# Patient Record
Sex: Female | Born: 1955
Health system: Southern US, Community
[De-identification: ages and names within clinical notes are randomized; demographics above are authoritative.]

## PROBLEM LIST (undated history)

## (undated) DIAGNOSIS — F419 Anxiety disorder, unspecified: Secondary | ICD-10-CM

## (undated) DIAGNOSIS — I5181 Takotsubo syndrome: Secondary | ICD-10-CM

## (undated) DIAGNOSIS — N200 Calculus of kidney: Secondary | ICD-10-CM

## (undated) DIAGNOSIS — N390 Urinary tract infection, site not specified: Secondary | ICD-10-CM

## (undated) DIAGNOSIS — I219 Acute myocardial infarction, unspecified: Secondary | ICD-10-CM

## (undated) DIAGNOSIS — C439 Malignant melanoma of skin, unspecified: Secondary | ICD-10-CM

## (undated) DIAGNOSIS — N393 Stress incontinence (female) (male): Secondary | ICD-10-CM

## (undated) DIAGNOSIS — J189 Pneumonia, unspecified organism: Secondary | ICD-10-CM

## (undated) DIAGNOSIS — M1990 Unspecified osteoarthritis, unspecified site: Secondary | ICD-10-CM

## (undated) DIAGNOSIS — Z720 Tobacco use: Secondary | ICD-10-CM

## (undated) DIAGNOSIS — M272 Inflammatory conditions of jaws: Secondary | ICD-10-CM

## (undated) HISTORY — PX: WRIST SURGERY: SHX841

## (undated) HISTORY — PX: ABDOMINAL HYSTERECTOMY: SHX81

## (undated) HISTORY — PX: CERVICAL DISC SURGERY: SHX588

## (undated) HISTORY — DX: Urinary tract infection, site not specified: N39.0

## (undated) HISTORY — PX: UMBILICAL HERNIA REPAIR: SHX196

## (undated) HISTORY — PX: BACK SURGERY: SHX140

## (undated) HISTORY — PX: NECK SURGERY: SHX720

## (undated) HISTORY — PX: SKIN CANCER EXCISION: SHX779

## (undated) HISTORY — PX: INGUINAL HERNIA REPAIR: SUR1180

## (undated) HISTORY — PX: LUMBAR FUSION: SHX111

## (undated) HISTORY — PX: OTHER SURGICAL HISTORY: SHX169

## (undated) HISTORY — PX: CARPAL TUNNEL RELEASE: SHX101

## (undated) HISTORY — DX: Calculus of kidney: N20.0

## (undated) HISTORY — PX: MELANOMA EXCISION: SHX5266

## (undated) HISTORY — PX: MANDIBLE SURGERY: SHX707

## (undated) HISTORY — DX: Acute myocardial infarction, unspecified: I21.9

## (undated) HISTORY — DX: Malignant melanoma of skin, unspecified: C43.9

## (undated) HISTORY — DX: Inflammatory conditions of jaws: M27.2

## (undated) HISTORY — DX: Stress incontinence (female) (male): N39.3

## (undated) HISTORY — DX: Unspecified osteoarthritis, unspecified site: M19.90

## (undated) HISTORY — DX: Takotsubo syndrome: I51.81

## (undated) HISTORY — PX: TRIGGER FINGER RELEASE: SHX641

## (undated) HISTORY — DX: Pneumonia, unspecified organism: J18.9

---

## 2006-03-15 ENCOUNTER — Encounter: Admission: RE | Admit: 2006-03-15 | Discharge: 2006-03-15 | Payer: Self-pay | Admitting: Orthopaedic Surgery

## 2008-02-01 ENCOUNTER — Encounter: Admission: RE | Admit: 2008-02-01 | Discharge: 2008-02-01 | Payer: Self-pay | Admitting: Orthopaedic Surgery

## 2008-04-01 ENCOUNTER — Encounter: Admission: RE | Admit: 2008-04-01 | Discharge: 2008-04-01 | Payer: Self-pay | Admitting: Orthopaedic Surgery

## 2009-06-12 ENCOUNTER — Encounter: Admission: RE | Admit: 2009-06-12 | Discharge: 2009-06-12 | Payer: Self-pay | Admitting: Anesthesiology

## 2010-03-14 HISTORY — PX: NOSE SURGERY: SHX723

## 2010-04-14 ENCOUNTER — Other Ambulatory Visit: Payer: Self-pay | Admitting: Orthopedic Surgery

## 2010-04-14 DIAGNOSIS — M542 Cervicalgia: Secondary | ICD-10-CM

## 2010-04-16 ENCOUNTER — Ambulatory Visit
Admission: RE | Admit: 2010-04-16 | Discharge: 2010-04-16 | Disposition: A | Discharge status: Home / Self Care | Payer: 59 | Source: Ambulatory Visit | Attending: Orthopedic Surgery | Admitting: Orthopedic Surgery

## 2010-04-16 DIAGNOSIS — M542 Cervicalgia: Secondary | ICD-10-CM

## 2010-04-16 MED ORDER — GADOBENATE DIMEGLUMINE 529 MG/ML IV SOLN
14.0000 mL | Freq: Once | INTRAVENOUS | Status: AC | PRN
  Administered 2010-04-16: 14 mL via INTRAVENOUS

## 2011-10-11 DIAGNOSIS — M542 Cervicalgia: Secondary | ICD-10-CM

## 2011-10-11 HISTORY — DX: Cervicalgia: M54.2

## 2011-11-10 DIAGNOSIS — Z8522 Personal history of malignant neoplasm of nasal cavities, middle ear, and accessory sinuses: Secondary | ICD-10-CM | POA: Insufficient documentation

## 2011-11-10 HISTORY — DX: Personal history of malignant neoplasm of nasal cavities, middle ear, and accessory sinuses: Z85.22

## 2013-05-21 ENCOUNTER — Encounter: Payer: Self-pay | Admitting: Neurology

## 2013-06-03 ENCOUNTER — Ambulatory Visit (INDEPENDENT_AMBULATORY_CARE_PROVIDER_SITE_OTHER): Payer: BC Managed Care – PPO | Admitting: Neurology

## 2013-06-03 ENCOUNTER — Encounter: Payer: Self-pay | Admitting: Neurology

## 2013-06-03 VITALS — BP 140/88 | HR 76 | Resp 14 | Ht 64.5 in | Wt 150.0 lb

## 2013-06-03 DIAGNOSIS — R251 Tremor, unspecified: Secondary | ICD-10-CM

## 2013-06-03 DIAGNOSIS — M6281 Muscle weakness (generalized): Secondary | ICD-10-CM

## 2013-06-03 DIAGNOSIS — R531 Weakness: Secondary | ICD-10-CM

## 2013-06-03 DIAGNOSIS — R0600 Dyspnea, unspecified: Secondary | ICD-10-CM

## 2013-06-03 DIAGNOSIS — R0609 Other forms of dyspnea: Secondary | ICD-10-CM

## 2013-06-03 DIAGNOSIS — H02409 Unspecified ptosis of unspecified eyelid: Secondary | ICD-10-CM

## 2013-06-03 DIAGNOSIS — R259 Unspecified abnormal involuntary movements: Secondary | ICD-10-CM

## 2013-06-03 DIAGNOSIS — R0989 Other specified symptoms and signs involving the circulatory and respiratory systems: Secondary | ICD-10-CM

## 2013-06-03 DIAGNOSIS — M542 Cervicalgia: Secondary | ICD-10-CM

## 2013-06-03 NOTE — Progress Notes (Signed)
Subjective:    Kathryn [name] was seen in consultation in the movement disorder clinic at the request of Dr. Hardin Negus, whom is the pts pain management physician.  Her PCP is PROCHNAU,CAROLINE, MD.  The evaluation is for tremor.  The patient is a 58 y.o. right handed female with c/o L side feels very weak.  She is noting that the L pointer finger and thumb seem to cramp.  It has been going on for 6 months. It happens more in the evening and nighttime.  If she carries something, she feels that her leg could give out.  She has had loss of balance and is noting that she doesn't feel comfortable climbing on a stool.  She feels very uncomfortable going down stairs but is able to go up them.  She notices that if she awakens in the middle of the night, she cannot get the R eye open for about 15 seconds.  No diplopia but has excessive blurry vision and is awaiting an eye appointment. She has some SOB but relates that to being out of shape.   She does have neck pain, for which she is seeing pain management.  The records that were made available to me were reviewed.  She reports some tremor as well.  She notices it with activation and with holding things.   There is no family hx of tremor.    Affected by caffeine:  no Affected by alcohol:  No drink alcohol x 10 years Affected by stress:  no (under significant amount of stress raising grandchild) Affected by fatigue:  no Spills soup if on spoon:  no Spills glass of liquid if full:  no Affects ADL's (tying shoes, brushing teeth, etc):  no  MRI Cervical spine from 04/17/10 reviewed:  MRI CERVICAL SPINE WITHOUT AND WITH CONTRAST  Technique: Multiplanar and multiecho pulse sequences of the  cervical spine, to include the craniocervical junction and  cervicothoracic junction, were obtained according to standard  protocol without and with intravenous contrast.  Contrast: 14 ml Multihance  Comparison: 06/12/2009. CT same day.  Findings: The foramen magnum is  widely patent. No abnormality is  seen at the C1-2 or C2-3 levels. C3-4 shows minimal bulging of the  disc but no narrowing of the canal or foramina. No facet  arthropathy.  There has been previous anterior cervical discectomy and fusion  from C4 through C7. The spinal canal appears sufficiently patent  through that region. No foraminal encroachment is identified.  At C7-T1, there is degeneration of the disc with mild bulging.  There is mild foraminal encroachment bilaterally because of  osteophytes. There is mild facet degeneration.  T1-2 shows minimal bulging of the disc and mild facet degeneration.  No stenosis. T2-3 shows a shallow disc protrusion that indents the  ventral subarachnoid space but does not effect the cord or show  foraminal extension.  IMPRESSION:  No abnormalities seen above the fusion segment.  Satisfactory appearance of the fusion region from C4-C7. Wide  patency of the canal and foramina. No evidence of complicating  feature.  Mild degenerative changes C7-T1 without evidence of neural  compression or abnormal edema / enhancement.  Shallow disc protrusion at T2-3 without apparent neural  compression.    Outside reports reviewed: historical medical records and referral letter/letters.  Allergies  Allergen Reactions  . Cephalosporins Rash    Current Outpatient Prescriptions on File Prior to Visit  Medication Sig Dispense Refill  . ALPRAZolam (XANAX) 0.5 MG tablet Take 0.5 mg by  mouth every 8 (eight) hours.      . Diclofenac Sodium (PENNSAID) 1.5 % SOLN Place 20-30 drops onto the skin 4 (four) times daily as needed.      Marland Kitchen estrogens-methylTEST 1.25-2.5 MG TABS per tablet Take 1 tablet by mouth daily.      . fentaNYL (DURAGESIC - DOSED MCG/HR) 50 MCG/HR Place 50 mcg onto the skin every other day.      . oxybutynin (DITROPAN) 5 MG tablet Take 5 mg by mouth 2 (two) times daily.      Marland Kitchen oxyCODONE (ROXICODONE) 15 MG immediate release tablet Take 15 mg by mouth  every 4 (four) hours as needed for pain.      . polyethylene glycol (MIRALAX / GLYCOLAX) packet Take 17 g by mouth 2 (two) times daily.      . Risedronate Sodium (ATELVIA) 35 MG TBEC Take 35 mg by mouth once a week.       No current facility-administered medications on file prior to visit.    Past Medical History  Diagnosis Date  . Stress incontinence     Past Surgical History  Procedure Laterality Date  . Abdominal hysterectomy      cervical cancer  . Mandible surgery    . Wrist surgery      History   Social History  . Marital Status: Married    Spouse Name: N/A    Number of Children: N/A  . Years of Education: N/A   Occupational History  . Not on file.   Social History Main Topics  . Smoking status: Never Smoker   . Smokeless tobacco: Not on file  . Alcohol Use: No  . Drug Use: No  . Sexual Activity: Not on file   Other Topics Concern  . Not on file   Social History Narrative  . No narrative on file    No family status information on file.    Review of Systems A complete 10 system ROS was obtained and was negative apart from what is mentioned.   Objective:   VITALS:   Filed Vitals:   06/03/13 1352  BP: 140/88  Pulse: 76  Resp: 14  Height: 5' 4.5" (1.638 m)  Weight: 150 lb (68.04 kg)   Gen:  Appears stated age and in NAD. HEENT:  Normocephalic, atraumatic. The mucous membranes are dry.  Well healed surgical scar/skin flap over the nose. The superficial temporal arteries are without ropiness or tenderness. Cardiovascular: Regular rate and rhythm. Lungs: Clear to auscultation bilaterally. Neck: There are no carotid bruits noted bilaterally.  NEUROLOGICAL:  Orientation:  The patient is alert and oriented x 3.  Recent and remote memory are intact.  Attention span and concentration are normal.  Able to name objects and repeat without trouble.  Fund of knowledge is appropriate Cranial nerves: There is good facial symmetry. The pupils are equal round  and reactive to light bilaterally. Fundoscopic exam reveals clear disc margins bilaterally. Extraocular muscles are intact and visual fields are full to confrontational testing.  Able to sustain prolonged upgaze without a problem.   Speech is fluent and clear. Able to sing ABC's all the way to letter "Q" before stopping for a breath.  Soft palate rises symmetrically and there is no tongue deviation. Hearing is intact to conversational tone. Tone: Tone is good throughout. Sensation: Sensation is intact to light touch and pinprick throughout (facial, trunk, extremities).  Slight decrease pinprick over the L L4-5 distribution but pt states that is chronic and stable.  Vibration is intact at the bilateral big toe. There is no extinction with double simultaneous stimulation. There is no sensory dermatomal level identified. Coordination:  The patient has no dysdiadichokinesia or dysmetria. Motor: Strength is 5/5 in the bilateral upper and lower extremities.  Shoulder shrug is equal bilaterally.  There is no pronator drift.  There are no fasciculations noted. DTR's: Deep tendon reflexes are 2/4 at the bilateral biceps, triceps, brachioradialis, patella and achilles.  Plantar responses are downgoing bilaterally. Gait and Station: The patient is able to ambulate without difficulty. The patient is able to heel toe walk without any difficulty. The patient is able to ambulate in a tandem fashion. The patient is able to stand in the Romberg position.  She has some minor trouble doing a deep squat and getting back up.  MOVEMENT EXAM: Tremor:  There is very minimal tremor in the UE, noted most significantly with action.   There is no tremor at rest.       Assessment/Plan:   1.  Perceived L sided weakness  -we will do an MRI brain and c-spine 2.  Ptosis by history (not seen)  -MG panel will be ordered  -If above negative, will proceed with EMG. 3.  Tremor.  -overall quite minor and non-debilitating.  Don't  think that this alone requires medication.  More concerned about the constellation of sx's that she describes, especially in light of fairly recent melanoma's.  That being said, neuro exam today was non-focal and non lateralizing.   4.  F/U after testing completed.

## 2013-06-03 NOTE — Patient Instructions (Addendum)
1. Your provider has requested that you have labwork completed today. Please go to [facility] on the first floor of this building before leaving the office today. 2. We have scheduled you at [facility] for your MRI on 06/21/13 at 1:00 pm. Please arrive 15 minutes prior and go to 1st floor radiology. If you need to reschedule for any reason please call [phone]. 3. We will call you with results of MR and base your follow up on these results. You may need to have an EMG.

## 2013-06-12 LAB — MYASTHENIA GRAVIS PANEL 2
ACETYLCHOLINE REC MOD AB: 8 %
Aceytlcholine Rec Bloc Ab: <15 % of inhibition (ref <15)

## 2013-06-18 ENCOUNTER — Telehealth: Payer: Self-pay | Admitting: Neurology

## 2013-06-18 NOTE — Telephone Encounter (Signed)
Please schedule for 60-min EMG of the left side. Thanks.

## 2013-06-18 NOTE — Telephone Encounter (Signed)
Patient made aware labs results normal, will proceed with EMG. Dr Posey Pronto- please review records and let us know how to schedule.

## 2013-06-18 NOTE — Telephone Encounter (Signed)
See previous documentation / Sherri  °

## 2013-06-21 ENCOUNTER — Ambulatory Visit (HOSPITAL_COMMUNITY)
Admission: RE | Admit: 2013-06-21 | Discharge: 2013-06-21 | Disposition: A | Discharge status: Home / Self Care | Payer: BC Managed Care – PPO | Source: Ambulatory Visit | Attending: Neurology | Admitting: Neurology

## 2013-06-21 ENCOUNTER — Ambulatory Visit (HOSPITAL_COMMUNITY): Admission: RE | Admit: 2013-06-21 | Payer: BC Managed Care – PPO | Source: Ambulatory Visit

## 2013-06-21 DIAGNOSIS — M542 Cervicalgia: Secondary | ICD-10-CM

## 2013-06-21 DIAGNOSIS — R259 Unspecified abnormal involuntary movements: Secondary | ICD-10-CM | POA: Insufficient documentation

## 2013-06-21 DIAGNOSIS — R531 Weakness: Secondary | ICD-10-CM

## 2013-06-21 DIAGNOSIS — R29898 Other symptoms and signs involving the musculoskeletal system: Secondary | ICD-10-CM | POA: Insufficient documentation

## 2013-06-21 DIAGNOSIS — R251 Tremor, unspecified: Secondary | ICD-10-CM

## 2013-06-21 MED ORDER — GADOBENATE DIMEGLUMINE 529 MG/ML IV SOLN
15.0000 mL | Freq: Once | INTRAVENOUS | Status: AC | PRN
  Administered 2013-06-21: 14 mL via INTRAVENOUS

## 2013-06-24 ENCOUNTER — Telehealth: Payer: Self-pay | Admitting: Neurology

## 2013-06-24 NOTE — Telephone Encounter (Signed)
Message copied by Annamaria Helling on Mon Jun 24, 2013 11:05 AM ------      Message from: TAT, [place]: Mon Jun 24, 2013  9:57 AM       Please let pt know that MRI brain looked normal and nothing on MRI c-spine to explain sx's.  Ask if she wants [place] to schedule EMG as previously discussed. ------

## 2013-06-24 NOTE — Telephone Encounter (Signed)
Pt returning your call

## 2013-06-24 NOTE — Telephone Encounter (Signed)
Left message on machine for patient to call back.

## 2013-06-24 NOTE — Telephone Encounter (Signed)
Patient made aware of MR results. She already has EMG scheduled for 08/08/2013.

## 2013-07-31 ENCOUNTER — Other Ambulatory Visit: Payer: Self-pay | Admitting: Neurology

## 2013-07-31 DIAGNOSIS — R251 Tremor, unspecified: Secondary | ICD-10-CM

## 2013-07-31 DIAGNOSIS — R531 Weakness: Secondary | ICD-10-CM

## 2013-07-31 DIAGNOSIS — H02409 Unspecified ptosis of unspecified eyelid: Secondary | ICD-10-CM

## 2013-08-08 ENCOUNTER — Ambulatory Visit (INDEPENDENT_AMBULATORY_CARE_PROVIDER_SITE_OTHER): Payer: BC Managed Care – PPO | Admitting: Neurology

## 2013-08-08 ENCOUNTER — Encounter: Payer: Self-pay | Admitting: Neurology

## 2013-08-08 DIAGNOSIS — G5620 Lesion of ulnar nerve, unspecified upper limb: Secondary | ICD-10-CM

## 2013-08-08 DIAGNOSIS — G5622 Lesion of ulnar nerve, left upper limb: Secondary | ICD-10-CM

## 2013-08-08 DIAGNOSIS — G5600 Carpal tunnel syndrome, unspecified upper limb: Secondary | ICD-10-CM

## 2013-08-08 DIAGNOSIS — IMO0002 Reserved for concepts with insufficient information to code with codable children: Secondary | ICD-10-CM

## 2013-08-08 DIAGNOSIS — M5417 Radiculopathy, lumbosacral region: Secondary | ICD-10-CM

## 2013-08-08 NOTE — Procedures (Signed)
[facility]  [address] [place], [address]  [place], [place] [address] Tel: [phone] Fax:  [phone] Test Date:  08/08/2013  Patient: Kathryn [name] DOB: May 17, 1955 Physician: Narda Amber, DO  Sex: Female Height: 5\' 5"  Ref Phys: Narda Amber  ID#: [id number] Temp:  34.0C Technician: Laureen Ochs R. NCS T.   Patient Complaints: This is a 58 year-old female presenting for evaluation of left sided weakness and ptosis.  NCV & EMG Findings: Extensive electrodiagnostic testing of the left upper extremity and lower extremity with additional studies of the right side reveals:  1. The left median sensory nerve showed prolonged distal peak latency (3.7 ms).  The ulnar, sural and superficial peroneal sensory responses are normal. 2. The left ulnar motor nerve showed decreased conduction velocity (A Elbow-B Elbow, 44 m/s).  Median, peroneal, and tibial motor responses are normal. 3. In the upper extremity, chronic motor axon loss changes are seen affecting the first dorsal interossues, abductor digiti minimi, abductor pollicis brevis, and flexor digitorum profundus 4&5 muscles affecting the left side.  Similar findings are not seen on the right. 4. In the lower extremities, chronic motor axon loss changes are seen affecting bilateral L5-myotomes.  There is no evidence of active denervation.  Impression: 1. Chronic left ulnar neuropathy across the elbow, demyelinating and axon loss in type, moderate in degree electrically. 2. Left median neuropathy at the wrist, consistent with the clinical diagnosis of carpal tunnel syndrome, mild in degree electrically. 3. Bilateral old L5 radiculopathy, mild in degree electrically. 4. There is no evidence of a cervical radiculopathy affecting the upper extremities.   ___________________________ Narda Amber, DO    Nerve Conduction Studies Anti Sensory Summary Table   Site NR Peak (ms) Norm Peak (ms) P-T Amp (V) Norm P-T Amp  Left Median Anti  Sensory (2nd Digit)  Wrist    3.7 <3.6 15.9 >15  Left Sup Peroneal Anti Sensory (Ant Lat Mall)  12 cm    2.9 <4.6 6.7 >4  Left Sural Anti Sensory (Lat Mall)  Calf    3.6 <4.6 8.9 >4  Left Ulnar Anti Sensory (5th Digit)  34C  Wrist    2.6 <3.1 26.4 >10   Motor Summary Table   Site NR Onset (ms) Norm Onset (ms) O-P Amp (mV) Norm O-P Amp Site1 Site2 Delta-0 (ms) Dist (cm) Vel (m/s) Norm Vel (m/s)  Left Median Motor (Abd Poll Brev)  34C  Wrist    3.5 <4.0 10.1 >6 Elbow Wrist 4.2 22.0 52 >50  Elbow    7.7  9.5         Left Peroneal Motor (Ext Dig Brev)  Ankle    4.0 <6.0 3.0 >2.5 B Fib Ankle 6.8 30.0 44 >40  B Fib    10.8  2.4  Poplt B Fib 1.9 11.0 58 >40  Poplt    12.7  2.9         Left Tibial Motor (Abd Hall Brev)  Ankle    3.1 <6.0 11.3 >4 Knee Ankle 8.9 36.0 40 >40  Knee    12.0  8.1         Left Ulnar Motor (Abd Dig Minimi)  34C  Wrist    2.5 <3.1 10.7 >7 B Elbow Wrist 3.4 22.0 65 >50  B Elbow    5.9  10.1  A Elbow B Elbow 1.6 7.0 44 >50  A Elbow    7.5  10.1          EMG  Side Muscle Ins Act Fibs Psw Fasc Number Recrt Dur Dur. Amp Amp. Poly Poly. Comment  Right Flex Dig Long Nml Nml Nml Nml 1- Mod-R Few 1+ Nml Nml Nml Nml N/A  Right Abd Poll Brev Nml Nml Nml Nml Nml Nml Nml Nml Nml Nml Nml Nml N/A  Right ABD Dig Min Nml Nml Nml Nml Nml Nml Nml Nml Nml Nml Nml Nml N/A  Right 1stDorInt Nml Nml Nml Nml Nml Nml Nml Nml Nml Nml Nml Nml N/A  Right Ext Indicis Nml Nml Nml Nml Nml Nml Nml Nml Nml Nml Nml Nml N/A  Right AntTibialis Nml Nml Nml Nml 1- Mod-R Few 1+ Few 1+ Few 1+ N/A  Right Gastroc Nml Nml Nml Nml Nml Nml Nml Nml Nml Nml Nml Nml N/A  Right FlexPolLong Nml Nml Nml Nml Nml Nml Nml Nml Nml Nml Nml Nml N/A  Left 1stDorInt Nml Nml Nml Nml 2- Mod-R Some 1+ Some 1+ Nml Nml N/A  Left RectFemoris Nml Nml Nml Nml Nml Nml Nml Nml Nml Nml Nml Nml N/A  Left BicepsFemS Nml Nml Nml Nml Nml Nml Nml Nml Nml Nml Nml Nml N/A  Left Gastroc Nml Nml Nml Nml Nml Nml Nml Nml Nml Nml Nml  Nml N/A  Left Flex Dig Long Nml Nml Nml Nml 1- Mod-R Few 1+ Few 1+ Nml Nml N/A  Left Abd Poll Brev Nml Nml Nml Nml 1- Mod Few 1+ Nml Nml Nml Nml N/A  Left FlexPolLong Nml Nml Nml Nml Nml Nml Nml Nml Nml Nml Nml Nml N/A  Left ABD Dig Min Nml Nml Nml Nml 1- Mod-R Few 1+ Nml Nml Few 1+ N/A  Left Biceps Nml Nml Nml Nml Nml Nml Nml Nml Nml Nml Nml Nml N/A  Left Triceps Nml Nml Nml Nml Nml Nml Nml Nml Nml Nml Nml Nml N/A  Left Deltoid Nml Nml Nml Nml Nml Nml Nml Nml Nml Nml Nml Nml N/A  Left GluteusMed Nml Nml Nml Nml Nml Nml Nml Nml Nml Nml Nml Nml N/A  Left PronatorTeres Nml Nml Nml Nml Nml Nml Nml Nml Nml Nml Nml Nml N/A  Left Ext Indicis Nml Nml Nml Nml Nml Nml Nml Nml Nml Nml Nml Nml N/A  Left AntTibialis Nml Nml Nml Nml 1- Mod-R Few 1+ Nml Nml Nml Nml N/A  Left FlexDigProf 4,5 Nml Nml Nml Nml 1- Mod-R Few 1+ Nml Nml Nml Nml N/A      Waveforms:

## 2013-08-08 NOTE — Progress Notes (Signed)
See procedure note for EMG results.  Taija Mathias K. Jenniferlynn Saad, DO  

## 2013-08-13 ENCOUNTER — Telehealth: Payer: Self-pay | Admitting: Neurology

## 2013-08-13 NOTE — Telephone Encounter (Signed)
I think all of patients tests are back if she would like to make quick f/u to review results.

## 2013-08-13 NOTE — Telephone Encounter (Signed)
Appt made for Friday to discuss results.

## 2013-08-16 ENCOUNTER — Encounter: Payer: Self-pay | Admitting: Neurology

## 2013-08-16 ENCOUNTER — Ambulatory Visit (INDEPENDENT_AMBULATORY_CARE_PROVIDER_SITE_OTHER): Payer: BC Managed Care – PPO | Admitting: Neurology

## 2013-08-16 VITALS — BP 136/80 | HR 60 | Resp 18 | Ht 64.5 in | Wt 148.0 lb

## 2013-08-16 DIAGNOSIS — G5620 Lesion of ulnar nerve, unspecified upper limb: Secondary | ICD-10-CM

## 2013-08-16 DIAGNOSIS — G5600 Carpal tunnel syndrome, unspecified upper limb: Secondary | ICD-10-CM

## 2013-08-16 MED ORDER — WRIST SPLINT/COCK-UP/LEFT M MISC
1.0000 | Freq: Every day | Status: DC
Start: 2013-08-16 — End: 2017-11-27

## 2013-08-16 NOTE — Progress Notes (Signed)
Subjective:    Kathryn [name] was seen in consultation in the movement disorder clinic at the request of Dr. Hardin Negus, whom is the pts pain management physician.  Her PCP is PROCHNAU,CAROLINE, MD.  The evaluation is for tremor.  The patient is a 58 y.o. right handed female with c/o L side feels very weak.  She is noting that the L pointer finger and thumb seem to cramp.  It has been going on for 6 months. It happens more in the evening and nighttime.  If she carries something, she feels that her leg could give out.  She has had loss of balance and is noting that she doesn't feel comfortable climbing on a stool.  She feels very uncomfortable going down stairs but is able to go up them.  She notices that if she awakens in the middle of the night, she cannot get the R eye open for about 15 seconds.  No diplopia but has excessive blurry vision and is awaiting an eye appointment. She has some SOB but relates that to being out of shape.   She does have neck pain, for which she is seeing pain management.  The records that were made available to me were reviewed.  She reports some tremor as well.  She notices it with activation and with holding things.   There is no family hx of tremor.    Affected by caffeine:  no Affected by alcohol:  No drink alcohol x 10 years Affected by stress:  no (under significant amount of stress raising grandchild) Affected by fatigue:  no Spills soup if on spoon:  no Spills glass of liquid if full:  no Affects ADL's (tying shoes, brushing teeth, etc):  no  08/16/13 update:  The patient returns today for testing results.  She had an MRI of the brain since last visit that was normal.  And the MRI of the cervical spine is below, but revealed stable fused segments, stable neural foraminal stenosis at C7-T1, progressive left facet hypertrophy and mild neural foraminal stenosis at C2-C3.  Acetylcholine receptor antibodies were negative.  EMG demonstrated a left ulnar neuropathy at the  elbow, overall moderate, left median neuropathy at the wrist, overall mild, and bilateral, old L5 radiculopathies, overall mild.  There was no evidence of cervical radiculopathy.  MRI Cervical spine from 06/21/13:  MRI CERVICAL SPINE WITHOUT AND WITH CONTRAST  TECHNIQUE:  Multiplanar, multiecho pulse sequences of the brain and surrounding  structures, and cervical spine, to include the craniocervical  junction and cervicothoracic junction, were obtained without and  with intravenous contrast.  CONTRAST: 57mL MULTIHANCE GADOBENATE DIMEGLUMINE 529 MG/ML IV SOLN  COMPARISON: CT of the cervical spine 04/16/2010. MRI of the  cervical spine 04/16/2010.  FINDINGS:  MRI HEAD FINDINGS  No acute infarct, hemorrhage, or mass lesion is present. No  significant white matter disease is present. The ventricles are of  normal size. No significant extraaxial fluid collection is present.  Flow is present in the major intracranial arteries. The globes and  orbits are intact. Mild mucosal thickening is present along the  floor of the left maxillary sinus.  The midbrain is unremarkable.  The postcontrast images demonstrate no pathologic enhancement.  MRI CERVICAL SPINE FINDINGS  Cervical spine is fused anteriorly at C4-5, C5-6, and C6-7. Normal  signal is present in the cervical and upper thoracic spinal cord to  the lowest imaged level, T3-4.  C2-3: Progressive left-sided facet hypertrophy results and mild left  foraminal narrowing. The central  canal is patent.  C3-4: Progressive left-sided facet hypertrophy and bilateral  uncovertebral spurring is present. Mild foraminal narrowing is  present bilaterally.  C4-5: The patient is fused. Mild right foraminal narrowing is  stable.  C5-6: The patient is fused. Osteophytic ridging and uncovertebral  spurring results and mild central and foraminal narrowing without  change.  C6-7: Patient is fused anteriorly. There is no residual stenosis.  C7-T1: Mild  left foraminal narrowing is due to uncovertebral  spurring without significant change.  IMPRESSION:  1. Normal MRI of the brain.  2. Stable fusion at C4-5, C5-6, and C6-7.  3. Progressive left-sided facet hypertrophy and mild left foraminal  narrowing at C2-3.  4. Progressive left-sided facet hypertrophy and bilateral  uncovertebral spurring with mild foraminal narrowing bilaterally at  C3-4.  5. Stable appearance of fused segments.  6. Mild left foraminal narrowing at C7-T1 is stable, secondary to  uncovertebral spurring.      Outside reports reviewed: historical medical records and referral letter/letters.  Allergies  Allergen Reactions  . Cephalosporins Rash    Current Outpatient Prescriptions on File Prior to Visit  Medication Sig Dispense Refill  . ALPRAZolam (XANAX) 0.5 MG tablet Take 0.5 mg by mouth at bedtime as needed.       Marland Kitchen estrogens-methylTEST 1.25-2.5 MG TABS per tablet Take 1 tablet by mouth daily.      . fentaNYL (DURAGESIC - DOSED MCG/HR) 50 MCG/HR Place 50 mcg onto the skin every other day.      . oxybutynin (DITROPAN) 5 MG tablet Take 5 mg by mouth 2 (two) times daily.      Marland Kitchen oxyCODONE (ROXICODONE) 15 MG immediate release tablet Take 15 mg by mouth every 4 (four) hours as needed for pain.      . polyethylene glycol (MIRALAX / GLYCOLAX) packet Take 17 g by mouth 2 (two) times daily.      . Risedronate Sodium (ATELVIA) 35 MG TBEC Take 35 mg by mouth once a week.      . TRAZODONE HCL PO Take by mouth at bedtime as needed.       No current facility-administered medications on file prior to visit.    Past Medical History  Diagnosis Date  . Stress incontinence     Past Surgical History  Procedure Laterality Date  . Abdominal hysterectomy      cervical cancer  . Mandible surgery      lower jaw  . Wrist surgery    . Back surgery      lumbar and cervical  . Melanoma excision      L arm and L leg, 2014  . Skin cancer excision      squamous cell cancer ,  2012  . Back skin flap      History   Social History  . Marital Status: Married    Spouse Name: N/A    Number of Children: N/A  . Years of Education: N/A   Occupational History  . Not on file.   Social History Main Topics  . Smoking status: Current Every Day Smoker -- 0.25 packs/day for 40 years    Types: Cigarettes  . Smokeless tobacco: Not on file  . Alcohol Use: No  . Drug Use: No  . Sexual Activity: Not on file   Other Topics Concern  . Not on file   Social History Narrative  . No narrative on file    Family Status  Relation Status Death Age  . Mother Alive  breast cancer, melanoma  . Father Alive     heart disease, diabetes  . Sister Alive     healthy  . Sister Alive     MS  . Son Alive     healthy  . Daughter Alive     healthy    Review of Systems A complete 10 system ROS was obtained and was negative apart from what is mentioned.   Objective:   VITALS:   Filed Vitals:   08/16/13 1014  BP: 136/80  Pulse: 60  Resp: 18  Height: 5' 4.5" (1.638 m)  Weight: 148 lb (67.132 kg)   This was 100% a counseling/testing results visit.  Gen:  Appears stated age and in NAD.   NEUROLOGICAL:  Orientation:  The patient is alert and oriented x 3.  Recent and remote memory are intact.  Attention span and concentration are normal.  Able to name objects and repeat without trouble.  Fund of knowledge is appropriate      Assessment/Plan:   1.  left ulnar neuropathy at the elbow, left median neuropathy at the wrist  -She wants no surgical intervention.  We talked about not leaning on the elbow.  She was given a cockup wrist splint prescription to at least wear a nightly basis for the median neuropathy. 2.  Degenerative changes in the cervical spine, without EMG evidence of cervical radiculopathy.  -Continue with pain management.  Physical therapy may be of value.  -No evidence of myasthenia gravis on lab work.  No evidence of neurodegen d/o like PD (pt  worried about this) 3.  followup as needed.

## 2014-05-07 ENCOUNTER — Telehealth: Payer: Self-pay | Admitting: Neurology

## 2014-05-07 NOTE — Telephone Encounter (Signed)
Received medical records request from [facility] and forwarded to medical records.

## 2015-08-04 DIAGNOSIS — R2231 Localized swelling, mass and lump, right upper limb: Secondary | ICD-10-CM

## 2015-08-04 HISTORY — DX: Localized swelling, mass and lump, right upper limb: R22.31

## 2016-09-30 DIAGNOSIS — R079 Chest pain, unspecified: Secondary | ICD-10-CM

## 2017-10-12 DIAGNOSIS — I219 Acute myocardial infarction, unspecified: Secondary | ICD-10-CM | POA: Insufficient documentation

## 2017-10-12 HISTORY — DX: Acute myocardial infarction, unspecified: I21.9

## 2017-10-29 ENCOUNTER — Inpatient Hospital Stay (HOSPITAL_COMMUNITY)
Admission: AD | Admit: 2017-10-29 | Discharge: 2017-11-02 | DRG: 282 | Disposition: A | Discharge status: Home / Self Care | Payer: 59 | Source: Other Acute Inpatient Hospital | Attending: Cardiology | Admitting: Cardiology

## 2017-10-29 DIAGNOSIS — I21A1 Myocardial infarction type 2: Secondary | ICD-10-CM | POA: Diagnosis not present

## 2017-10-29 DIAGNOSIS — Z881 Allergy status to other antibiotic agents status: Secondary | ICD-10-CM

## 2017-10-29 DIAGNOSIS — Z85828 Personal history of other malignant neoplasm of skin: Secondary | ICD-10-CM

## 2017-10-29 DIAGNOSIS — I5181 Takotsubo syndrome: Secondary | ICD-10-CM | POA: Diagnosis not present

## 2017-10-29 DIAGNOSIS — G893 Neoplasm related pain (acute) (chronic): Secondary | ICD-10-CM | POA: Diagnosis present

## 2017-10-29 DIAGNOSIS — I214 Non-ST elevation (NSTEMI) myocardial infarction: Secondary | ICD-10-CM

## 2017-10-29 DIAGNOSIS — Z79899 Other long term (current) drug therapy: Secondary | ICD-10-CM

## 2017-10-29 DIAGNOSIS — F17210 Nicotine dependence, cigarettes, uncomplicated: Secondary | ICD-10-CM | POA: Diagnosis not present

## 2017-10-29 DIAGNOSIS — I200 Unstable angina: Secondary | ICD-10-CM | POA: Diagnosis present

## 2017-10-29 DIAGNOSIS — G8929 Other chronic pain: Secondary | ICD-10-CM

## 2017-10-29 DIAGNOSIS — I4581 Long QT syndrome: Secondary | ICD-10-CM | POA: Diagnosis not present

## 2017-10-29 DIAGNOSIS — R0600 Dyspnea, unspecified: Secondary | ICD-10-CM

## 2017-10-29 HISTORY — DX: Tobacco use: Z72.0

## 2017-10-29 HISTORY — DX: Anxiety disorder, unspecified: F41.9

## 2017-10-29 NOTE — H&P (Signed)
CARDIOLOGY H&P  HPI: Kathryn [name] is a 62 y.o. female w/ history of SCC of the nose, anxiety, smoking, and chronic pain who presents with chest pain.   Patient describes central, non-radiating chest pain that has been waxing and waning for the past several days. She typically takes Tums for her pain, which give her some relief. This time however Tums did not help. Her pain worsened today thus she came to an OSH ED for evaluation. In the ED her vital signs were within normal limits. Her troponin was elevated initially and rising on subsequent draw. She was started on a heparin drip with concern for ACS and transferred to [facility] for further evaluation.   Review of Systems:     Cardiac Review of Systems: {Y] = yes [ ]  = no  Chest Pain [  Y  ]  Resting SOB [   ] Exertional SOB  [  ]  Orthopnea [  ]   Pedal Edema [   ]    Palpitations [  ] Syncope  [  ]   Presyncope [   ]  General Review of Systems: [Y] = yes [  ]=no Constitional: recent weight change [  ]; anorexia [  ]; fatigue [  ]; nausea [  ]; night sweats [  ]; fever [  ]; or chills [  ];                                                                     Dental: poor dentition[  ];   Eye : blurred vision [  ]; diplopia [   ]; vision changes [  ];  Amaurosis fugax[  ]; Resp: cough [  ];  wheezing[  ];  hemoptysis[  ]; shortness of breath[  ]; paroxysmal nocturnal dyspnea[  ]; dyspnea on exertion[  ]; or orthopnea[  ];  GI:  gallstones[  ], vomiting[  ];  dysphagia[  ]; melena[  ];  hematochezia [  ]; heartburn[  ];   GU: kidney stones [  ]; hematuria[  ];   dysuria [  ];  nocturia[  ];               Skin: rash [  ], swelling[  ];, hair loss[  ];  peripheral edema[  ];  or itching[  ]; Musculosketetal: myalgias[  ];  joint swelling[  ];  joint erythema[  ];  joint pain[  ];  back pain[  ];  Heme/Lymph: bruising[  ];  bleeding[  ];  anemia[  ];  Neuro: TIA[  ];  headaches[  ];  stroke[  ];  vertigo[  ];  seizures[  ];   paresthesias[  ];   difficulty walking[  ];  Psych:depression[  ]; anxiety[  ];  Endocrine: diabetes[  ];  thyroid dysfunction[  ];  Other:  Past Medical History:  Diagnosis Date  . Stress incontinence     Prior to Admission medications   Medication Sig Start Date End Date Taking? Authorizing Provider  ALPRAZolam Duanne Moron) 0.5 MG tablet Take 0.5 mg by mouth at bedtime as needed.     [provider]  Elastic Bandages & Supports (WRIST SPLINT/COCK-UP/LEFT M) MISC 1 Device by Does not  apply route daily. 08/16/13   Tat, Eustace Quail, DO  estrogens-methylTEST 1.25-2.5 MG TABS per tablet Take 1 tablet by mouth daily.    [provider]  fentaNYL (DURAGESIC - DOSED MCG/HR) 50 MCG/HR Place 50 mcg onto the skin every other day.    [provider]  oxybutynin (DITROPAN) 5 MG tablet Take 5 mg by mouth 2 (two) times daily.    [provider]  oxyCODONE (ROXICODONE) 15 MG immediate release tablet Take 15 mg by mouth every 4 (four) hours as needed for pain.    [provider]  polyethylene glycol (MIRALAX / GLYCOLAX) packet Take 17 g by mouth 2 (two) times daily.    [provider]  RESTASIS 0.05 % ophthalmic emulsion Place 1 drop into both eyes 2 (two) times daily.  08/04/13   [provider]  Risedronate Sodium (ATELVIA) 35 MG TBEC Take 35 mg by mouth once a week.    [provider]  TRAZODONE HCL PO Take by mouth at bedtime as needed.    [provider]    Allergies  Allergen Reactions  . Cephalosporins Rash    Social History   Socioeconomic History  . Marital status: Married    Spouse name: Not on file  . Number of children: Not on file  . Years of education: Not on file  . Highest education level: Not on file  Occupational History  . Not on file  Social Needs  . Financial resource strain: Not on file  . Food insecurity:    Worry: Not on file    Inability: Not on file  . Transportation needs:    Medical: Not on file     Non-medical: Not on file  Tobacco Use  . Smoking status: Current Every Day Smoker    Packs/day: 0.25    Years: 40.00    Pack years: 10.00    Types: Cigarettes  Substance and Sexual Activity  . Alcohol use: No  . Drug use: No  . Sexual activity: Not on file  Lifestyle  . Physical activity:    Days per week: Not on file    Minutes per session: Not on file  . Stress: Not on file  Relationships  . Social connections:    Talks on phone: Not on file    Gets together: Not on file    Attends religious service: Not on file    Active member of club or organization: Not on file    Attends meetings of clubs or organizations: Not on file    Relationship status: Not on file  . Intimate partner violence:    Fear of current or ex partner: Not on file    Emotionally abused: Not on file    Physically abused: Not on file    Forced sexual activity: Not on file  Other Topics Concern  . Not on file  Social History Narrative  . Not on file    No family history on file.  PHYSICAL EXAM: Vitals:   10/29/17 2309  BP: (!) 137/101  Pulse: 86  Resp: 18  Temp: 98.2 F (36.8 C)   General:  NAD, very anxious, thin, frail HEENT: normal Neck: supple. no JVD. Carotids 2+ bilat; no bruits. No lymphadenopathy or thryomegaly appreciated. Cor: PMI nondisplaced. Regular rate & rhythm. No rubs, gallops or murmurs. Lungs: clear Abdomen: soft, nontender, nondistended. No hepatosplenomegaly. No bruits or masses. Good bowel sounds. Extremities: no cyanosis, clubbing, rash, edema Neuro: alert & oriented x 3,  cranial nerves grossly intact. moves all 4 extremities w/o difficulty. Affect pleasant.  ECG: admission ECG pending  No results found for this or any previous visit (from the past 24 hour(s)). No results found.   ASSESSMENT: Kathryn [name] is a 62 y.o. female w/ history of SCC of the nose, anxiety, smoking, and chronic pain who presents with chest pain.   PLAN/DISCUSSION: #) NSTEMI - repeat  troponin q6h x 2 - NPO for cath in AM - check lipids, A1c - ASA 324mg  then 81mg  daily - heparin drip for ACS per pharmacy protocol - atorvastatin 80mg  QHS - echo in AM - SLN, nitro gtt PRN  #) Meds: patient on multiple narcotic analgesics for cancer related pain - pharmacy consult for med rec  Marcie Mowers, MD Cardiology Fellow, PGY-6

## 2017-10-30 ENCOUNTER — Other Ambulatory Visit: Payer: Self-pay

## 2017-10-30 ENCOUNTER — Encounter (HOSPITAL_COMMUNITY): Payer: Self-pay | Admitting: Cardiology

## 2017-10-30 ENCOUNTER — Inpatient Hospital Stay (HOSPITAL_COMMUNITY): Admission: AD | Disposition: A | Discharge status: Home / Self Care | Payer: Self-pay | Attending: Cardiology

## 2017-10-30 ENCOUNTER — Observation Stay (HOSPITAL_BASED_OUTPATIENT_CLINIC_OR_DEPARTMENT_OTHER): Payer: 59

## 2017-10-30 DIAGNOSIS — I5181 Takotsubo syndrome: Secondary | ICD-10-CM

## 2017-10-30 DIAGNOSIS — I5030 Unspecified diastolic (congestive) heart failure: Secondary | ICD-10-CM | POA: Diagnosis not present

## 2017-10-30 DIAGNOSIS — I2510 Atherosclerotic heart disease of native coronary artery without angina pectoris: Secondary | ICD-10-CM | POA: Diagnosis not present

## 2017-10-30 HISTORY — PX: LEFT HEART CATH AND CORONARY ANGIOGRAPHY: CATH118249

## 2017-10-30 LAB — BASIC METABOLIC PANEL
ANION GAP: 11 (ref 5–15)
BUN: 9 mg/dL (ref 8–23)
CALCIUM: 9.8 mg/dL (ref 8.9–10.3)
CO2: 25 mmol/L (ref 22–32)
Chloride: 105 mmol/L (ref 98–111)
Creatinine, Ser: 0.82 mg/dL (ref 0.44–1.00)
GFR calc Af Amer: >60 mL/min (ref >60)
GLUCOSE: 93 mg/dL (ref 70–99)
Potassium: 4.1 mmol/L (ref 3.5–5.1)
Sodium: 141 mmol/L (ref 135–145)

## 2017-10-30 LAB — LIPID PANEL
CHOL/HDL RATIO: 2 ratio
CHOLESTEROL: 138 mg/dL (ref 0–200)
HDL: 69 mg/dL (ref >40)
LDL CALC: 64 mg/dL (ref 0–99)
TRIGLYCERIDES: 25 mg/dL (ref <150)
VLDL: 5 mg/dL (ref 0–40)

## 2017-10-30 LAB — CBC
HCT: 44.1 % (ref 36.0–46.0)
HEMOGLOBIN: 14.8 g/dL (ref 12.0–15.0)
MCH: 34.1 pg — ABNORMAL HIGH (ref 26.0–34.0)
MCHC: 33.6 g/dL (ref 30.0–36.0)
MCV: 101.6 fL — ABNORMAL HIGH (ref 78.0–100.0)
Platelets: 368 10*3/uL (ref 150–400)
RBC: 4.34 MIL/uL (ref 3.87–5.11)
RDW: 13 % (ref 11.5–15.5)
WBC: 15 10*3/uL — ABNORMAL HIGH (ref 4.0–10.5)

## 2017-10-30 LAB — ECHOCARDIOGRAM COMPLETE
HEIGHTINCHES: 64.5 in
Weight: 2045.87 oz

## 2017-10-30 LAB — TROPONIN I
TROPONIN I: 2.29 ng/mL — AB (ref <0.03)
TROPONIN I: 0.96 ng/mL — AB (ref <0.03)
Troponin I: 0.81 ng/mL (ref <0.03)

## 2017-10-30 LAB — HEPATIC FUNCTION PANEL
ALT: 21 U/L (ref 0–44)
AST: 37 U/L (ref 15–41)
Albumin: 4.4 g/dL (ref 3.5–5.0)
Alkaline Phosphatase: 42 U/L (ref 38–126)
BILIRUBIN DIRECT: 0.3 mg/dL — AB (ref 0.0–0.2)
BILIRUBIN TOTAL: 1.3 mg/dL — AB (ref 0.3–1.2)
Indirect Bilirubin: 1 mg/dL — ABNORMAL HIGH (ref 0.3–0.9)
Total Protein: 7.3 g/dL (ref 6.5–8.1)

## 2017-10-30 LAB — HEPARIN LEVEL (UNFRACTIONATED)
Heparin Unfractionated: 0.45 IU/mL (ref 0.30–0.70)
Heparin Unfractionated: 0.12 IU/mL — ABNORMAL LOW (ref 0.30–0.70)

## 2017-10-30 LAB — HIV ANTIBODY (ROUTINE TESTING W REFLEX): HIV Screen 4th Generation wRfx: NONREACTIVE

## 2017-10-30 LAB — TSH: TSH: 1.123 u[IU]/mL (ref 0.350–4.500)

## 2017-10-30 LAB — PROTIME-INR
INR: 1.06
Prothrombin Time: 13.7 seconds (ref 11.4–15.2)

## 2017-10-30 LAB — BRAIN NATRIURETIC PEPTIDE: B NATRIURETIC PEPTIDE 5: 300.7 pg/mL — AB (ref 0.0–100.0)

## 2017-10-30 LAB — HEMOGLOBIN A1C
Hgb A1c MFr Bld: 5.2 % (ref 4.8–5.6)
Mean Plasma Glucose: 102.54 mg/dL

## 2017-10-30 LAB — MRSA PCR SCREENING: MRSA by PCR: NEGATIVE

## 2017-10-30 SURGERY — LEFT HEART CATH AND CORONARY ANGIOGRAPHY
Anesthesia: LOCAL

## 2017-10-30 MED ORDER — ACETAMINOPHEN 325 MG PO TABS
650.0000 mg | ORAL_TABLET | ORAL | Status: DC | PRN
  Administered 2017-10-30 – 2017-11-01 (×4): 650 mg via ORAL
  Filled 2017-10-30 (×4): qty 2

## 2017-10-30 MED ORDER — MORPHINE SULFATE (PF) 10 MG/ML IV SOLN
2.0000 mg | INTRAVENOUS | Status: DC | PRN
  Administered 2017-10-30 (×3): 2 mg via INTRAVENOUS

## 2017-10-30 MED ORDER — MIDAZOLAM HCL 2 MG/2ML IJ SOLN
INTRAMUSCULAR | Status: AC
  Filled 2017-10-30: qty 2

## 2017-10-30 MED ORDER — SODIUM CHLORIDE 0.9 % IV SOLN: 250.0000 mL | INTRAVENOUS | Status: DC | PRN

## 2017-10-30 MED ORDER — NITROGLYCERIN 1 MG/10 ML FOR IR/CATH LAB
INTRA_ARTERIAL | Status: AC
  Filled 2017-10-30: qty 10

## 2017-10-30 MED ORDER — ALPRAZOLAM 0.25 MG PO TABS
0.2500 mg | ORAL_TABLET | Freq: Two times a day (BID) | ORAL | Status: DC | PRN
  Administered 2017-10-30: 0.25 mg via ORAL
  Filled 2017-10-30: qty 1

## 2017-10-30 MED ORDER — ASPIRIN 300 MG RE SUPP: 300.0000 mg | RECTAL | Status: AC

## 2017-10-30 MED ORDER — ATORVASTATIN CALCIUM 80 MG PO TABS
80.0000 mg | ORAL_TABLET | Freq: Every day | ORAL | Status: DC
Start: 2017-10-30 — End: 2017-11-02
  Administered 2017-10-30 – 2017-11-01 (×3): 80 mg via ORAL
  Filled 2017-10-30 (×3): qty 1

## 2017-10-30 MED ORDER — ASPIRIN EC 81 MG PO TBEC: 81.0000 mg | DELAYED_RELEASE_TABLET | Freq: Every day | ORAL | Status: DC

## 2017-10-30 MED ORDER — HEPARIN (PORCINE) IN NACL 100-0.45 UNIT/ML-% IJ SOLN
850.0000 [IU]/h | INTRAMUSCULAR | Status: DC
  Administered 2017-10-30: 850 [IU]/h via INTRAVENOUS
  Filled 2017-10-30: qty 250

## 2017-10-30 MED ORDER — SODIUM CHLORIDE 0.9 % WEIGHT BASED INFUSION: 3.0000 mL/kg/h | INTRAVENOUS | Status: DC

## 2017-10-30 MED ORDER — CARVEDILOL 3.125 MG PO TABS
3.1250 mg | ORAL_TABLET | Freq: Two times a day (BID) | ORAL | Status: DC
  Administered 2017-10-30: 3.125 mg via ORAL
  Filled 2017-10-30: qty 1

## 2017-10-30 MED ORDER — MORPHINE SULFATE (PF) 4 MG/ML IV SOLN
4.0000 mg | Freq: Once | INTRAVENOUS | Status: AC
  Administered 2017-10-30: 4 mg via INTRAVENOUS
  Filled 2017-10-30: qty 1

## 2017-10-30 MED ORDER — LIDOCAINE HCL (PF) 1 % IJ SOLN
INTRAMUSCULAR | Status: AC
  Filled 2017-10-30: qty 30

## 2017-10-30 MED ORDER — MORPHINE SULFATE (PF) 4 MG/ML IV SOLN
INTRAVENOUS | Status: AC
  Filled 2017-10-30: qty 1

## 2017-10-30 MED ORDER — MORPHINE SULFATE ER 15 MG PO TBCR
15.0000 mg | EXTENDED_RELEASE_TABLET | Freq: Two times a day (BID) | ORAL | Status: DC
  Administered 2017-10-30 – 2017-11-02 (×5): 15 mg via ORAL
  Filled 2017-10-30 (×5): qty 1

## 2017-10-30 MED ORDER — IOPAMIDOL (ISOVUE-370) INJECTION 76%
INTRAVENOUS | Status: AC
  Filled 2017-10-30: qty 100

## 2017-10-30 MED ORDER — NITROGLYCERIN 0.4 MG SL SUBL: 0.4000 mg | SUBLINGUAL_TABLET | SUBLINGUAL | Status: DC | PRN

## 2017-10-30 MED ORDER — ATORVASTATIN CALCIUM 80 MG PO TABS: 80.0000 mg | ORAL_TABLET | Freq: Every day | ORAL | Status: DC

## 2017-10-30 MED ORDER — OXYCODONE HCL 5 MG PO TABS
10.0000 mg | ORAL_TABLET | ORAL | Status: DC | PRN
  Administered 2017-10-30 – 2017-11-02 (×5): 10 mg via ORAL
  Filled 2017-10-30 (×4): qty 2

## 2017-10-30 MED ORDER — MIDAZOLAM HCL 2 MG/2ML IJ SOLN
INTRAMUSCULAR | Status: DC | PRN
  Administered 2017-10-30: 1 mg via INTRAVENOUS

## 2017-10-30 MED ORDER — VERAPAMIL HCL 2.5 MG/ML IV SOLN
INTRAVENOUS | Status: DC | PRN
  Administered 2017-10-30: 10 mL via INTRA_ARTERIAL

## 2017-10-30 MED ORDER — LIDOCAINE HCL (PF) 1 % IJ SOLN
INTRAMUSCULAR | Status: DC | PRN
  Administered 2017-10-30: 2 mL via INTRADERMAL

## 2017-10-30 MED ORDER — FENTANYL CITRATE (PF) 100 MCG/2ML IJ SOLN
INTRAMUSCULAR | Status: DC | PRN
  Administered 2017-10-30: 25 ug via INTRAVENOUS

## 2017-10-30 MED ORDER — FENTANYL CITRATE (PF) 100 MCG/2ML IJ SOLN
INTRAMUSCULAR | Status: AC
  Filled 2017-10-30: qty 2

## 2017-10-30 MED ORDER — SODIUM CHLORIDE 0.9% FLUSH: 3.0000 mL | INTRAVENOUS | Status: DC | PRN

## 2017-10-30 MED ORDER — SODIUM CHLORIDE 0.9% FLUSH
3.0000 mL | Freq: Two times a day (BID) | INTRAVENOUS | Status: DC
  Administered 2017-10-30 – 2017-11-01 (×5): 3 mL via INTRAVENOUS

## 2017-10-30 MED ORDER — SODIUM CHLORIDE 0.9 % IV SOLN
INTRAVENOUS | Status: AC
  Administered 2017-10-30: 10:00:00 via INTRAVENOUS

## 2017-10-30 MED ORDER — OXYCODONE HCL 5 MG PO TABS
ORAL_TABLET | ORAL | Status: AC
  Filled 2017-10-30: qty 2

## 2017-10-30 MED ORDER — HEPARIN SODIUM (PORCINE) 1000 UNIT/ML IJ SOLN
INTRAMUSCULAR | Status: AC
  Filled 2017-10-30: qty 1

## 2017-10-30 MED ORDER — ASPIRIN 81 MG PO CHEW
81.0000 mg | CHEWABLE_TABLET | Freq: Every day | ORAL | Status: DC
  Administered 2017-10-31 – 2017-11-02 (×3): 81 mg via ORAL
  Filled 2017-10-30 (×3): qty 1

## 2017-10-30 MED ORDER — ASPIRIN 81 MG PO CHEW
324.0000 mg | CHEWABLE_TABLET | ORAL | Status: AC
  Administered 2017-10-30: 324 mg via ORAL
  Filled 2017-10-30: qty 4

## 2017-10-30 MED ORDER — ONDANSETRON HCL 4 MG/2ML IJ SOLN: 4.0000 mg | Freq: Four times a day (QID) | INTRAMUSCULAR | Status: DC | PRN

## 2017-10-30 MED ORDER — IOPAMIDOL (ISOVUE-370) INJECTION 76%
INTRAVENOUS | Status: DC | PRN
  Administered 2017-10-30: 70 mL

## 2017-10-30 MED ORDER — HEPARIN (PORCINE) IN NACL 1000-0.9 UT/500ML-% IV SOLN
INTRAVENOUS | Status: DC | PRN
  Administered 2017-10-30 (×2): 500 mL

## 2017-10-30 MED ORDER — SODIUM CHLORIDE 0.9 % WEIGHT BASED INFUSION: 1.0000 mL/kg/h | INTRAVENOUS | Status: DC

## 2017-10-30 MED ORDER — HEPARIN SODIUM (PORCINE) 1000 UNIT/ML IJ SOLN
INTRAMUSCULAR | Status: DC | PRN
  Administered 2017-10-30: 3000 [IU] via INTRAVENOUS

## 2017-10-30 MED ORDER — ACETAMINOPHEN 325 MG PO TABS
650.0000 mg | ORAL_TABLET | ORAL | Status: DC | PRN
  Administered 2017-10-30: 650 mg via ORAL
  Filled 2017-10-30: qty 2

## 2017-10-30 MED ORDER — VERAPAMIL HCL 2.5 MG/ML IV SOLN
INTRAVENOUS | Status: AC
  Filled 2017-10-30: qty 2

## 2017-10-30 MED ORDER — HEPARIN (PORCINE) IN NACL 100-0.45 UNIT/ML-% IJ SOLN
950.0000 [IU]/h | INTRAMUSCULAR | Status: DC
  Administered 2017-10-30: 850 [IU]/h via INTRAVENOUS
  Filled 2017-10-30: qty 250

## 2017-10-30 MED ORDER — METOPROLOL TARTRATE 12.5 MG HALF TABLET: 12.5000 mg | ORAL_TABLET | Freq: Two times a day (BID) | ORAL | Status: DC

## 2017-10-30 MED ORDER — HYDROMORPHONE HCL 1 MG/ML IJ SOLN
INTRAMUSCULAR | Status: AC
  Filled 2017-10-30: qty 0.5

## 2017-10-30 MED ORDER — HYDROMORPHONE HCL 1 MG/ML IJ SOLN
0.5000 mg | INTRAMUSCULAR | Status: DC | PRN
  Administered 2017-10-30 – 2017-10-31 (×3): 0.5 mg via INTRAVENOUS
  Filled 2017-10-30 (×2): qty 0.5

## 2017-10-30 MED ORDER — HEPARIN (PORCINE) IN NACL 1000-0.9 UT/500ML-% IV SOLN
INTRAVENOUS | Status: AC
  Filled 2017-10-30: qty 1000

## 2017-10-30 SURGICAL SUPPLY — 14 items
CATH INFINITI 5 FR STR PIGTAIL (CATHETERS) IMPLANT
CATH INFINITI 5FR ANG PIGTAIL (CATHETERS) ×2 IMPLANT
CATH INFINITI 6F MPA2 100CM (CATHETERS) IMPLANT
CATH OPTITORQUE TIG 4.0 5F (CATHETERS) ×2 IMPLANT
DEVICE RAD TR BAND REGULAR (VASCULAR PRODUCTS) ×2 IMPLANT
GLIDESHEATH SLEND A-KIT 6F 22G (SHEATH) ×2 IMPLANT
GUIDEWIRE INQWIRE 1.5J.035X260 (WIRE) ×1 IMPLANT
INQWIRE 1.5J .035X260CM (WIRE) ×2
KIT HEART LEFT (KITS) ×2 IMPLANT
PACK CARDIAC CATHETERIZATION (CUSTOM PROCEDURE TRAY) ×2 IMPLANT
SYR MEDRAD MARK V 150ML (SYRINGE) ×2 IMPLANT
TRANSDUCER W/STOPCOCK (MISCELLANEOUS) ×2 IMPLANT
TUBING CIL FLEX 10 FLL-RA (TUBING) ×2 IMPLANT
WIRE HI TORQ VERSACORE-J 145CM (WIRE) ×2 IMPLANT

## 2017-10-30 NOTE — Progress Notes (Signed)
C/O chest pain. States she just can't get comfortable; feels like she is laying on a board. Turned to left side. Medicated for pain. Added O2 2L [place]. Husband in room

## 2017-10-30 NOTE — Progress Notes (Signed)
  Echocardiogram 2D Echocardiogram has been performed.  Kathryn [name] 10/30/2017, 4:33 PM

## 2017-10-30 NOTE — Progress Notes (Signed)
Medicated for "8/10 Chest pain; called to [facility] NP made aware- no change in orders recevied

## 2017-10-30 NOTE — Progress Notes (Signed)
Spoke w/Dr. Gwenlyn Found regarding patient's continued chest pain after MSO4 and oxycodone. Order received.

## 2017-10-30 NOTE — Progress Notes (Signed)
TR BAND REMOVAL  LOCATION:    Radial right rdial  DEFLATED PER PROTOCOL:   yes  TIME BAND OFF / DRESSING APPLIED:    1345/ gauze and tegaderm  SITE UPON ARRIVAL:    Level 0  SITE AFTER BAND REMOVAL:    Level 0  CIRCULATION SENSATION AND MOVEMENT:    Within Normal Limits :  Yes, rt hand and fingers warm and pink, sensation present; rt arm resting on pillow  COMMENTS:

## 2017-10-30 NOTE — Progress Notes (Signed)
ANTICOAGULATION CONSULT NOTE - Initial Consult  Pharmacy Consult for Heparin Indication: chest pain/ACS  Allergies  Allergen Reactions  . Cephalosporins Rash    Patient Measurements: Height: 5\' 4"  (162.6 cm) Weight: 126 lb 9.6 oz (57.4 kg)(Scale A) IBW/kg (Calculated) : 54.7  Vital Signs: Temp: 98.2 F (36.8 C) (08/18 2309) Temp Source: Oral (08/18 2309) BP: 137/101 (08/18 2309) Pulse Rate: 86 (08/18 2309)  Labs (at [facility]): WBC  9.4 Hgb  14.1 Hct  41.4 Plt  348  SCr  0.7  Recent Labs    10/30/17 0227  HGB 14.8  HCT 44.1  PLT 368  LABPROT 13.7  INR 1.06  HEPARINUNFRC 0.45    CrCl cannot be calculated (No successful lab value found.).   Medical History: Past Medical History:  Diagnosis Date  . Stress incontinence     Medications:  Xanax  Estratest  Porgesterone  MS Contin  OxyIR  Trazodone  Assessment: 62 y.o. female with chest pain for heparin .  Heparin 4000 units IV bolus, 850 units/hr started at 1700 at [facility].  Initial heparin level within goal range  Goal of Therapy:  Heparin level 0.3-0.7 units/ml Monitor platelets by anticoagulation protocol: Yes   Plan:  Continue Heparin at current rate  Follow up after cath today   Caryl Pina 10/30/2017,3:16 AM

## 2017-10-30 NOTE — Progress Notes (Signed)
[place] for Heparin Indication: chest pain/ACS  Allergies  Allergen Reactions  . Cephalosporins Rash    Patient Measurements: Height: 5\' 4"  (162.6 cm) Weight: 126 lb 9.6 oz (57.4 kg)(Scale A) IBW/kg (Calculated) : 54.7  Vital Signs: Temp: 98.4 F (36.9 C) (08/19 0536) Temp Source: Oral (08/19 0536) BP: 133/97 (08/19 1122) Pulse Rate: 87 (08/19 1122)  Labs (at [facility]): WBC  9.4 Hgb  14.1 Hct  41.4 Plt  348  SCr  0.7  Recent Labs    10/30/17 0227  HGB 14.8  HCT 44.1  PLT 368  LABPROT 13.7  INR 1.06  HEPARINUNFRC 0.45  CREATININE 0.82  TROPONINI 2.29*    Estimated Creatinine Clearance: 61.4 mL/min (by C-G formula based on SCr of 0.82 mg/dL).   Medical History: Past Medical History:  Diagnosis Date  . Anxiety   . Stress incontinence   . Tobacco use     e  Assessment: 62 y.o. female with chest pain on heparin. She is now s/p cath with Takotsubo (EF in cath 25%). Pharmacy to restart heparin 4 hours post sheath removal (done ~ 9:30am; radial approach). Per cath note heparin continued for 24 to 48 hours  -last heparin rate was 850 units/hr and heparin level was 0.45  Goal of Therapy:  Heparin level 0.3-0.7 units/ml Monitor platelets by anticoagulation protocol: Yes   Plan:  -restart heparin at 1:30pm at 850 units/hr  -Will follow for TR band removal -Heparin level in 6 hours and daily wth CBC daily  Hildred Laser, PharmD Clinical Pharmacist Please check Amion for pharmacy contact number

## 2017-10-30 NOTE — Progress Notes (Signed)
Progress Note  Patient Name: Kathryn [name] Date of Encounter: 10/30/2017  Primary Cardiologist: No primary care provider on file.  Subjective   Still with 5/10 chest pain this morning. Attempted nitro paste but developed severe headache.   Inpatient Medications    Scheduled Meds: . [START ON 10/31/2017] aspirin EC  81 mg Oral Daily  . atorvastatin  80 mg Oral q1800  . morphine  15 mg Oral Q12H   Continuous Infusions: . heparin 850 Units/hr (10/30/17 0207)   PRN Meds: acetaminophen, ALPRAZolam, nitroGLYCERIN, ondansetron (ZOFRAN) IV, oxyCODONE   Vital Signs    Vitals:   10/29/17 2309 10/30/17 0536  BP: (!) 137/101 125/88  Pulse: 86 89  Resp: 18 16  Temp: 98.2 F (36.8 C) 98.4 F (36.9 C)  TempSrc: Oral Oral  SpO2:  98%  Weight: 57.4 kg   Height: 5\' 4"  (1.626 m)     Intake/Output Summary (Last 24 hours) at 10/30/2017 0748 Last data filed at 10/30/2017 0457 Gross per 24 hour  Intake 23.96 ml  Output 700 ml  Net -676.04 ml   Filed Weights   10/29/17 2309  Weight: 57.4 kg    Telemetry    ST - Personally Reviewed  ECG    SR with  - Personally Reviewed  Physical Exam   General: Well developed, well nourished, older female appearing in no acute distress. Head: Normocephalic, atraumatic.  Neck: Supple without bruits, JVD. Lungs:  Resp regular and unlabored, CTA. Heart: RRR, S1, S2, no murmur; no rub. Abdomen: Soft, non-tender, non-distended with normoactive bowel sounds. No hepatomegaly. No rebound/guarding. No obvious abdominal masses. Extremities: No clubbing, cyanosis, edema. Distal pedal pulses are 2+ bilaterally. Neuro: Alert and oriented X 3. Moves all extremities spontaneously. Psych: Normal affect.  Labs    Chemistry Recent Labs  Lab 10/30/17 0227  NA 141  K 4.1  CL 105  CO2 25  GLUCOSE 93  BUN 9  CREATININE 0.82  CALCIUM 9.8  PROT 7.3  ALBUMIN 4.4  AST 37  ALT 21  ALKPHOS 42  BILITOT 1.3*  GFRNONAA >60  GFRAA >60    ANIONGAP 11     Hematology Recent Labs  Lab 10/30/17 0227  WBC 15.0*  RBC 4.34  HGB 14.8  HCT 44.1  MCV 101.6*  MCH 34.1*  MCHC 33.6  RDW 13.0  PLT 368    Cardiac Enzymes Recent Labs  Lab 10/30/17 0227  TROPONINI 2.29*   No results for input(s): TROPIPOC in the last 168 hours.   BNP Recent Labs  Lab 10/30/17 0227  BNP 300.7*    DDimer No results for input(s): DDIMER in the last 168 hours.    Radiology    No results found.  Cardiac Studies   N/a   Patient Profile     62 y.o. female with PMH of SCC of the nose, anxiety, smoking, and chronic pain who presented with chest pain to [facility] and found to have an elevated troponin. Transferred to [facility] for further work up.   Assessment & Plan    1. NSTEMI: 2.29 this morning. Attempted nitro paste but developed severe headache. Still with 5/10 chest pain. EKG without acute change. Remains on IV heparin. Given ongoing pain will plan for next case in the cath lab. Reports having a cath in 2016 at [facility] that was normal.  -- The patient understands that risks included but are not limited to stroke (1 in 1000), death (1 in 1000), kidney failure [usually temporary] (1  in 500), bleeding (1 in 200), allergic reaction [possibly serious] (1 in 200).  -- on ASA, statin. Add low dose metoprolol   2. Tobacco Use: discussed the need for cessation.   3. Chronic pain: on morphine  4. SCC of the nose   Signed, Reino Bellis, NP  10/30/2017, 7:48 AM  Pager # [phone]   For questions or updates, please contact [place] Please consult [email or web address] for contact info under Cardiology/STEMI.

## 2017-10-30 NOTE — Progress Notes (Signed)
Pt. Rcd HR 102 b/p 134/97 received , RH TR band remains stable. Pt. Requested lights off and door "1/2 way closed"- request honored. Family member at bedside. Awaiting bed assignment.

## 2017-10-30 NOTE — Progress Notes (Signed)
Pt arrived to the floor via Care link. Pt alert and oriented, on heparin gtt at 8.56ml/h, pt walked to bed, steady gate. CHG bath done, Heart monitor applied CCMD notified. MD on called informed. Pt has her medication with her. Medication counted and send to pharmacy. We'll continue to monitor.

## 2017-10-30 NOTE — Progress Notes (Signed)
[place] for Heparin Indication: chest pain/ACS  Allergies  Allergen Reactions  . Cephalosporins Rash    Patient Measurements: Height: 5' 4.5" (163.8 cm) Weight: 127 lb 13.9 oz (58 kg) IBW/kg (Calculated) : 55.85  Vital Signs: Temp: 98.6 F (37 C) (08/19 2010) Temp Source: Oral (08/19 2010) BP: 103/82 (08/19 2010) Pulse Rate: 94 (08/19 2010)  Labs (at [facility]): WBC  9.4 Hgb  14.1 Hct  41.4 Plt  348  SCr  0.7  Recent Labs    10/30/17 0227 10/30/17 1521 10/30/17 2251  HGB 14.8  --   --   HCT 44.1  --   --   PLT 368  --   --   LABPROT 13.7  --   --   INR 1.06  --   --   HEPARINUNFRC 0.45  --  0.12*  CREATININE 0.82  --   --   TROPONINI 2.29* 0.96*  --     Estimated Creatinine Clearance: 62.8 mL/min (by C-G formula based on SCr of 0.82 mg/dL).   Medical History: Past Medical History:  Diagnosis Date  . Anxiety   . Stress incontinence   . Tobacco use    Assessment: 62 y.o. female with chest pain on heparin. She is now s/p cath with Takotsubo (EF in cath 25%). Pharmacy to restart heparin 4 hours post sheath removal (done ~ 9:30am; radial approach). Per cath note heparin continued for 24 to 48 hours  -last heparin rate was 850 units/hr and heparin level was 0.45  8/19 PM update: sub-therapeutic heparin level after re-start s/p cath, no issues per RN.   Goal of Therapy:  Heparin level 0.3-0.7 units/ml Monitor platelets by anticoagulation protocol: Yes   Plan:  -Inc heparin to 950 units/hr -8 hour heparin level  Narda Bonds, PharmD, BCPS Clinical Pharmacist Phone: [phone]

## 2017-10-30 NOTE — Interval H&P Note (Signed)
Cath Lab Visit (complete for each Cath Lab visit)  Clinical Evaluation Leading to the Procedure:   ACS: Yes.    Non-ACS:    Anginal Classification: CCS III  Anti-ischemic medical therapy: No Therapy  Non-Invasive Test Results: No non-invasive testing performed  Prior CABG: No previous CABG      History and Physical Interval Note:  10/30/2017 8:52 AM  Kathryn [name]  has presented today for surgery, with the diagnosis of cp  The various methods of treatment have been discussed with the patient and family. After consideration of risks, benefits and other options for treatment, the patient has consented to  Procedure(s): LEFT HEART CATH AND CORONARY ANGIOGRAPHY (N/A) as a surgical intervention .  The patient's history has been reviewed, patient examined, no change in status, stable for surgery.  I have reviewed the patient's chart and labs.  Questions were answered to the patient's satisfaction.     Quay Burow

## 2017-10-31 ENCOUNTER — Encounter (HOSPITAL_COMMUNITY): Payer: Self-pay

## 2017-10-31 ENCOUNTER — Inpatient Hospital Stay (HOSPITAL_COMMUNITY): Payer: 59

## 2017-10-31 DIAGNOSIS — Z79899 Other long term (current) drug therapy: Secondary | ICD-10-CM | POA: Diagnosis not present

## 2017-10-31 DIAGNOSIS — I4581 Long QT syndrome: Secondary | ICD-10-CM | POA: Diagnosis present

## 2017-10-31 DIAGNOSIS — I21A1 Myocardial infarction type 2: Secondary | ICD-10-CM | POA: Diagnosis present

## 2017-10-31 DIAGNOSIS — I5181 Takotsubo syndrome: Secondary | ICD-10-CM | POA: Diagnosis present

## 2017-10-31 DIAGNOSIS — Z720 Tobacco use: Secondary | ICD-10-CM | POA: Diagnosis not present

## 2017-10-31 DIAGNOSIS — Z85828 Personal history of other malignant neoplasm of skin: Secondary | ICD-10-CM | POA: Diagnosis not present

## 2017-10-31 DIAGNOSIS — F17210 Nicotine dependence, cigarettes, uncomplicated: Secondary | ICD-10-CM | POA: Diagnosis present

## 2017-10-31 DIAGNOSIS — I248 Other forms of acute ischemic heart disease: Secondary | ICD-10-CM | POA: Diagnosis not present

## 2017-10-31 DIAGNOSIS — Z881 Allergy status to other antibiotic agents status: Secondary | ICD-10-CM | POA: Diagnosis not present

## 2017-10-31 DIAGNOSIS — G893 Neoplasm related pain (acute) (chronic): Secondary | ICD-10-CM | POA: Diagnosis present

## 2017-10-31 DIAGNOSIS — I200 Unstable angina: Secondary | ICD-10-CM | POA: Diagnosis present

## 2017-10-31 LAB — BASIC METABOLIC PANEL
ANION GAP: 7 (ref 5–15)
BUN: 8 mg/dL (ref 8–23)
CHLORIDE: 109 mmol/L (ref 98–111)
CO2: 23 mmol/L (ref 22–32)
Calcium: 8 mg/dL — ABNORMAL LOW (ref 8.9–10.3)
Creatinine, Ser: 0.77 mg/dL (ref 0.44–1.00)
GFR calc Af Amer: >60 mL/min (ref >60)
GLUCOSE: 121 mg/dL — AB (ref 70–99)
POTASSIUM: 3.8 mmol/L (ref 3.5–5.1)
Sodium: 139 mmol/L (ref 135–145)

## 2017-10-31 LAB — CBC
HCT: 41 % (ref 36.0–46.0)
HEMOGLOBIN: 13.5 g/dL (ref 12.0–15.0)
MCH: 33.8 pg (ref 26.0–34.0)
MCHC: 32.9 g/dL (ref 30.0–36.0)
MCV: 102.5 fL — AB (ref 78.0–100.0)
Platelets: 304 10*3/uL (ref 150–400)
RBC: 4 MIL/uL (ref 3.87–5.11)
RDW: 13.1 % (ref 11.5–15.5)
WBC: 11.6 10*3/uL — AB (ref 4.0–10.5)

## 2017-10-31 LAB — HEPARIN LEVEL (UNFRACTIONATED): Heparin Unfractionated: 0.27 IU/mL — ABNORMAL LOW (ref 0.30–0.70)

## 2017-10-31 LAB — TROPONIN I: Troponin I: 0.51 ng/mL (ref <0.03)

## 2017-10-31 MED ORDER — ENSURE ENLIVE PO LIQD
237.0000 mL | Freq: Two times a day (BID) | ORAL | Status: DC
  Administered 2017-11-01 – 2017-11-02 (×4): 237 mL via ORAL

## 2017-10-31 MED ORDER — ENOXAPARIN SODIUM 40 MG/0.4ML ~~LOC~~ SOLN
40.0000 mg | SUBCUTANEOUS | Status: DC
  Administered 2017-10-31 – 2017-11-01 (×2): 40 mg via SUBCUTANEOUS
  Filled 2017-10-31 (×2): qty 0.4

## 2017-10-31 MED ORDER — SODIUM CHLORIDE 0.9 % IV SOLN
INTRAVENOUS | Status: DC
  Administered 2017-10-31: 02:00:00 via INTRAVENOUS

## 2017-10-31 MED FILL — Morphine Sulfate Inj 4 MG/ML: INTRAMUSCULAR | Qty: 0.5 | Status: AC

## 2017-10-31 MED FILL — Nitroglycerin IV Soln 100 MCG/ML in D5W: INTRA_ARTERIAL | Qty: 10 | Status: AC

## 2017-10-31 NOTE — Progress Notes (Signed)
Initial Nutrition Assessment  DOCUMENTATION CODES:   Not applicable  INTERVENTION:   -Ensure Enlive po BID, each supplement provides 350 kcal and 20 grams of protein -MVI with minerals daily  NUTRITION DIAGNOSIS:   Increased nutrient needs related to acute illness(NSTEMI) as evidenced by estimated needs.  GOAL:   Patient will meet greater than or equal to 90% of their needs  MONITOR:   PO intake, Supplement acceptance, Labs, Weight trends, Skin, I & O's  REASON FOR ASSESSMENT:   Malnutrition Screening Tool    ASSESSMENT:   62 y.o. female with PMH of SCC of the nose, anxiety, smoking, and chronic pain who presented with chest pain to [facility] and found to have an elevated troponin. Transferred to [facility] for further work up.  8/19- lt heart cath and coronary angiography  Attempted to see pt, however, ambulating with cardiac rehab at time of visit. Unable to perform nutrition focused physical exam or obtain further nutrition hx at this time.   Reviewed wt hx; pt with hx of distant wt loss, however, no recent wt to assess acute weight changes.   Meal completion 0-80%; RD will order nutritional supplements to assist pt with meeting nutritional needs.   Labs reviewed.   Diet Order:   Diet Order            Diet regular Room service appropriate? Yes; Fluid consistency: Thin  Diet effective now              EDUCATION NEEDS:   Not appropriate for education at this time  Skin:  Skin Assessment: Reviewed RN Assessment  Last BM:  10/27/17  Height:   Ht Readings from Last 1 Encounters:  10/30/17 5' 4.5" (1.638 m)    Weight:   Wt Readings from Last 1 Encounters:  10/30/17 58 kg    Ideal Body Weight:  55.7 kg  BMI:  Body mass index is 21.61 kg/m.  Estimated Nutritional Needs:   Kcal:  1550-1750  Protein:  75-90 grams  Fluid:  1.5-1.7 L    Margit Batte A. Jimmye Norman, RD, LDN, CDE Pager: [phone] After hours Pager: [phone]

## 2017-10-31 NOTE — Progress Notes (Signed)
CARDIAC REHAB PHASE I   PRE:  Rate/Rhythm: 27 SR PACs  BP:  Supine:   Sitting: 113/82  Standing:    SaO2: 98%RA  MODE:  Ambulation: 340 ft   POST:  Rate/Rhythm: 97 SR PACs  Increased with activity  BP:  Supine:   Sitting: 125/80  Standing:    SaO2: 97%RA 1450-1512 Pt walked 340 ft on RA with little assistance. Gait steady. No CP. Gave pt an article on takotsubo and discussed what it means. Left low sodium diets and MI booklet. Will follow up tomorrow.   Graylon Good, RN BSN  10/31/2017 3:08 PM

## 2017-10-31 NOTE — Progress Notes (Signed)
Progress Note  Patient Name: SATCHA STORLIE Date of Encounter: 10/31/2017  Primary Cardiologist: No primary care provider on file.   Subjective   Feels better this am. HA and chest pain resolved. Still has back pain which is chronic. Does note dyspnea when BP drops. Overall appears much more comfortable.   Inpatient Medications    Scheduled Meds: . aspirin  81 mg Oral Daily  . atorvastatin  80 mg Oral q1800  . carvedilol  3.125 mg Oral BID WC  . enoxaparin  40 mg Subcutaneous Q24H  . morphine  15 mg Oral Q12H  . sodium chloride flush  3 mL Intravenous Q12H   Continuous Infusions: . sodium chloride     PRN Meds: sodium chloride, acetaminophen, ALPRAZolam, HYDROmorphone, nitroGLYCERIN, ondansetron (ZOFRAN) IV, oxyCODONE, sodium chloride flush   Vital Signs    Vitals:   10/31/17 0300 10/31/17 0400 10/31/17 0754 10/31/17 0800  BP: 93/64 102/76 (!) 85/63 (!) 89/59  Pulse: 73 76  78  Resp: 17 20  (!) 21  Temp:  98.4 F (36.9 C)  98 F (36.7 C)  TempSrc:    Oral  SpO2: 99% 99%    Weight:      Height:        Intake/Output Summary (Last 24 hours) at 10/31/2017 0840 Last data filed at 10/31/2017 0700 Gross per 24 hour  Intake 940.44 ml  Output -  Net 940.44 ml   Filed Weights   10/29/17 2309 10/30/17 1454  Weight: 57.4 kg 58 kg    Telemetry    NSR with PACs- Personally Reviewed  ECG    NSR with deep ST-T wave inversion in the inferolateral leads.  - Personally Reviewed  Physical Exam   GEN: No acute distress.   Neck: No JVD Cardiac: RRR, no murmurs, rubs, or gallops. Right wrist without hematoma. Respiratory: Clear to auscultation bilaterally. GI: Soft, nontender, non-distended  MS: No edema; No deformity. Neuro:  Nonfocal  Psych: Normal affect   Labs    Chemistry Recent Labs  Lab 10/30/17 0227 10/31/17 0539  NA 141 139  K 4.1 3.8  CL 105 109  CO2 25 23  GLUCOSE 93 121*  BUN 9 8  CREATININE 0.82 0.77  CALCIUM 9.8 8.0*  PROT 7.3  --     ALBUMIN 4.4  --   AST 37  --   ALT 21  --   ALKPHOS 42  --   BILITOT 1.3*  --   GFRNONAA >60 >60  GFRAA >60 >60  ANIONGAP 11 7     Hematology Recent Labs  Lab 10/30/17 0227 10/31/17 0539  WBC 15.0* 11.6*  RBC 4.34 4.00  HGB 14.8 13.5  HCT 44.1 41.0  MCV 101.6* 102.5*  MCH 34.1* 33.8  MCHC 33.6 32.9  RDW 13.0 13.1  PLT 368 304    Cardiac Enzymes Recent Labs  Lab 10/30/17 0227 10/30/17 1521 10/30/17 2255 10/31/17 0539  TROPONINI 2.29* 0.96* 0.81* 0.51*   No results for input(s): TROPIPOC in the last 168 hours.   BNP Recent Labs  Lab 10/30/17 0227  BNP 300.7*     DDimer No results for input(s): DDIMER in the last 168 hours.   Radiology    No results found.  Cardiac Studies   Echo: 10/30/17: Study Conclusions  - Left ventricle: The cavity size was normal. Systolic function was   severely reduced. The estimated ejection fraction was in the   range of 25% to 30%. There is akinesis of  the mid-apical   anterior, lateral, inferolateral, inferior, mid inferoseptal and   apical septal and apical myocardium. Findings suggestive of   stress induced cardiomyopathy (takotsubo) Features are consistent   with a pseudonormal left ventricular filling pattern, with   concomitant abnormal relaxation and increased filling pressure   (grade 2 diastolic dysfunction). Doppler parameters are   consistent with high ventricular filling pressure.  Procedures   LEFT HEART CATH AND CORONARY ANGIOGRAPHY  Conclusion     Ost LAD to Prox LAD lesion is 50% stenosed.  Prox LAD lesion is 50% stenosed.  Prox Cx to Mid Cx lesion is 50% stenosed.  There is severe left ventricular systolic dysfunction.  LV end diastolic pressure is moderately elevated.  The left ventricular ejection fraction is less than 25% by visual estimate.       Patient Profile     62 y.o. female with PMH of SCC of the nose, anxiety, smoking, and chronic pain who presented with chest pain to  [facility] and found to have an elevated troponin. Transferred to [facility] for further work up.  Assessment & Plan    1. NSTEMI secondary to demand ischemia in setting of stress induced cardiomyopathy (Takotsubo syndrome). Pain is much better. Peak troponin 2.29. EF 25-30% by Echo. Medical therapy limited by low BP. Clinically she is feeling better. Will DC IV heparin. Lovenox for DVT prophylaxis. Check CXR today. Advance activity as tolerated. Phase 1 Cardiac Rehab.  2. Tobacco use. Counseled on smoking cessation 3. Chronic pain. On prior pain regimen with prn dilaudid for breakthrough. 4. SCC of nose.   For questions or updates, please contact [place] Please consult [email or web address] for contact info under Cardiology/STEMI.      Signed, Dalores Weger [place], MD  10/31/2017, 8:40 AM

## 2017-11-01 MED ORDER — LISINOPRIL 2.5 MG PO TABS
1.2500 mg | ORAL_TABLET | Freq: Every day | ORAL | Status: DC
  Administered 2017-11-01: 1.25 mg via ORAL
  Filled 2017-11-01: qty 1

## 2017-11-01 MED ORDER — EST ESTROGENS-METHYLTEST 1.25-2.5 MG PO TABS
1.0000 | ORAL_TABLET | Freq: Every day | ORAL | Status: DC
  Administered 2017-11-01 – 2017-11-02 (×2): 1 via ORAL

## 2017-11-01 NOTE — Progress Notes (Signed)
Progress Note  Patient Name: Kathryn [name] Date of Encounter: 11/01/2017  Primary Cardiologist: No primary care provider on file.   Subjective   Feels better this am. Slept in recliner since bed uncomfortable. Noted some intermittent chest discomfort- mild lasting a few minutes. Did not receive Coreg yesterday due to low BP. No dyspnea.  Inpatient Medications    Scheduled Meds: . aspirin  81 mg Oral Daily  . atorvastatin  80 mg Oral q1800  . enoxaparin  40 mg Subcutaneous Q24H  . feeding supplement (ENSURE ENLIVE)  237 mL Oral BID BM  . lisinopril  1.25 mg Oral Daily  . morphine  15 mg Oral Q12H  . sodium chloride flush  3 mL Intravenous Q12H   Continuous Infusions: . sodium chloride     PRN Meds: sodium chloride, acetaminophen, ALPRAZolam, HYDROmorphone, nitroGLYCERIN, ondansetron (ZOFRAN) IV, oxyCODONE, sodium chloride flush   Vital Signs    Vitals:   10/31/17 1806 10/31/17 2010 11/01/17 0017 11/01/17 0425  BP: 99/75 103/68 120/86 (!) 88/60  Pulse:  73 79 70  Resp: 12 (!) 23 14 17   Temp:  98.2 F (36.8 C) 98.3 F (36.8 C) 97.6 F (36.4 C)  TempSrc:  Oral Oral Oral  SpO2:      Weight:      Height:        Intake/Output Summary (Last 24 hours) at 11/01/2017 0731 Last data filed at 10/31/2017 2200 Gross per 24 hour  Intake 723 ml  Output -  Net 723 ml   Filed Weights   10/29/17 2309 10/30/17 1454  Weight: 57.4 kg 58 kg    Telemetry    NSR with sinus brady. One transient episode of AV block- Personally Reviewed  ECG    NSR with deep ST-T wave inversion in the inferolateral leads.  - Personally Reviewed  Physical Exam   GEN: No acute distress.   Neck: No JVD Cardiac: RRR, no murmurs, rubs, or gallops. Right wrist without hematoma. Respiratory: Clear to auscultation bilaterally. GI: Soft, nontender, non-distended  MS: No edema; No deformity. Neuro:  Nonfocal  Psych: Normal affect   Labs    Chemistry Recent Labs  Lab 10/30/17 0227  10/31/17 0539  NA 141 139  K 4.1 3.8  CL 105 109  CO2 25 23  GLUCOSE 93 121*  BUN 9 8  CREATININE 0.82 0.77  CALCIUM 9.8 8.0*  PROT 7.3  --   ALBUMIN 4.4  --   AST 37  --   ALT 21  --   ALKPHOS 42  --   BILITOT 1.3*  --   GFRNONAA >60 >60  GFRAA >60 >60  ANIONGAP 11 7     Hematology Recent Labs  Lab 10/30/17 0227 10/31/17 0539  WBC 15.0* 11.6*  RBC 4.34 4.00  HGB 14.8 13.5  HCT 44.1 41.0  MCV 101.6* 102.5*  MCH 34.1* 33.8  MCHC 33.6 32.9  RDW 13.0 13.1  PLT 368 304    Cardiac Enzymes Recent Labs  Lab 10/30/17 0227 10/30/17 1521 10/30/17 2255 10/31/17 0539  TROPONINI 2.29* 0.96* 0.81* 0.51*   No results for input(s): TROPIPOC in the last 168 hours.   BNP Recent Labs  Lab 10/30/17 0227  BNP 300.7*     DDimer No results for input(s): DDIMER in the last 168 hours.   Radiology    Dg Chest 2 View  Result Date: 10/31/2017 CLINICAL DATA:  Shortness of breath. EXAM: CHEST - 2 VIEW COMPARISON:  10/29/2017.  09/19/2016.  10/05/2009. FINDINGS: Mediastinum and hilar structures stable. Stable right hilar prominence consistent prominent pulmonary vascularity. Similar findings noted on prior exams. Heart size stable. No focal alveolar infiltrate. No pleural effusion or pneumothorax. IMPRESSION: No acute cardiopulmonary disease. Electronically Signed   By: Marcello Moores  Register   On: 10/31/2017 12:36    Cardiac Studies   Echo: 10/30/17: Study Conclusions  - Left ventricle: The cavity size was normal. Systolic function was   severely reduced. The estimated ejection fraction was in the   range of 25% to 30%. There is akinesis of the mid-apical   anterior, lateral, inferolateral, inferior, mid inferoseptal and   apical septal and apical myocardium. Findings suggestive of   stress induced cardiomyopathy (takotsubo) Features are consistent   with a pseudonormal left ventricular filling pattern, with   concomitant abnormal relaxation and increased filling pressure    (grade 2 diastolic dysfunction). Doppler parameters are   consistent with high ventricular filling pressure.  Procedures   LEFT HEART CATH AND CORONARY ANGIOGRAPHY  Conclusion     Ost LAD to Prox LAD lesion is 50% stenosed.  Prox LAD lesion is 50% stenosed.  Prox Cx to Mid Cx lesion is 50% stenosed.  There is severe left ventricular systolic dysfunction.  LV end diastolic pressure is moderately elevated.  The left ventricular ejection fraction is less than 25% by visual estimate.       Patient Profile     62 y.o. female with PMH of SCC of the nose, anxiety, smoking, and chronic pain who presented with chest pain to [facility] and found to have an elevated troponin. Transferred to [facility] for further work up.  Assessment & Plan    1. NSTEMI secondary to demand ischemia in setting of stress induced cardiomyopathy (Takotsubo syndrome). Pain is much better. Peak troponin 2.29. EF 25-30% by Echo. Medical therapy limited by low BP. Unable to tolerate Coreg. Will try very low dose ACEi with lisinopril 1.25 mg daily. CXR yesterday was normal. Will Advance activity as tolerated. Phase 1 Cardiac Rehab. Transfer to telemetry today. Home tomorrow if continues to improve. 2. Tobacco use. Counseled on smoking cessation 3. Chronic pain. On prior pain regimen  4. SCC of nose.   For questions or updates, please contact [place] Please consult [email or web address] for contact info under Cardiology/STEMI.      Signed, Peter Martinique, MD  11/01/2017, 7:31 AM

## 2017-11-01 NOTE — Progress Notes (Signed)
CARDIAC REHAB PHASE I   PRE:  Rate/Rhythm: 70 SR  BP:  Supine:   Sitting: 107/79  Standing:    SaO2: 98%RA  MODE:  Ambulation: 540 ft   POST:  Rate/Rhythm: 95 SR  BP:  Supine:   Sitting: 127/79  Standing:    SaO2: 98%RA 1412-1520 Pt walked 540 ft on RA with steady gait and no CP. Tolerated well. MI and CHF ed completed with pt who voiced understanding. Gave CHF booklet and discussed daily weights, 2000 mg sodium restriction and when to call MD with signs/symptoms. Reviewed ex ed, gave heart healthy and low sodium diets and discussed CRP 2. Referring to [employer]. Gave fake cigarette and discussed smoking cessation. Encouraged her to call 1800quit now.    Graylon Good, RN BSN  11/01/2017 3:17 PM

## 2017-11-02 DIAGNOSIS — I248 Other forms of acute ischemic heart disease: Secondary | ICD-10-CM

## 2017-11-02 DIAGNOSIS — G8929 Other chronic pain: Secondary | ICD-10-CM

## 2017-11-02 DIAGNOSIS — Z720 Tobacco use: Secondary | ICD-10-CM

## 2017-11-02 HISTORY — DX: Other chronic pain: G89.29

## 2017-11-02 MED ORDER — LISINOPRIL 2.5 MG PO TABS
2.5000 mg | ORAL_TABLET | Freq: Every day | ORAL | Status: DC
  Administered 2017-11-02: 2.5 mg via ORAL
  Filled 2017-11-02: qty 1

## 2017-11-02 MED ORDER — ATORVASTATIN CALCIUM 80 MG PO TABS: 80.0000 mg | ORAL_TABLET | Freq: Every day | ORAL | 1 refills | Status: DC

## 2017-11-02 MED ORDER — LISINOPRIL 2.5 MG PO TABS: 2.5000 mg | ORAL_TABLET | Freq: Every day | ORAL | 2 refills | Status: DC

## 2017-11-02 MED ORDER — ASPIRIN 81 MG PO CHEW: 81.0000 mg | CHEWABLE_TABLET | Freq: Every day | ORAL | Status: DC

## 2017-11-02 NOTE — Progress Notes (Signed)
Discharged home by wheelchair accompanied by spouse , discharge instructions given to pt. Belongings taken home.

## 2017-11-02 NOTE — Progress Notes (Signed)
Home meds stored in the pharmacy returned to pt.

## 2017-11-02 NOTE — Discharge Summary (Addendum)
Discharge Summary    Patient ID: Kathryn [name],  MRN: [id number], DOB/AGE: 1956-01-16 62 y.o.  Admit date: 10/29/2017 Discharge date: 11/02/2017  Primary Care Provider: Ernestene Kiel Primary Cardiologist: Dr. Lennox Pippins   Discharge Diagnoses    Active Problems:   Chest pain   Takotsubo cardiomyopathy   Chronic pain   Tobacco use   Allergies Allergies  Allergen Reactions  . Cephalosporins Rash   Diagnostic Studies/Procedures    Cath: 10/30/17   Ost LAD to Prox LAD lesion is 50% stenosed.  Prox LAD lesion is 50% stenosed.  Prox Cx to Mid Cx lesion is 50% stenosed.  There is severe left ventricular systolic dysfunction.  LV end diastolic pressure is moderately elevated.  The left ventricular ejection fraction is less than 25% by visual estimate.   Kathryn [name] is a 62 y.o. female  IMPRESSION: Kathryn [name] has noncritical CAD with severe LV dysfunction and wall motion consistent with Takotsubo syndrome with an EF of 15 to 20%.  Medical therapy for LV dysfunction will be initiated.  The sheath was removed and a TR band was placed on the right wrist to achieve patent hemostasis.  Patient left the lab in stable condition.  Heparin will be restarted in 4 hours and continued for 24 to 48 hours given her positive enzymes.  She will need optimal medical therapy including beta-blockade, ACE inhibitor/Entresto, aspirin and high-dose statin therapy.  Tobacco cessation will be reinforced.  Quay Burow. MD, [facility]  TTE: 10/30/17  Study Conclusions  - Left ventricle: The cavity size was normal. Systolic function was   severely reduced. The estimated ejection fraction was in the   range of 25% to 30%. There is akinesis of the mid-apical   anterior, lateral, inferolateral, inferior, mid inferoseptal and   apical septal and apical myocardium. Findings suggestive of   stress induced cardiomyopathy (takotsubo) Features are consistent   with a pseudonormal left  ventricular filling pattern, with   concomitant abnormal relaxation and increased filling pressure   (grade 2 diastolic dysfunction). Doppler parameters are   consistent with high ventricular filling pressure. _____________   History of Present Illness      62 y.o. female w/ history of SCC of the nose, anxiety, smoking, and chronic pain who presents with chest pain.   Patient describes central, non-radiating chest pain that has been waxing and waning for the past several days. She typically takes Tums for her pain, which give her some relief. This time however Tums did not help. Her pain worsened today thus she came to an OSH ED for evaluation. In the ED her vital signs were within normal limits. Her troponin was elevated initially and rising on subsequent draw. She was started on a heparin drip with concern for ACS and transferred to [facility] for further evaluation.   Hospital Course     1. NSTEMI: secondary to demand ischemia in setting of stress induced cardiomyopathy (Takotsubo syndrome). Pain improved over the days followin cath. Peak troponin 2.29. EF 25-30% by Echo. Medical therapy was limited by low BP. Did not tolerate Coreg, but did well with low dose ACEi. Liisinopril to 2.5 mg daily at the time of discharge. Planned for repeat Echo in a few weeks as an outpatient. Worked well with cardiac rehab.   2. Tobacco use: Counseled on smoking cessation  3. Chronic pain: On prior pain regimen   4. SCC of nose.   Kathryn [name] was seen by Dr. Martinique and determined stable  for discharge home. Follow up in the office has been arranged. Medications are listed below.   _____________  Discharge Vitals Blood pressure 120/66, pulse 62, temperature 98 F (36.7 C), temperature source Oral, resp. rate 16, height 5' 4.5" (1.638 m), weight 58 kg, SpO2 100 %.  Filed Weights   10/29/17 2309 10/30/17 1454  Weight: 57.4 kg 58 kg    Labs & Radiologic Studies    CBC Recent Labs    10/31/17 0539   WBC 11.6*  HGB 13.5  HCT 41.0  MCV 102.5*  PLT 474   Basic Metabolic Panel Recent Labs    10/31/17 0539  NA 139  K 3.8  CL 109  CO2 23  GLUCOSE 121*  BUN 8  CREATININE 0.77  CALCIUM 8.0*   Liver Function Tests No results for input(s): AST, ALT, ALKPHOS, BILITOT, PROT, ALBUMIN in the last 72 hours. No results for input(s): LIPASE, AMYLASE in the last 72 hours. Cardiac Enzymes Recent Labs    10/30/17 1521 10/30/17 2255 10/31/17 0539  TROPONINI 0.96* 0.81* 0.51*   BNP Invalid input(s): POCBNP D-Dimer No results for input(s): DDIMER in the last 72 hours. Hemoglobin A1C No results for input(s): HGBA1C in the last 72 hours. Fasting Lipid Panel No results for input(s): CHOL, HDL, LDLCALC, TRIG, CHOLHDL, LDLDIRECT in the last 72 hours. Thyroid Function Tests No results for input(s): TSH, T4TOTAL, T3FREE, THYROIDAB in the last 72 hours.  Invalid input(s): FREET3 _____________  Dg Chest 2 View  Result Date: 10/31/2017 CLINICAL DATA:  Shortness of breath. EXAM: CHEST - 2 VIEW COMPARISON:  10/29/2017.  09/19/2016.  10/05/2009. FINDINGS: Mediastinum and hilar structures stable. Stable right hilar prominence consistent prominent pulmonary vascularity. Similar findings noted on prior exams. Heart size stable. No focal alveolar infiltrate. No pleural effusion or pneumothorax. IMPRESSION: No acute cardiopulmonary disease. Electronically Signed   By: Marcello Moores  Register   On: 10/31/2017 12:36   Disposition   Pt is being discharged home today in good condition.  Follow-up Plans & Appointments    Follow-up Information    Revankar, Reita Cliche, MD Follow up on 11/27/2017.   Specialty:  Cardiology Why:  at 11am for your follow up appt.  Contact information: [address] [place] [address] [phone]          Discharge Instructions    Amb Referral to Cardiac Rehabilitation   Complete by:  As directed    Referring to [place] CRP 2   Diagnosis:  NSTEMI    Call MD for:  redness, tenderness, or signs of infection (pain, swelling, redness, odor or green/yellow discharge around incision site)   Complete by:  As directed    Diet - low sodium heart healthy   Complete by:  As directed    Discharge instructions   Complete by:  As directed    Radial Site Care Refer to this sheet in the next few weeks. These instructions provide you with information on caring for yourself after your procedure. Your caregiver may also give you more specific instructions. Your treatment has been planned according to current medical practices, but problems sometimes occur. Call your caregiver if you have any problems or questions after your procedure. HOME CARE INSTRUCTIONS You may shower the day after the procedure.Remove the bandage (dressing) and gently wash the site with plain soap and water.Gently pat the site dry.  Do not apply powder or lotion to the site.  Do not submerge the affected site in water for 3 to 5 days.  Inspect the site at least twice daily.  Do not flex or bend the affected arm for 24 hours.  No lifting over 5 pounds (2.3 kg) for 5 days after your procedure.  Do not drive home if you are discharged the same day of the procedure. Have someone else drive you.  You may drive 24 hours after the procedure unless otherwise instructed by your caregiver.  What to expect: Any bruising will usually fade within 1 to 2 weeks.  Blood that collects in the tissue (hematoma) may be painful to the touch. It should usually decrease in size and tenderness within 1 to 2 weeks.  SEEK IMMEDIATE MEDICAL CARE IF: You have unusual pain at the radial site.  You have redness, warmth, swelling, or pain at the radial site.  You have drainage (other than a small amount of blood on the dressing).  You have chills.  You have a fever or persistent symptoms for more than 72 hours.  You have a fever and your symptoms suddenly get worse.  Your arm becomes pale, cool, tingly, or  numb.  You have heavy bleeding from the site. Hold pressure on the site.   For patients with congestive heart failure, we give them these special instructions:  1. Follow a low-salt diet and watch your fluid intake. In general, you should not be taking in more than 2 liters of fluid per day (no more than 8 glasses per day). Some patients are restricted to less than 1.5 liters of fluid per day (no more than 6 glasses per day). This includes sources of water in foods like soup, coffee, tea, milk, etc. 2. Weigh yourself on the same scale at same time of day and keep a log. 3. Call your doctor: (Anytime you feel any of the following symptoms)  - 3-4 pound weight gain in 1-2 days or 2 pounds overnight  - Shortness of breath, with or without a dry hacking cough  - Swelling in the hands, feet or stomach  - If you have to sleep on extra pillows at night in order to breathe   IT IS IMPORTANT TO LET YOUR DOCTOR KNOW EARLY ON IF YOU ARE HAVING SYMPTOMS SO WE CAN HELP YOU!   Increase activity slowly   Complete by:  As directed       Discharge Medications     Medication List    TAKE these medications   ALPRAZolam 0.25 MG tablet Commonly known as:  XANAX Take 0.25 mg by mouth at bedtime as needed for anxiety.   aspirin 81 MG chewable tablet Chew 1 tablet (81 mg total) by mouth daily. Start taking on:  11/03/2017   atorvastatin 80 MG tablet Commonly known as:  LIPITOR Take 1 tablet (80 mg total) by mouth daily at 6 PM.   estrogens-methylTEST 1.25-2.5 MG Tabs tablet Take 1 tablet by mouth daily.   JUICE PLUS FIBRE PO Take 2 capsules by mouth daily.   lisinopril 2.5 MG tablet Commonly known as:  PRINIVIL,ZESTRIL Take 1 tablet (2.5 mg total) by mouth daily. Start taking on:  11/03/2017   morphine 15 MG 12 hr tablet Commonly known as:  MS CONTIN Take 15 mg by mouth every 12 (twelve) hours.   oxybutynin 5 MG tablet Commonly known as:  DITROPAN Take 5 mg by mouth 2 (two) times daily.     Oxycodone HCl 10 MG Tabs Take 10 mg by mouth 2 (two) times daily as needed for severe pain.   polyethylene glycol packet  Commonly known as:  MIRALAX / GLYCOLAX Take 8.6 g by mouth daily.   progesterone 200 MG capsule Commonly known as:  PROMETRIUM Take 200 mg by mouth daily.   traZODone 50 MG tablet Commonly known as:  DESYREL Take 100 mg by mouth at bedtime.   Wrist Splint/Cock-Up/Left M Misc 1 Device by Does not apply route daily.       Acute coronary syndrome (MI, NSTEMI, STEMI, etc) this admission?: No   Outstanding Labs/Studies   Follow up echo in 6 weeks. FLP/LFTs in 6 weeks if tolerating statin.   Duration of Discharge Encounter   Greater than 30 minutes including physician time.  Signed, Reino Bellis NP-C 11/02/2017, 11:24 AM    Patient seen and examined and history reviewed. Agree with above findings and plan. Please see my earlier rounding note. Peter Martinique, [place] 11/02/2017 3:26 PM

## 2017-11-02 NOTE — Progress Notes (Signed)
CARDIAC REHAB PHASE I   PRE:  Rate/Rhythm: 4 SR  BP:  Supine:   Sitting: 117/75  Standing:    SaO2: 99%RA  MODE:  Ambulation: 680 ft   POST:  Rate/Rhythm: 85 SR  BP:  Supine:   Sitting: 122/88  Standing:    SaO2: 100%RA 1050-1108 Pt walked 680 ft on RA with steady gait and no complaints. Tolerated well.   Graylon Good, RN BSN  11/02/2017 11:05 AM

## 2017-11-02 NOTE — Progress Notes (Signed)
Progress Note  Patient Name: Kathryn [name] Date of Encounter: 11/02/2017  Primary Cardiologist: No primary care provider on file.   Subjective   Doing well from a cardiac standpoint. One minor chest pain episode lasting 5 minutes. Mainly complains of back pain which is chronic. No dyspnea.    Inpatient Medications    Scheduled Meds: . aspirin  81 mg Oral Daily  . atorvastatin  80 mg Oral q1800  . enoxaparin  40 mg Subcutaneous Q24H  . estrogens-methylTEST  1 tablet Oral Daily  . feeding supplement (ENSURE ENLIVE)  237 mL Oral BID BM  . lisinopril  2.5 mg Oral Daily  . morphine  15 mg Oral Q12H  . sodium chloride flush  3 mL Intravenous Q12H   Continuous Infusions: . sodium chloride     PRN Meds: sodium chloride, acetaminophen, ALPRAZolam, HYDROmorphone, nitroGLYCERIN, ondansetron (ZOFRAN) IV, oxyCODONE, sodium chloride flush   Vital Signs    Vitals:   11/02/17 0341 11/02/17 0618 11/02/17 0730 11/02/17 0801  BP: (!) 93/48 98/63 104/72 120/66  Pulse: 62  68 62  Resp: 16   16  Temp: (!) 97.3 F (36.3 C)   98 F (36.7 C)  TempSrc: Oral   Oral  SpO2: 100%  100% 100%  Weight:      Height:       No intake or output data in the 24 hours ending 11/02/17 0833 Filed Weights   10/29/17 2309 10/30/17 1454  Weight: 57.4 kg 58 kg    Telemetry    NSR  Personally Reviewed  ECG    NSR with deep ST-T wave inversion in the inferolateral leads.  - Personally Reviewed  Physical Exam   GEN: No acute distress.   Neck: No JVD Cardiac: RRR, no murmurs, rubs, or gallops. Right wrist without hematoma. Respiratory: Clear to auscultation bilaterally. GI: Soft, nontender, non-distended  MS: No edema; No deformity. Neuro:  Nonfocal  Psych: Normal affect   Labs    Chemistry Recent Labs  Lab 10/30/17 0227 10/31/17 0539  NA 141 139  K 4.1 3.8  CL 105 109  CO2 25 23  GLUCOSE 93 121*  BUN 9 8  CREATININE 0.82 0.77  CALCIUM 9.8 8.0*  PROT 7.3  --   ALBUMIN 4.4  --    AST 37  --   ALT 21  --   ALKPHOS 42  --   BILITOT 1.3*  --   GFRNONAA >60 >60  GFRAA >60 >60  ANIONGAP 11 7     Hematology Recent Labs  Lab 10/30/17 0227 10/31/17 0539  WBC 15.0* 11.6*  RBC 4.34 4.00  HGB 14.8 13.5  HCT 44.1 41.0  MCV 101.6* 102.5*  MCH 34.1* 33.8  MCHC 33.6 32.9  RDW 13.0 13.1  PLT 368 304    Cardiac Enzymes Recent Labs  Lab 10/30/17 0227 10/30/17 1521 10/30/17 2255 10/31/17 0539  TROPONINI 2.29* 0.96* 0.81* 0.51*   No results for input(s): TROPIPOC in the last 168 hours.   BNP Recent Labs  Lab 10/30/17 0227  BNP 300.7*     DDimer No results for input(s): DDIMER in the last 168 hours.   Radiology    Dg Chest 2 View  Result Date: 10/31/2017 CLINICAL DATA:  Shortness of breath. EXAM: CHEST - 2 VIEW COMPARISON:  10/29/2017.  09/19/2016.  10/05/2009. FINDINGS: Mediastinum and hilar structures stable. Stable right hilar prominence consistent prominent pulmonary vascularity. Similar findings noted on prior exams. Heart size stable. No focal alveolar  infiltrate. No pleural effusion or pneumothorax. IMPRESSION: No acute cardiopulmonary disease. Electronically Signed   By: Marcello Moores  Register   On: 10/31/2017 12:36    Cardiac Studies   Echo: 10/30/17: Study Conclusions  - Left ventricle: The cavity size was normal. Systolic function was   severely reduced. The estimated ejection fraction was in the   range of 25% to 30%. There is akinesis of the mid-apical   anterior, lateral, inferolateral, inferior, mid inferoseptal and   apical septal and apical myocardium. Findings suggestive of   stress induced cardiomyopathy (takotsubo) Features are consistent   with a pseudonormal left ventricular filling pattern, with   concomitant abnormal relaxation and increased filling pressure   (grade 2 diastolic dysfunction). Doppler parameters are   consistent with high ventricular filling pressure.  Procedures   LEFT HEART CATH AND CORONARY ANGIOGRAPHY    Conclusion     Ost LAD to Prox LAD lesion is 50% stenosed.  Prox LAD lesion is 50% stenosed.  Prox Cx to Mid Cx lesion is 50% stenosed.  There is severe left ventricular systolic dysfunction.  LV end diastolic pressure is moderately elevated.  The left ventricular ejection fraction is less than 25% by visual estimate.       Patient Profile     62 y.o. female with PMH of SCC of the nose, anxiety, smoking, and chronic pain who presented with chest pain to [facility] and found to have an elevated troponin. Transferred to [facility] for further work up.  Assessment & Plan    1. NSTEMI secondary to demand ischemia in setting of stress induced cardiomyopathy (Takotsubo syndrome). Pain is much better. Peak troponin 2.29. EF 25-30% by Echo. Medical therapy limited by low BP. Did not tolerate Coreg. Did well with low dose ACEi. Will increase lisinopril to 2.5 mg daily.  Plan DC home today. Follow up with Dr. Geraldo Pitter with our [facility] group. Plan to repeat Echo in a few weeks  2. Tobacco use. Counseled on smoking cessation 3. Chronic pain. On prior pain regimen  4. SCC of nose.   For questions or updates, please contact [place] Please consult [email or web address] for contact info under Cardiology/STEMI.      Signed, Anjelika Ausburn Martinique, MD  11/02/2017, 8:33 AM

## 2017-11-27 ENCOUNTER — Ambulatory Visit: Payer: 59 | Admitting: Cardiology

## 2017-11-27 ENCOUNTER — Encounter: Payer: Self-pay | Admitting: Cardiology

## 2017-11-27 VITALS — BP 116/68 | HR 82 | Ht 64.5 in | Wt 131.0 lb

## 2017-11-27 DIAGNOSIS — F17210 Nicotine dependence, cigarettes, uncomplicated: Secondary | ICD-10-CM

## 2017-11-27 DIAGNOSIS — I5181 Takotsubo syndrome: Secondary | ICD-10-CM | POA: Diagnosis not present

## 2017-11-27 HISTORY — DX: Nicotine dependence, cigarettes, uncomplicated: F17.210

## 2017-11-27 MED ORDER — FUROSEMIDE 20 MG PO TABS: 20.0000 mg | ORAL_TABLET | Freq: Every day | ORAL | 2 refills | Status: DC | PRN

## 2017-11-27 NOTE — Progress Notes (Signed)
Cardiology Office Note:    Date:  11/27/2017   ID:  EDLA PARA, DOB Sep 27, 1955, MRN [id number]  PCP:  Ernestene Kiel, MD  Cardiologist:  Jenean Lindau, MD   Referring MD: Ernestene Kiel, MD    ASSESSMENT:    1. Takotsubo cardiomyopathy   2. Cigarette smoker    PLAN:    In order of problems listed above:  1. Secondary prevention stressed with the patient.  Importance of compliance with diet and medication stressed and she vocalized understanding. 2. I concur with her treatment at [facility].  Unfortunately I cannot increase the dosage of her ACE inhibitor because of very borderline blood pressure.  We will have a Chem-7 today.  I did agree to give her diuretic furosemide 20 mg to be used on a as needed basis. 3. She was told that she will have an echocardiogram in the next few days and we will schedule one for her. 4. Patient will be seen in follow-up appointment in 2 months or earlier if the patient has any concerns.  I counseled her against smoking and she is agrees to quit.    Medication Adjustments/Labs and Tests Ordered: Current medicines are reviewed at length with the patient today.  Concerns regarding medicines are outlined above.  No orders of the defined types were placed in this encounter.  No orders of the defined types were placed in this encounter.    No chief complaint on file.    History of Present Illness:    Kathryn [name] is a 62 y.o. female.  The patient was at [facility] and had a non-STEMI.  She was found to have Takotsubo cardiomyopathy.  She was treated and discharged.  Her blood pressure is borderline and she is taking a very low-dose of lisinopril at this time.  No chest pain orthopnea or PND.  She feels that she has some shortness of breath at times and requests a diuretic to be used as needed.  Past Medical History:  Diagnosis Date  . Anxiety   . Stress incontinence   . Tobacco use     Past Surgical  History:  Procedure Laterality Date  . ABDOMINAL HYSTERECTOMY     cervical cancer  . back skin flap    . BACK SURGERY     lumbar and cervical  . LEFT HEART CATH AND CORONARY ANGIOGRAPHY N/A 10/30/2017   Procedure: LEFT HEART CATH AND CORONARY ANGIOGRAPHY;  Surgeon: Lorretta Harp, MD;  Location: [facility];  Service: Cardiovascular;  Laterality: N/A;  . MANDIBLE SURGERY     lower jaw  . MELANOMA EXCISION     L arm and L leg, 2014  . SKIN CANCER EXCISION     squamous cell cancer , 2012  . WRIST SURGERY      Current Medications: Current Meds  Medication Sig  . ALPRAZolam (XANAX) 0.25 MG tablet Take 0.25 mg by mouth at bedtime as needed for anxiety.   Marland Kitchen aspirin 81 MG chewable tablet Chew 1 tablet (81 mg total) by mouth daily.  Marland Kitchen atorvastatin (LIPITOR) 80 MG tablet Take 1 tablet (80 mg total) by mouth daily at 6 PM.  . estrogens-methylTEST 1.25-2.5 MG TABS per tablet Take 1 tablet by mouth daily.  Marland Kitchen lisinopril (PRINIVIL,ZESTRIL) 2.5 MG tablet Take 1 tablet (2.5 mg total) by mouth daily.  Marland Kitchen morphine (MS CONTIN) 15 MG 12 hr tablet Take 15 mg by mouth every 12 (twelve) hours.  . Nutritional Supplements (  JUICE PLUS FIBRE PO) Take 2 capsules by mouth daily.  Marland Kitchen oxybutynin (DITROPAN) 5 MG tablet Take 15 mg by mouth 2 (two) times daily.   . Oxycodone HCl 10 MG TABS Take 10 mg by mouth 2 (two) times daily as needed for severe pain.   . polyethylene glycol (MIRALAX / GLYCOLAX) packet Take 8.6 g by mouth daily.  . progesterone (PROMETRIUM) 200 MG capsule Take 200 mg by mouth daily.  . traZODone (DESYREL) 50 MG tablet Take 100 mg by mouth at bedtime.      Allergies:   Cephalosporins   Social History   Socioeconomic History  . Marital status: Married    Spouse name: Not on file  . Number of children: Not on file  . Years of education: Not on file  . Highest education level: Not on file  Occupational History  . Not on file  Social Needs  . Financial resource strain: Not on  file  . Food insecurity:    Worry: Not on file    Inability: Not on file  . Transportation needs:    Medical: Not on file    Non-medical: Not on file  Tobacco Use  . Smoking status: Current Every Day Smoker    Packs/day: 0.25    Years: 40.00    Pack years: 10.00    Types: Cigarettes  . Smokeless tobacco: Current User  Substance and Sexual Activity  . Alcohol use: No  . Drug use: No  . Sexual activity: Not on file  Lifestyle  . Physical activity:    Days per week: Not on file    Minutes per session: Not on file  . Stress: Not on file  Relationships  . Social connections:    Talks on phone: Not on file    Gets together: Not on file    Attends religious service: Not on file    Active member of club or organization: Not on file    Attends meetings of clubs or organizations: Not on file    Relationship status: Not on file  Other Topics Concern  . Not on file  Social History Narrative  . Not on file     Family History: The patient's family history is not on file.  ROS:   Please see the history of present illness.    All other systems reviewed and are negative.  EKGs/Labs/Other Studies Reviewed:    The following studies were reviewed today: I reviewed hospital notes extensively and discussed with the patient.   Recent Labs: 10/30/2017: ALT 21; B Natriuretic Peptide 300.7; TSH 1.123 10/31/2017: BUN 8; Creatinine, Ser 0.77; Hemoglobin 13.5; Platelets 304; Potassium 3.8; Sodium 139  Recent Lipid Panel    Component Value Date/Time   CHOL 138 10/30/2017 0227   TRIG 25 10/30/2017 0227   HDL 69 10/30/2017 0227   CHOLHDL 2.0 10/30/2017 0227   VLDL 5 10/30/2017 0227   LDLCALC 64 10/30/2017 0227    Physical Exam:    VS:  BP 116/68 (BP Location: Right Arm, Patient Position: Sitting, Cuff Size: Normal)   Pulse 82   Ht 5' 4.5" (1.638 m)   Wt 131 lb (59.4 kg)   SpO2 97%   BMI 22.14 kg/m     Wt Readings from Last 3 Encounters:  11/27/17 131 lb (59.4 kg)  10/30/17  127 lb 13.9 oz (58 kg)  08/16/13 148 lb (67.1 kg)     GEN: Patient is in no acute distress HEENT: Normal NECK: No JVD; No  carotid bruits LYMPHATICS: No lymphadenopathy CARDIAC: Hear sounds regular, 2/6 systolic murmur at the apex. RESPIRATORY:  Clear to auscultation without rales, wheezing or rhonchi  ABDOMEN: Soft, non-tender, non-distended MUSCULOSKELETAL:  No edema; No deformity  SKIN: Warm and dry NEUROLOGIC:  Alert and oriented x 3 PSYCHIATRIC:  Normal affect   Signed, Jenean Lindau, MD  11/27/2017 11:53 AM    [place]

## 2017-11-27 NOTE — Patient Instructions (Addendum)
Medication Instructions:  Your physician has recommended you make the following change in your medication:  START lasix (furosemide) 20 mg daily as needed for swelling  Labwork: Your physician recommends that you have the following labs drawn: BMP  Testing/Procedures: None  Follow-Up: Your physician recommends that you schedule a follow-up appointment in: 2 months  Any Other Special Instructions Will Be Listed Below (If Applicable).     If you need a refill on your cardiac medications before your next appointment, please call your pharmacy.   [place], RN, BSN  Furosemide tablets What is this medicine? FUROSEMIDE (fyoor [place] se mide) is a diuretic. It helps you make more urine and to lose salt and excess water from your body. This medicine is used to treat high blood pressure, and edema or swelling from heart, kidney, or liver disease. This medicine may be used for other purposes; ask your health care provider or pharmacist if you have questions. COMMON BRAND NAME(S): Active-Medicated Specimen Kit, Delone, Diuscreen, Lasix, RX Specimen Collection Kit, Specimen Collection Kit, URINX Medicated Specimen Collection What should I tell my health care provider before I take this medicine? They need to know if you have any of these conditions: -abnormal blood electrolytes -diarrhea or vomiting -gout -heart disease -kidney disease, small amounts of urine, or difficulty passing urine -liver disease -thyroid disease -an unusual or allergic reaction to furosemide, sulfa drugs, other medicines, foods, dyes, or preservatives -pregnant or trying to get pregnant -breast-feeding How should I use this medicine? Take this medicine by mouth with a glass of water. Follow the directions on the prescription label. You may take this medicine with or without food. If it upsets your stomach, take it with food or milk. Do not take your medicine more often than directed. Remember that  you will need to pass more urine after taking this medicine. Do not take your medicine at a time of day that will cause you problems. Do not take at bedtime. Talk to your pediatrician regarding the use of this medicine in children. While this drug may be prescribed for selected conditions, precautions do apply. Overdosage: If you think you have taken too much of this medicine contact a poison control center or emergency room at once. NOTE: This medicine is only for you. Do not share this medicine with others. What if I miss a dose? If you miss a dose, take it as soon as you can. If it is almost time for your next dose, take only that dose. Do not take double or extra doses. What may interact with this medicine? -aspirin and aspirin-like medicines -certain antibiotics -chloral hydrate -cisplatin -cyclosporine -digoxin -diuretics -laxatives -lithium -medicines for blood pressure -medicines that relax muscles for surgery -methotrexate -NSAIDs, medicines for pain and inflammation like ibuprofen, naproxen, or indomethacin -phenytoin -steroid medicines like prednisone or cortisone -sucralfate -thyroid hormones This list may not describe all possible interactions. Give your health care provider a list of all the medicines, herbs, non-prescription drugs, or dietary supplements you use. Also tell them if you smoke, drink alcohol, or use illegal drugs. Some items may interact with your medicine. What should I watch for while using this medicine? Visit your doctor or health care professional for regular checks on your progress. Check your blood pressure regularly. Ask your doctor or health care professional what your blood pressure should be, and when you should contact him or her. If you are a diabetic, check your blood sugar as directed. You may need  to be on a special diet while taking this medicine. Check with your doctor. Also, ask how many glasses of fluid you need to drink a day. You must not  get dehydrated. You may get drowsy or dizzy. Do not drive, use machinery, or do anything that needs mental alertness until you know how this drug affects you. Do not stand or sit up quickly, especially if you are an older patient. This reduces the risk of dizzy or fainting spells. Alcohol can make you more drowsy and dizzy. Avoid alcoholic drinks. This medicine can make you more sensitive to the sun. Keep out of the sun. If you cannot avoid being in the sun, wear protective clothing and use sunscreen. Do not use sun lamps or tanning beds/booths. What side effects may I notice from receiving this medicine? Side effects that you should report to your doctor or health care professional as soon as possible: -blood in urine or stools -dry mouth -fever or chills -hearing loss or ringing in the ears -irregular heartbeat -muscle pain or weakness, cramps -skin rash -stomach upset, pain, or nausea -tingling or numbness in the hands or feet -unusually weak or tired -vomiting or diarrhea -yellowing of the eyes or skin Side effects that usually do not require medical attention (report to your doctor or health care professional if they continue or are bothersome): -headache -loss of appetite -unusual bleeding or bruising This list may not describe all possible side effects. Call your doctor for medical advice about side effects. You may report side effects to FDA at 1-800-FDA-1088. Where should I keep my medicine? Keep out of the reach of children. Store at room temperature between 15 and 30 degrees C (59 and 86 degrees F). Protect from light. Throw away any unused medicine after the expiration date. NOTE: This sheet is a summary. It may not cover all possible information. If you have questions about this medicine, talk to your doctor, pharmacist, or health care provider.  2018 Elsevier/Gold Standard (2014-05-21 13:49:50)

## 2017-11-28 LAB — BASIC METABOLIC PANEL
BUN/Creatinine Ratio: 10 — ABNORMAL LOW (ref 12–28)
BUN: 8 mg/dL (ref 8–27)
CALCIUM: 9.3 mg/dL (ref 8.7–10.3)
CO2: 25 mmol/L (ref 20–29)
Chloride: 102 mmol/L (ref 96–106)
Creatinine, Ser: 0.81 mg/dL (ref 0.57–1.00)
GFR calc Af Amer: 90 mL/min/{1.73_m2} (ref >59)
GFR calc non Af Amer: 78 mL/min/{1.73_m2} (ref >59)
Glucose: 88 mg/dL (ref 65–99)
Potassium: 4.7 mmol/L (ref 3.5–5.2)
Sodium: 141 mmol/L (ref 134–144)

## 2017-12-01 ENCOUNTER — Encounter: Payer: Self-pay | Admitting: Gastroenterology

## 2017-12-15 ENCOUNTER — Ambulatory Visit: Payer: 59 | Admitting: Gastroenterology

## 2017-12-15 ENCOUNTER — Encounter: Payer: Self-pay | Admitting: Gastroenterology

## 2017-12-15 VITALS — BP 112/76 | HR 83 | Ht 64.0 in | Wt 133.0 lb

## 2017-12-15 DIAGNOSIS — K219 Gastro-esophageal reflux disease without esophagitis: Secondary | ICD-10-CM | POA: Diagnosis not present

## 2017-12-15 DIAGNOSIS — R1310 Dysphagia, unspecified: Secondary | ICD-10-CM | POA: Diagnosis not present

## 2017-12-15 MED ORDER — OMEPRAZOLE 20 MG PO CPDR: 20.0000 mg | DELAYED_RELEASE_CAPSULE | Freq: Every day | ORAL | 6 refills | Status: DC

## 2017-12-15 NOTE — Progress Notes (Signed)
Chief Complaint: Dysphagia  Referring Provider:  Ernestene Kiel, MD      ASSESSMENT AND PLAN;   #1. GERD with esophageal dysphagia. Differential diagnoses includes esophageal stricture, Schatzki's ring, motility disorder, eosinophilic esophagitis, pill induced esophagitis, rule out esophageal carcinoma or extrinsic lesions.  #2. H/O NSTEMI 10/30/2017: d/t demand ischemia d/t stress induced cardiomyopathy (Takotsubo syndrome). EF 25-30% by Echo. Being followed by Dr Geraldo Pitter.  Plan: - Restart omeprazole 20 mg po qd. - Ba swallow with Ba tablet. - EGD with dilatation only if needed, after cardiology clearence at [facility]. I have discussed with the patient that normally we like to wait for at least 3 months for elective procedures after MI.  Unfortunately, patient's deductible starts again November 1.  She would like to get endoscopic procedure done before if she can. - I have instructed patient that she needs to chew foods especially meats and breads well and eat slowly. - Stop smoking. - FU in 6 months, earlier if problems. - Screening colonoscopy next year once better from cardiac standpoint.    HPI:    Kathryn [name] is a 62 y.o. female  S/p NSTEMI 10/2017 - noncritical CAD with severe LV dysfunction and wall motion consistent with Takotsubo syndrome with an EF of 15 to 20%. (Cath 10/30/2017). Being followed by Dr. Geraldo Pitter.  Doing very well.  No chest pains.  Continues to smoke but has cut down to 4 cigarettes/day.  Has been walking every day without getting short of breath.  Here for dysphagia, solids, mostly bread and steak, upper and mid chest, intermittent x 5 yrs, getting worse lately, assoc heartburn occasionally.  No self-induced vomiting or nausea.  No weight loss, odynophagia, melena or hematochezia. She has been scheduled previously for EGD several years ago but canceled.  Omeprazole stopped since she ran out.  She has history of chronic constipation for which she takes  MiraLAX 17 g p.o. once a day with good results.  Last colonoscopy by Dr. Melina Copa at the age of 85 (2008)-negative per patient, repeat in 10 years.  However, she has not done so.  Has family history of colonic polyps-mother at the age of 30. At the present time due to recent cardiac history, not a candidate for elective colonoscopy at the present time.   Past Medical History:  Diagnosis Date  . Anxiety   . Arthritis   . Heart attack ([place]) 10/2017  . Kidney stone   . Pneumonia   . Skin cancer (melanoma) ([place])   . Stress incontinence   . Tobacco use   . UTI (urinary tract infection)     Past Surgical History:  Procedure Laterality Date  . ABDOMINAL HYSTERECTOMY     cervical cancer  . back skin flap    . BACK SURGERY     lumbar and cervical  . hernia surgery     X2   . LEFT HEART CATH AND CORONARY ANGIOGRAPHY N/A 10/30/2017   Procedure: LEFT HEART CATH AND CORONARY ANGIOGRAPHY;  Surgeon: Lorretta Harp, MD;  Location: [facility];  Service: Cardiovascular;  Laterality: N/A;  . MANDIBLE SURGERY     lower jaw  . MELANOMA EXCISION     L arm and L leg, 2014  . muscle flap surgery    . NOSE SURGERY  2012   Lost her nose due to cancer  . SKIN CANCER EXCISION     squamous cell cancer , 2012  . WRIST SURGERY  Family History  Problem Relation Age of Onset  . Breast cancer Mother        said all her sisters too  . Colon polyps Mother   . Heart disease Father   . Diabetes Father   . Kidney disease Father        stage 3  . Colon cancer Neg Hx   . Esophageal cancer Neg Hx     Social History   Tobacco Use  . Smoking status: Current Every Day Smoker    Packs/day: 0.25    Years: 40.00    Pack years: 10.00    Types: Cigarettes  . Smokeless tobacco: Current User  Substance Use Topics  . Alcohol use: No  . Drug use: No    Current Outpatient Medications  Medication Sig Dispense Refill  . ALPRAZolam (XANAX) 0.25 MG tablet Take 0.25 mg by mouth at bedtime as  needed for anxiety.     Marland Kitchen aspirin 81 MG chewable tablet Chew 1 tablet (81 mg total) by mouth daily.    Marland Kitchen atorvastatin (LIPITOR) 80 MG tablet Take 1 tablet (80 mg total) by mouth daily at 6 PM. 30 tablet 1  . estrogens-methylTEST 1.25-2.5 MG TABS per tablet Take 1 tablet by mouth daily.    . furosemide (LASIX) 20 MG tablet Take 1 tablet (20 mg total) by mouth daily as needed. 90 tablet 2  . lisinopril (PRINIVIL,ZESTRIL) 2.5 MG tablet Take 1 tablet (2.5 mg total) by mouth daily. 30 tablet 2  . morphine (MS CONTIN) 15 MG 12 hr tablet Take 15 mg by mouth every 12 (twelve) hours.    . Nutritional Supplements (JUICE PLUS FIBRE PO) Take 2 capsules by mouth daily.    Marland Kitchen oxybutynin (DITROPAN) 5 MG tablet Take 15 mg by mouth 2 (two) times daily.     . Oxycodone HCl 10 MG TABS Take 10 mg by mouth 2 (two) times daily as needed for severe pain.     . polyethylene glycol (MIRALAX / GLYCOLAX) packet Take 8.6 g by mouth daily.    . progesterone (PROMETRIUM) 200 MG capsule Take 200 mg by mouth daily.    . traZODone (DESYREL) 50 MG tablet Take 100 mg by mouth at bedtime.      No current facility-administered medications for this visit.     Allergies  Allergen Reactions  . Cephalosporins Rash    Review of Systems:  Constitutional: Denies fever, chills, diaphoresis, appetite change and has some fatigue.  HEENT: Denies photophobia, eye pain, redness, hearing loss, ear pain, congestion, sore throat, rhinorrhea, sneezing, mouth sores, neck pain, neck stiffness and tinnitus.  Has sinusitis. Respiratory: has SOB, No DOE, cough, chest tightness,  and wheezing.   Cardiovascular: Denies chest pain, palpitations and leg swelling.  Genitourinary: Denies dysuria, urgency, frequency, hematuria, flank pain and difficulty urinating.  Musculoskeletal: Denies myalgias, back pain, joint swelling, arthralgias and gait problem.  Skin: No rash.  Neurological: Denies dizziness, seizures, syncope, weakness, light-headedness,  numbness and headaches.  Hematological: Denies adenopathy. Easy bruising, personal or family bleeding history  Psychiatric/Behavioral:  Has anxiety or depression     Physical Exam:    BP 112/76   Pulse 83   Ht 5\' 4"  (1.626 m)   Wt 133 lb (60.3 kg)   BMI 22.83 kg/m  Filed Weights   12/15/17 1018  Weight: 133 lb (60.3 kg)   Constitutional:  Well-developed, in no acute distress. Psychiatric: Normal mood and affect. Behavior is normal. HEENT: Pupils normal.  Conjunctivae are  normal. No scleral icterus. Neck supple.  Cardiovascular: Normal rate, regular rhythm. No edema Pulmonary/chest: Effort normal and breath sounds decreased bilaterally. No wheezing, rales or rhonchi. Abdominal: Soft, nondistended. Nontender. Bowel sounds active throughout. There are no masses palpable. No hepatomegaly. Rectal:  defered Neurological: Alert and oriented to person place and time. Skin: Skin is warm and dry. No rashes noted.  Data Reviewed: I have personally reviewed following labs and imaging studies  CBC: CBC Latest Ref Rng & Units 10/31/2017 10/30/2017  WBC 4.0 - 10.5 K/uL 11.6(H) 15.0(H)  Hemoglobin 12.0 - 15.0 g/dL 13.5 14.8  Hematocrit 36.0 - 46.0 % 41.0 44.1  Platelets 150 - 400 K/uL 304 368    CMP: CMP Latest Ref Rng & Units 11/27/2017 10/31/2017 10/30/2017  Glucose 65 - 99 mg/dL 88 121(H) 93  BUN 8 - 27 mg/dL 8 8 9   Creatinine 0.57 - 1.00 mg/dL 0.81 0.77 0.82  Sodium 134 - 144 mmol/L 141 139 141  Potassium 3.5 - 5.2 mmol/L 4.7 3.8 4.1  Chloride 96 - 106 mmol/L 102 109 105  CO2 20 - 29 mmol/L 25 23 25   Calcium 8.7 - 10.3 mg/dL 9.3 8.0(L) 9.8  Total Protein 6.5 - 8.1 g/dL - - 7.3  Total Bilirubin 0.3 - 1.2 mg/dL - - 1.3(H)  Alkaline Phos 38 - 126 U/L - - 42  AST 15 - 41 U/L - - 37  ALT 0 - 44 U/L - - 21  Seen in presence of Docia Chuck CMA. Extensive cardiology notes were reviewed.    Carmell Austria, MD 12/15/2017, 10:41 AM  Cc: Ernestene Kiel, MD

## 2017-12-15 NOTE — Patient Instructions (Signed)
If you are age 62 or older, your body mass index should be between 23-30. Your Body mass index is 22.83 kg/m. If this is out of the aforementioned range listed, please consider follow up with your Primary Care Provider.  If you are age 64 or younger, your body mass index should be between 19-25. Your Body mass index is 22.83 kg/m. If this is out of the aformentioned range listed, please consider follow up with your Primary Care Provider.   You have been scheduled for a Barium Esophogram at [facility] (1st floor of the hospital) on 01/03/18 at 9:30am. Please arrive 15 minutes prior to your appointment for registration. Make certain not to have anything to eat or drink 3 hours prior to your test. If you need to reschedule for any reason, please contact radiology at [phone] to do so. __________________________________________________________________ A barium swallow is an examination that concentrates on views of the esophagus. This tends to be a double contrast exam (barium and two liquids which, when combined, create a gas to distend the wall of the oesophagus) or single contrast (non-ionic iodine based). The study is usually tailored to your symptoms so a good history is essential. Attention is paid during the study to the form, structure and configuration of the esophagus, looking for functional disorders (such as aspiration, dysphagia, achalasia, motility and reflux) EXAMINATION You may be asked to change into a gown, depending on the type of swallow being performed. A radiologist and radiographer will perform the procedure. The radiologist will advise you of the type of contrast selected for your procedure and direct you during the exam. You will be asked to stand, sit or lie in several different positions and to hold a small amount of fluid in your mouth before being asked to swallow while the imaging is performed .In some instances you may be asked to swallow barium coated  marshmallows to assess the motility of a solid food bolus. The exam can be recorded as a digital or video fluoroscopy procedure. POST PROCEDURE It will take 1-2 days for the barium to pass through your system. To facilitate this, it is important, unless otherwise directed, to increase your fluids for the next 24-48hrs and to resume your normal diet.  This test typically takes about 30 minutes to perform. __________________________________________________________________________________  Please purchase the following medications over the counter and take as directed: Miralax 17 grams once daily.   Thank you,  Dr. Jackquline Denmark

## 2018-01-03 ENCOUNTER — Ambulatory Visit (HOSPITAL_COMMUNITY)
Admission: RE | Admit: 2018-01-03 | Discharge: 2018-01-03 | Disposition: A | Discharge status: Home / Self Care | Payer: 59 | Source: Ambulatory Visit | Attending: Gastroenterology | Admitting: Gastroenterology

## 2018-01-03 DIAGNOSIS — M2578 Osteophyte, vertebrae: Secondary | ICD-10-CM | POA: Insufficient documentation

## 2018-01-03 DIAGNOSIS — R1310 Dysphagia, unspecified: Secondary | ICD-10-CM

## 2018-01-03 DIAGNOSIS — K219 Gastro-esophageal reflux disease without esophagitis: Secondary | ICD-10-CM | POA: Insufficient documentation

## 2018-01-08 ENCOUNTER — Other Ambulatory Visit: Payer: Self-pay

## 2018-01-08 ENCOUNTER — Ambulatory Visit (INDEPENDENT_AMBULATORY_CARE_PROVIDER_SITE_OTHER): Payer: 59

## 2018-01-08 ENCOUNTER — Other Ambulatory Visit (HOSPITAL_COMMUNITY): Payer: Self-pay | Admitting: Specialist

## 2018-01-08 DIAGNOSIS — I5181 Takotsubo syndrome: Secondary | ICD-10-CM

## 2018-01-08 DIAGNOSIS — R1310 Dysphagia, unspecified: Secondary | ICD-10-CM

## 2018-01-08 DIAGNOSIS — K219 Gastro-esophageal reflux disease without esophagitis: Secondary | ICD-10-CM

## 2018-01-08 NOTE — Progress Notes (Signed)
Complete echocardiogram has been performed.  Jimmy Audia Amick RDCS, RVT 

## 2018-01-15 ENCOUNTER — Other Ambulatory Visit: Payer: Self-pay | Admitting: Cardiology

## 2018-01-16 ENCOUNTER — Ambulatory Visit (HOSPITAL_COMMUNITY)
Admission: RE | Admit: 2018-01-16 | Discharge: 2018-01-16 | Disposition: A | Discharge status: Home / Self Care | Payer: 59 | Source: Ambulatory Visit | Attending: Specialist | Admitting: Specialist

## 2018-01-16 DIAGNOSIS — R1310 Dysphagia, unspecified: Secondary | ICD-10-CM

## 2018-01-16 DIAGNOSIS — R1313 Dysphagia, pharyngeal phase: Secondary | ICD-10-CM | POA: Insufficient documentation

## 2018-01-16 DIAGNOSIS — K219 Gastro-esophageal reflux disease without esophagitis: Secondary | ICD-10-CM

## 2018-02-13 ENCOUNTER — Telehealth: Payer: Self-pay | Admitting: Cardiology

## 2018-02-13 NOTE — Telephone Encounter (Signed)
Has been having "mini attacks" lately-gets dizzy, has headaches, high heartrate, BP goes up

## 2018-02-14 ENCOUNTER — Ambulatory Visit (INDEPENDENT_AMBULATORY_CARE_PROVIDER_SITE_OTHER): Payer: 59

## 2018-02-14 ENCOUNTER — Encounter: Payer: Self-pay | Admitting: Cardiology

## 2018-02-14 ENCOUNTER — Ambulatory Visit: Payer: 59 | Admitting: Cardiology

## 2018-02-14 VITALS — BP 116/58 | HR 60 | Ht 64.0 in | Wt 137.0 lb

## 2018-02-14 DIAGNOSIS — F17210 Nicotine dependence, cigarettes, uncomplicated: Secondary | ICD-10-CM

## 2018-02-14 DIAGNOSIS — Z720 Tobacco use: Secondary | ICD-10-CM | POA: Diagnosis not present

## 2018-02-14 DIAGNOSIS — I5181 Takotsubo syndrome: Secondary | ICD-10-CM

## 2018-02-14 DIAGNOSIS — R002 Palpitations: Secondary | ICD-10-CM | POA: Diagnosis not present

## 2018-02-14 NOTE — Patient Instructions (Signed)
Medication Instructions:  Your physician has recommended you make the following change in your medication:   Please hold lisinopril until you call us Friday with your blood pressure log report.    If you need a refill on your cardiac medications before your next appointment, please call your pharmacy.   Lab work: Your physician recommends that you have the following labs drawn: BMP and TSH to be done today.  If you have labs (blood work) drawn today and your tests are completely normal, you will receive your results only by: Marland Kitchen MyChart Message (if you have MyChart) OR . A paper copy in the mail If you have any lab test that is abnormal or we need to change your treatment, we will call you to review the results.  Testing/Procedures: Your physician has recommended that you wear a holter monitor. Holter monitors are medical devices that record the heart's electrical activity. Doctors most often use these monitors to diagnose arrhythmias. Arrhythmias are problems with the speed or rhythm of the heartbeat. The monitor is a small, portable device. You can wear one while you do your normal daily activities. This is usually used to diagnose what is causing palpitations/syncope (passing out).  Follow-Up: At [facility], you and your health needs are our priority.  As part of our continuing mission to provide you with exceptional heart care, we have created designated Provider Care Teams.  These Care Teams include your primary Cardiologist (physician) and Advanced Practice Providers (APPs -  Physician Assistants and Nurse Practitioners) who all work together to provide you with the care you need, when you need it.  You will need a follow up appointment in 3 months.  Please call our office 2 months in advance to schedule this appointment.  You may see another member of our [employer] Provider Team in [place]: Jenne Campus, MD . Shirlee More, MD  Any Other Special Instructions Will Be Listed  Below (If Applicable).

## 2018-02-14 NOTE — Telephone Encounter (Signed)
Spoke with patient yesterday regarding her concerns, patient requested that she be seen in office. Appointment was set for today. Spoke with patient this am and she is feeling well but would like to keep her appointment for today as the spells are intermittent.

## 2018-02-14 NOTE — Progress Notes (Signed)
Cardiology Office Note:    Date:  02/14/2018   ID:  Kathryn [name], DOB 04-22-1955, MRN [id number]  PCP:  Ernestene Kiel, MD  Cardiologist:  Jenean Lindau, MD   Referring MD: Ernestene Kiel, MD    ASSESSMENT:    1. Takotsubo cardiomyopathy   2. Tobacco use   3. Cigarette smoker   4. Palpitations    PLAN:    In order of problems listed above:  1. I discussed my findings with the patient at extensive length.  I think her blood pressure is very borderline.  At times it is elevated but in general it is borderline.  I think she is not able to tolerate the increase in ACE inhibitor.  I have asked her to hold it for now and give [place] a call on Friday morning to see how she feels.  Hopefully this intervention should make her feel better.  She will have blood work today and we will forwarded to her primary care physician. 2. I spent 5 minutes with the patient discussing solely about smoking. Smoking cessation was counseled. I suggested to the patient also different medications and pharmacological interventions. Patient is keen to try stopping on its own at this time. He will get back to me if he needs any further assistance in this matter. 3. Patient will be seen in follow-up appointment in 6 months or earlier if the patient has any concerns    Medication Adjustments/Labs and Tests Ordered: Current medicines are reviewed at length with the patient today.  Concerns regarding medicines are outlined above.  Orders Placed This Encounter  Procedures  . Basic metabolic panel  . TSH  . HOLTER MONITOR - 48 HOUR  . EKG 12-Lead   No orders of the defined types were placed in this encounter.    No chief complaint on file.    History of Present Illness:    Kathryn [name] is a 62 y.o. female.  The patient has history of Takotsubo cardiomyopathy.  Subsequently she has [name] fine and her ejection fraction has normalized.  She denies any chest pain orthopnea or PND.  Occasionally she  will feel a skipped beat.  She is feeling tired and is concerned about it.  Her primary care physician doubled her lisinopril.  She is already taking a low-dose.  Ever since that has been changed she has been feeling tired and fatigued and at times lightheaded.  Most often she says when she changes posture or stoops down gets up she feels significantly lightheaded and this is been happening over the past couple of weeks.  She is concerned about it.  At the time of my evaluation, the patient is alert awake oriented and in no distress.  Past Medical History:  Diagnosis Date  . Anxiety   . Arthritis   . Heart attack ([place]) 10/2017  . Kidney stone   . Pneumonia   . Skin cancer (melanoma) ([place])   . Stress incontinence   . Tobacco use   . UTI (urinary tract infection)     Past Surgical History:  Procedure Laterality Date  . ABDOMINAL HYSTERECTOMY     cervical cancer  . back skin flap    . BACK SURGERY     lumbar and cervical  . hernia surgery     X2   . LEFT HEART CATH AND CORONARY ANGIOGRAPHY N/A 10/30/2017   Procedure: LEFT HEART CATH AND CORONARY ANGIOGRAPHY;  Surgeon: Lorretta Harp, MD;  Location: [facility];  Service: Cardiovascular;  Laterality: N/A;  . MANDIBLE SURGERY     lower jaw  . MELANOMA EXCISION     L arm and L leg, 2014  . muscle flap surgery    . NOSE SURGERY  2012   Lost her nose due to cancer  . SKIN CANCER EXCISION     squamous cell cancer , 2012  . WRIST SURGERY      Current Medications: Current Meds  Medication Sig  . ALPRAZolam (XANAX) 0.25 MG tablet Take 0.25 mg by mouth at bedtime as needed for anxiety.   Marland Kitchen aspirin EC 81 MG tablet Take 81 mg by mouth daily.  Marland Kitchen atorvastatin (LIPITOR) 80 MG tablet TAKE 1 TABLET BY MOUTH ONCE DAILY AT 6 PM.  . estrogens-methylTEST 1.25-2.5 MG TABS per tablet Take 1 tablet by mouth daily.  . furosemide (LASIX) 20 MG tablet Take 1 tablet (20 mg total) by mouth daily as needed.  Marland Kitchen lisinopril (PRINIVIL,ZESTRIL) 5 MG  tablet Take 5 mg by mouth daily.  Marland Kitchen morphine (MS CONTIN) 15 MG 12 hr tablet Take 15 mg by mouth every 12 (twelve) hours.  . Nutritional Supplements (JUICE PLUS FIBRE PO) Take 2 capsules by mouth daily.  Marland Kitchen omeprazole (PRILOSEC) 20 MG capsule Take 1 capsule (20 mg total) by mouth daily.  Marland Kitchen oxybutynin (DITROPAN) 5 MG tablet Take 15 mg by mouth 2 (two) times daily.   . Oxycodone HCl 10 MG TABS Take 10 mg by mouth 2 (two) times daily as needed for severe pain.   . polyethylene glycol (MIRALAX / GLYCOLAX) packet Take 8.6 g by mouth daily.  . progesterone (PROMETRIUM) 200 MG capsule Take 200 mg by mouth daily.  . traZODone (DESYREL) 50 MG tablet Take 100 mg by mouth at bedtime.      Allergies:   Cephalosporins   Social History   Socioeconomic History  . Marital status: Married    Spouse name: Not on file  . Number of children: 2  . Years of education: Not on file  . Highest education level: Not on file  Occupational History  . Not on file  Social Needs  . Financial resource strain: Not on file  . Food insecurity:    Worry: Not on file    Inability: Not on file  . Transportation needs:    Medical: Not on file    Non-medical: Not on file  Tobacco Use  . Smoking status: Current Every Day Smoker    Packs/day: 0.25    Years: 40.00    Pack years: 10.00    Types: Cigarettes  . Smokeless tobacco: Current User  Substance and Sexual Activity  . Alcohol use: No  . Drug use: No  . Sexual activity: Not on file  Lifestyle  . Physical activity:    Days per week: Not on file    Minutes per session: Not on file  . Stress: Not on file  Relationships  . Social connections:    Talks on phone: Not on file    Gets together: Not on file    Attends religious service: Not on file    Active member of club or organization: Not on file    Attends meetings of clubs or organizations: Not on file    Relationship status: Not on file  Other Topics Concern  . Not on file  Social History Narrative  .  Not on file     Family History: The patient's family history includes Breast cancer in  her mother; Colon polyps in her mother; Diabetes in her father; Heart disease in her father; Kidney disease in her father. There is no history of Colon cancer or Esophageal cancer.  ROS:   Please see the history of present illness.    All other systems reviewed and are negative.  EKGs/Labs/Other Studies Reviewed:    The following studies were reviewed today: I discussed my findings with the patient at extensive length.  EKG reveals sinus rhythm and nonspecific ST-T changes.   Recent Labs: 10/30/2017: ALT 21; B Natriuretic Peptide 300.7; TSH 1.123 10/31/2017: Hemoglobin 13.5; Platelets 304 11/27/2017: BUN 8; Creatinine, Ser 0.81; Potassium 4.7; Sodium 141  Recent Lipid Panel    Component Value Date/Time   CHOL 138 10/30/2017 0227   TRIG 25 10/30/2017 0227   HDL 69 10/30/2017 0227   CHOLHDL 2.0 10/30/2017 0227   VLDL 5 10/30/2017 0227   LDLCALC 64 10/30/2017 0227    Physical Exam:    VS:  BP (!) 116/58 (BP Location: Right Arm, Patient Position: Sitting, Cuff Size: Normal)   Pulse 60   Ht 5\' 4"  (1.626 m)   Wt 137 lb (62.1 kg)   SpO2 98%   BMI 23.52 kg/m     Wt Readings from Last 3 Encounters:  02/14/18 137 lb (62.1 kg)  12/15/17 133 lb (60.3 kg)  11/27/17 131 lb (59.4 kg)     GEN: Patient is in no acute distress HEENT: Normal NECK: No JVD; No carotid bruits LYMPHATICS: No lymphadenopathy CARDIAC: Hear sounds regular, 2/6 systolic murmur at the apex. RESPIRATORY:  Clear to auscultation without rales, wheezing or rhonchi  ABDOMEN: Soft, non-tender, non-distended MUSCULOSKELETAL:  No edema; No deformity  SKIN: Warm and dry NEUROLOGIC:  Alert and oriented x 3 PSYCHIATRIC:  Normal affect   Signed, Jenean Lindau, MD  02/14/2018 11:51 AM    [place]

## 2018-02-15 LAB — BASIC METABOLIC PANEL
BUN/Creatinine Ratio: 18 (ref 12–28)
BUN: 14 mg/dL (ref 8–27)
CALCIUM: 9.3 mg/dL (ref 8.7–10.3)
CO2: 25 mmol/L (ref 20–29)
CREATININE: 0.79 mg/dL (ref 0.57–1.00)
Chloride: 103 mmol/L (ref 96–106)
GFR calc Af Amer: 93 mL/min/{1.73_m2} (ref >59)
GFR, EST NON AFRICAN AMERICAN: 80 mL/min/{1.73_m2} (ref >59)
Glucose: 96 mg/dL (ref 65–99)
Potassium: 4.8 mmol/L (ref 3.5–5.2)
Sodium: 143 mmol/L (ref 134–144)

## 2018-02-15 LAB — TSH: TSH: 1.53 u[IU]/mL (ref 0.450–4.500)

## 2018-02-20 ENCOUNTER — Telehealth: Payer: Self-pay | Admitting: Cardiology

## 2018-02-20 NOTE — Telephone Encounter (Signed)
Has questions about the monitor she hasn't turned in yet, has lost the diary and the leads keep coming off

## 2018-02-23 ENCOUNTER — Telehealth: Payer: Self-pay | Admitting: *Deleted

## 2018-02-23 NOTE — Telephone Encounter (Signed)
Called pt to schedule new time for monitor. Pt will come in on Wednesday at 9:30 to get monitor put on again. First time she wore monitor fell asleep with heating pad on and it messed up one of the pads so it would not stick any more. Had first monitor erased and registration erased.

## 2018-02-26 ENCOUNTER — Ambulatory Visit: Payer: 59 | Admitting: Cardiology

## 2018-03-05 ENCOUNTER — Other Ambulatory Visit: Payer: Self-pay | Admitting: *Deleted

## 2018-03-05 ENCOUNTER — Encounter (INDEPENDENT_AMBULATORY_CARE_PROVIDER_SITE_OTHER): Payer: 59

## 2018-03-05 DIAGNOSIS — R002 Palpitations: Secondary | ICD-10-CM

## 2018-03-21 ENCOUNTER — Telehealth: Payer: Self-pay

## 2018-03-21 NOTE — Telephone Encounter (Signed)
Called patient and left detailed voice message on patients phone regarding test results. 

## 2018-03-21 NOTE — Telephone Encounter (Signed)
-----   Message from Jenean Lindau, MD sent at 03/21/2018 10:59 AM EST ----- The results of the study is unremarkable. Please inform patient. I will discuss in detail at next appointment. Cc  primary care/referring physician Jenean Lindau, MD 03/21/2018 10:59 AM

## 2018-03-30 ENCOUNTER — Telehealth: Payer: Self-pay | Admitting: Cardiology

## 2018-03-30 NOTE — Telephone Encounter (Signed)
Patient called to say she is having shortness of breath and her heart rate is down in the 40's. Please advise.

## 2018-03-30 NOTE — Telephone Encounter (Signed)
Please advise 

## 2018-03-30 NOTE — Telephone Encounter (Signed)
Called patient and advised patient to go to ER and is she had any other questions to call and she states she needs her rehab okayed and states you and her talked about it last visit.

## 2018-03-30 NOTE — Telephone Encounter (Signed)
She is having this significant symptoms she knows to go to the  emergency room

## 2018-04-02 ENCOUNTER — Other Ambulatory Visit: Payer: Self-pay | Admitting: Cardiology

## 2018-04-04 ENCOUNTER — Telehealth: Payer: Self-pay | Admitting: Emergency Medicine

## 2018-04-04 NOTE — Telephone Encounter (Signed)
I discussed the Holter monitoring findings with the patient at extensive length and answered questions to her satisfaction

## 2018-04-04 NOTE — Telephone Encounter (Signed)
Called patient to give results of monitor. She would like more explanation on this as she felt the palpitations and symptoms she was complaining about while she was wearing the monitor and pressed the button. She would like more explanation from Dr. Geraldo Pitter.   Patient also is continuing to complain of shortness of breath and no energy since August. She reports her heart rate remains low running between 45-49. She would like some recommendation on this as well and she reports she can't afford to go to the emergency room.   She is taking medications as prescribed except she reports no longer taking lisinopril 5 mg, she states Dr. Geraldo Pitter stopped this.   Will route to Dr. Santiago Bur for him to advise.

## 2018-05-07 ENCOUNTER — Other Ambulatory Visit: Payer: Self-pay | Admitting: Cardiology

## 2018-07-10 ENCOUNTER — Other Ambulatory Visit: Payer: Self-pay | Admitting: Cardiology

## 2018-08-23 ENCOUNTER — Other Ambulatory Visit: Payer: Self-pay

## 2018-08-23 ENCOUNTER — Telehealth (INDEPENDENT_AMBULATORY_CARE_PROVIDER_SITE_OTHER): Payer: 59 | Admitting: Cardiology

## 2018-08-23 ENCOUNTER — Encounter: Payer: Self-pay | Admitting: Cardiology

## 2018-08-23 VITALS — BP 143/87 | HR 60

## 2018-08-23 DIAGNOSIS — F17210 Nicotine dependence, cigarettes, uncomplicated: Secondary | ICD-10-CM

## 2018-08-23 DIAGNOSIS — R0600 Dyspnea, unspecified: Secondary | ICD-10-CM

## 2018-08-23 DIAGNOSIS — R0789 Other chest pain: Secondary | ICD-10-CM | POA: Insufficient documentation

## 2018-08-23 DIAGNOSIS — I5181 Takotsubo syndrome: Secondary | ICD-10-CM

## 2018-08-23 DIAGNOSIS — R0609 Other forms of dyspnea: Secondary | ICD-10-CM

## 2018-08-23 HISTORY — DX: Other chest pain: R07.89

## 2018-08-23 HISTORY — DX: Other forms of dyspnea: R06.09

## 2018-08-23 HISTORY — DX: Dyspnea, unspecified: R06.00

## 2018-08-23 MED ORDER — RANOLAZINE ER 500 MG PO TB12: 500.0000 mg | ORAL_TABLET | Freq: Two times a day (BID) | ORAL | 1 refills | Status: DC

## 2018-08-23 NOTE — Addendum Note (Signed)
Addended by: Ashok Norris on: 08/23/2018 10:16 AM   Modules accepted: Orders

## 2018-08-23 NOTE — Progress Notes (Signed)
Virtual Visit via Telephone Note   This visit type was conducted due to national recommendations for restrictions regarding the COVID-19 Pandemic (e.g. social distancing) in an effort to limit this patient's exposure and mitigate transmission in our community.  Due to her co-morbid illnesses, this patient is at least at moderate risk for complications without adequate follow up.  This format is felt to be most appropriate for this patient at this time.  The patient did not have access to video technology/had technical difficulties with video requiring transitioning to audio format only (telephone).  All issues noted in this document were discussed and addressed.  No physical exam could be performed with this format.  Please refer to the patient's chart for her  consent to telehealth for [facility].  Evaluation Performed:  Follow-up visit  This visit type was conducted due to national recommendations for restrictions regarding the COVID-19 Pandemic (e.g. social distancing).  This format is felt to be most appropriate for this patient at this time.  All issues noted in this document were discussed and addressed.  No physical exam was performed (except for noted visual exam findings with Video Visits).  Please refer to the patient's chart (MyChart message for video visits and phone note for telephone visits) for the patient's consent to telehealth for [facility].  Date:  08/23/2018  ID: Kathryn [name], DOB June 11, 1955, MRN [id number]   Patient Location: [place] [address]   Provider location:   [place] Office  PCP:  Ernestene Kiel, MD  Cardiologist:  Jenne Campus, MD     Chief Complaint: I am not doing well  History of Present Illness:    Kathryn [name] is a 63 y.o. female  who presents via audio/video conferencing for a telehealth visit today.  Past medical history significant for stress-induced cardiomyopathy diagnosed in August 2019.   Cardiac catheterization at that time showed 50% LAD as well as 50% circumflex artery.  She recovered completely and last echocardiogram that I see is from October of last year showing normal left ventricular ejection fraction.  Now she started complain of having more shortness of breath there is no swelling of lower extremities no proximal nocturnal dyspnea.  Also described to have some chest pain does pain typically happen at night she can be awakened in the middle of the night with just tight sensation in the chest.  It happens maybe once or twice a month.  She said this is very similar situation that what she had before.  Situation is getting more complicated her husband just was diagnosed with coronavirus infection.  She is living with him in the same household.  So far she is feeling well she does not have any fever but obviously very worried and concerned about that.  She also described to have some irregularity of the heartbeats.  She said when she check her heart rate and the blood pressure monitor she will see irregularity.  Otherwise she only rarely feels something.  No syncope no passing out she does have chronic pains.   The patient does not have symptoms concerning for COVID-19 infection (fever, chills, cough, or new SHORTNESS OF BREATH).    Prior CV studies:   The following studies were reviewed today:  Last echocardiogram from October of last year showed normal ejection fraction     Past Medical History:  Diagnosis Date   Anxiety    Arthritis    Heart attack ([place]) 10/2017  Kidney stone    Pneumonia    Skin cancer (melanoma) ([place])    Stress incontinence    Tobacco use    UTI (urinary tract infection)     Past Surgical History:  Procedure Laterality Date   ABDOMINAL HYSTERECTOMY     cervical cancer   back skin flap     BACK SURGERY     lumbar and cervical   hernia surgery     X2    LEFT HEART CATH AND CORONARY ANGIOGRAPHY N/A 10/30/2017   Procedure:  LEFT HEART CATH AND CORONARY ANGIOGRAPHY;  Surgeon: Lorretta Harp, MD;  Location: [facility];  Service: Cardiovascular;  Laterality: N/A;   MANDIBLE SURGERY     lower jaw   MELANOMA EXCISION     L arm and L leg, 2014   muscle flap surgery     NOSE SURGERY  2012   Lost her nose due to cancer   SKIN CANCER EXCISION     squamous cell cancer , 2012   WRIST SURGERY       Current Meds  Medication Sig   ALPRAZolam (XANAX) 0.25 MG tablet Take 0.25 mg by mouth at bedtime as needed for anxiety.    aspirin EC 81 MG tablet Take 81 mg by mouth daily.   atorvastatin (LIPITOR) 80 MG tablet TAKE ONE (1) TABLET BY MOUTH EVERY DAY AT 6PM   estrogens-methylTEST 1.25-2.5 MG TABS per tablet Take 1 tablet by mouth daily.   furosemide (LASIX) 20 MG tablet TAKE 1 TABLET BY MOUTH ONCE DAILY AS NEEDED.   lisinopril (PRINIVIL,ZESTRIL) 5 MG tablet Take 5 mg by mouth daily.   morphine (MS CONTIN) 15 MG 12 hr tablet Take 15 mg by mouth every 12 (twelve) hours.   Nutritional Supplements (JUICE PLUS FIBRE PO) Take 2 capsules by mouth daily.   omeprazole (PRILOSEC) 20 MG capsule Take 1 capsule (20 mg total) by mouth daily.   oxybutynin (DITROPAN) 5 MG tablet Take 15 mg by mouth 2 (two) times daily.    Oxycodone HCl 10 MG TABS Take 10 mg by mouth 2 (two) times daily as needed for severe pain.    polyethylene glycol (MIRALAX / GLYCOLAX) packet Take 8.6 g by mouth daily.   progesterone (PROMETRIUM) 200 MG capsule Take 200 mg by mouth daily.   traZODone (DESYREL) 50 MG tablet Take 100 mg by mouth at bedtime.       Family History: The patient's family history includes Breast cancer in her mother; Colon polyps in her mother; Diabetes in her father; Heart disease in her father; Kidney disease in her father. There is no history of Colon cancer or Esophageal cancer.   ROS:   Please see the history of present illness.     All other systems reviewed and are negative.   Labs/Other Tests  and Data Reviewed:     Recent Labs: 10/30/2017: ALT 21; B Natriuretic Peptide 300.7 10/31/2017: Hemoglobin 13.5; Platelets 304 02/14/2018: BUN 14; Creatinine, Ser 0.79; Potassium 4.8; Sodium 143; TSH 1.530  Recent Lipid Panel    Component Value Date/Time   CHOL 138 10/30/2017 0227   TRIG 25 10/30/2017 0227   HDL 69 10/30/2017 0227   CHOLHDL 2.0 10/30/2017 0227   VLDL 5 10/30/2017 0227   LDLCALC 64 10/30/2017 0227      Exam:    Vital Signs:  BP (!) 143/87    Pulse 60     Wt Readings from Last 3 Encounters:  02/14/18 137 lb (  62.1 kg)  12/15/17 133 lb (60.3 kg)  11/27/17 131 lb (59.4 kg)     Well nourished, well developed in no acute distress. Alert awake oriented x3 unable to establish video link therefore we took only over the phone not in any distress at the time of my interview  Diagnosis for this visit:   1. Takotsubo cardiomyopathy   2. Atypical chest pain   3. Dyspnea on exertion   4. Cigarette smoker      ASSESSMENT & PLAN:    1.  History of Takotsubo cardiomyopathy.  Last echocardiogram in October of last year showed normalization of left ventricular ejection fraction but he started having some symptoms again obviously more about reactivation of this problem.  Ideally she need to have an echocardiogram done but because she is in quarantine secondary to her husband having active coronavirus infection will not be able to do it for at least 2 weeks.  Luckily she does not have any swelling she does not have any proximal nocturnal dyspnea.  She described to have some chest pain as well.  We debated in length about what to do with the situation I think the ideal medication for her situation right now would be beta-blocker however because of her history of wheezes and the fact that she used to smoke I am reluctant to start this medication I am reluctant to confuse this picture.  I am worried that she may develop coronavirus infection and then will have a situation of shortness  of breath and will have difficult time to establish if this is because of beta-blocker or coronavirus infection.  Therefore, we decided to try ranolazine.  Hopefully that will prevent her from having episode of chest pain if pains actually coming from her heart.  In the future anticipate need to do echocardiogram as well as stress test but we need to wait until she finishes her current teen. 2.  Dyspnea on exertion most likely multifactorial echocardiogram stress test will be done after continue will be finished.  I asked her to let me know if she will feel worse. 3.  Smoking she quit a month ago and I congratulated her for it.  Very interesting and complicated situation.     COVID-19 Education: The signs and symptoms of COVID-19 were discussed with the patient and how to seek care for testing (follow up with PCP or arrange E-visit).  The importance of social distancing was discussed today.  Patient Risk:   After full review of this patients clinical status, I feel that they are at least moderate risk at this time.  Time:   Today, I have spent 22 minutes with the patient with telehealth technology discussing pt health issues.  I spent 10 minutes reviewing her chart before the visit.  Visit was finished at 9:37 AM.    Medication Adjustments/Labs and Tests Ordered: Current medicines are reviewed at length with the patient today.  Concerns regarding medicines are outlined above.  No orders of the defined types were placed in this encounter.  Medication changes: No orders of the defined types were placed in this encounter.    Disposition: Telephone visit next week  Signed, Park Liter, MD, [facility] 08/23/2018 9:37 AM    [place]

## 2018-08-23 NOTE — Patient Instructions (Signed)
Medication Instructions:  Your physician has recommended you make the following change in your medication:   Start: Ranexa 500 mg twice daily   If you need a refill on your cardiac medications before your next appointment, please call your pharmacy.   Lab work: None.  If you have labs (blood work) drawn today and your tests are completely normal, you will receive your results only by: Marland Kitchen MyChart Message (if you have MyChart) OR . A paper copy in the mail If you have any lab test that is abnormal or we need to change your treatment, we will call you to review the results.  Testing/Procedures: None.   Follow-Up: At [facility], you and your health needs are our priority.  As part of our continuing mission to provide you with exceptional heart care, we have created designated Provider Care Teams.  These Care Teams include your primary Cardiologist (physician) and Advanced Practice Providers (APPs -  Physician Assistants and Nurse Practitioners) who all work together to provide you with the care you need, when you need it. You will need a follow up appointment in 1 weeks.  Please call our office 2 months in advance to schedule this appointment.  You may see No primary care provider on file. or another member of our [employer] Provider Team in [place]: Shirlee More, MD . Jyl Heinz, MD  Any Other Special Instructions Will Be Listed Below (If Applicable).  Ranolazine tablets, extended release What is this medicine? RANOLAZINE (ra NOE la zeen) is a heart medicine. It is used to treat chronic chest pain (angina). This medicine must be taken regularly. It will not relieve an acute episode of chest pain. This medicine may be used for other purposes; ask your health care provider or pharmacist if you have questions. COMMON BRAND NAME(S): Ranexa What should I tell my health care provider before I take this medicine? They need to know if you have any of these conditions: -heart  disease -irregular heartbeat -kidney disease -liver disease -low levels of potassium or magnesium in the blood -an unusual or allergic reaction to ranolazine, other medicines, foods, dyes, or preservatives -pregnant or trying to get pregnant -breast-feeding How should I use this medicine? Take this medicine by mouth with a glass of water. Follow the directions on the prescription label. Do not cut, crush, or chew this medicine. Take with or without food. Do not take this medication with grapefruit juice. Take your doses at regular intervals. Do not take your medicine more often then directed. Talk to your pediatrician regarding the use of this medicine in children. Special care may be needed. Overdosage: If you think you have taken too much of this medicine contact a poison control center or emergency room at once. NOTE: This medicine is only for you. Do not share this medicine with others. What if I miss a dose? If you miss a dose, take it as soon as you can. If it is almost time for your next dose, take only that dose. Do not take double or extra doses. What may interact with this medicine? Do not take this medicine with any of the following medications: -antivirals for HIV or AIDS -cerivastatin -certain antibiotics like chloramphenicol, clarithromycin, dalfopristin; quinupristin, isoniazid, rifabutin, rifampin, rifapentine -certain medicines used for cancer like imatinib, nilotinib -certain medicines for fungal infections like fluconazole, itraconazole, ketoconazole, posaconazole, voriconazole -certain medicines for irregular heart beat like dofetilide, dronedarone -certain medicines for seizures like carbamazepine, fosphenytoin, oxcarbazepine, phenobarbital, phenytoin -cisapride -conivaptan -cyclosporine -grapefruit or  grapefruit juice -lumacaftor; ivacaftor -nefazodone -pimozide -quinacrine -St John's wort -thioridazine -ziprasidone This medicine may also interact with the  following medications: -alfuzosin -certain medicines for depression, anxiety, or psychotic disturbances like bupropion, citalopram, fluoxetine, fluphenazine, paroxetine, perphenazine, risperidone, sertraline, trifluoperazine -certain medicines for cholesterol like atorvastatin, lovastatin, simvastatin -certain medicines for stomach problems like octreotide, palonosetron, prochlorperazine -eplerenone -ergot alkaloids like dihydroergotamine, ergonovine, ergotamine, methylergonovine -metformin -nicardipine -other medicines that prolong the QT interval (cause an abnormal heart rhythm) -sirolimus -tacrolimus This list may not describe all possible interactions. Give your health care provider a list of all the medicines, herbs, non-prescription drugs, or dietary supplements you use. Also tell them if you smoke, drink alcohol, or use illegal drugs. Some items may interact with your medicine. What should I watch for while using this medicine? Visit your doctor for regular check ups. Tell your doctor or healthcare professional if your symptoms do not start to get better or if they get worse. This medicine will not relieve an acute attack of angina or chest pain. This medicine can change your heart rhythm. Your health care provider may check your heart rhythm by ordering an electrocardiogram (ECG) while you are taking this medicine. You may get drowsy or dizzy. Do not drive, use machinery, or do anything that needs mental alertness until you know how this medicine affects you. Do not stand or sit up quickly, especially if you are an older patient. This reduces the risk of dizzy or fainting spells. Alcohol may interfere with the effect of this medicine. Avoid alcoholic drinks. If you are scheduled for any medical or dental procedure, tell your healthcare provider that you are taking this medicine. This medicine can interact with other medicines used during surgery. What side effects may I notice from  receiving this medicine? Side effects that you should report to your doctor or health care professional as soon as possible: -allergic reactions like skin rash, itching or hives, swelling of the face, lips, or tongue -breathing problems -changes in vision -fast, irregular or pounding heartbeat -feeling faint or lightheaded, falls -low or high blood pressure -numbness or tingling feelings -ringing in the ears -tremor or shakiness -slow heartbeat (fewer than 50 beats per minute) -swelling of the legs or feet Side effects that usually do not require medical attention (report to your doctor or health care professional if they continue or are bothersome): -constipation -drowsy -dry mouth -headache -nausea or vomiting -stomach upset This list may not describe all possible side effects. Call your doctor for medical advice about side effects. You may report side effects to FDA at 1-800-FDA-1088. Where should I keep my medicine? Keep out of the reach of children. Store at room temperature between 15 and 30 degrees C (59 and 86 degrees F). Throw away any unused medicine after the expiration date. NOTE: This sheet is a summary. It may not cover all possible information. If you have questions about this medicine, talk to your doctor, pharmacist, or health care provider.  2019 Elsevier/Gold Standard (2015-04-02 12:24:15)

## 2018-08-28 DIAGNOSIS — U071 COVID-19: Secondary | ICD-10-CM

## 2018-08-28 HISTORY — DX: COVID-19: U07.1

## 2018-08-30 ENCOUNTER — Other Ambulatory Visit: Payer: Self-pay

## 2018-08-30 ENCOUNTER — Encounter: Payer: Self-pay | Admitting: Cardiology

## 2018-08-30 ENCOUNTER — Telehealth (INDEPENDENT_AMBULATORY_CARE_PROVIDER_SITE_OTHER): Payer: 59 | Admitting: Cardiology

## 2018-08-30 VITALS — BP 115/73 | HR 60

## 2018-08-30 DIAGNOSIS — F17210 Nicotine dependence, cigarettes, uncomplicated: Secondary | ICD-10-CM

## 2018-08-30 DIAGNOSIS — U071 COVID-19: Secondary | ICD-10-CM | POA: Insufficient documentation

## 2018-08-30 DIAGNOSIS — R0609 Other forms of dyspnea: Secondary | ICD-10-CM

## 2018-08-30 DIAGNOSIS — I5181 Takotsubo syndrome: Secondary | ICD-10-CM | POA: Diagnosis not present

## 2018-08-30 HISTORY — DX: COVID-19: U07.1

## 2018-08-30 NOTE — Patient Instructions (Signed)
Medication Instructions:  Your physician recommends that you continue on your current medications as directed. Please refer to the Current Medication list given to you today.  If you need a refill on your cardiac medications before your next appointment, please call your pharmacy.   Lab work: None.  If you have labs (blood work) drawn today and your tests are completely normal, you will receive your results only by: . MyChart Message (if you have MyChart) OR . A paper copy in the mail If you have any lab test that is abnormal or we need to change your treatment, we will call you to review the results.  Testing/Procedures: None.   Follow-Up: At [facility], you and your health needs are our priority.  As part of our continuing mission to provide you with exceptional heart care, we have created designated Provider Care Teams.  These Care Teams include your primary Cardiologist (physician) and Advanced Practice Providers (APPs -  Physician Assistants and Nurse Practitioners) who all work together to provide you with the care you need, when you need it. You will need a follow up appointment in 1 months.  Please call our office 2 months in advance to schedule this appointment.  You may see No primary care provider on file. or another member of our [facility] Provider Team in [place]: Brian Munley, MD . Rajan Revankar, MD  Any Other Special Instructions Will Be Listed Below (If Applicable).     

## 2018-08-30 NOTE — Progress Notes (Signed)
Virtual Visit via Telephone Note   This visit type was conducted due to national recommendations for restrictions regarding the COVID-19 Pandemic (e.g. social distancing) in an effort to limit this patient's exposure and mitigate transmission in our community.  Due to her co-morbid illnesses, this patient is at least at moderate risk for complications without adequate follow up.  This format is felt to be most appropriate for this patient at this time.  The patient did not have access to video technology/had technical difficulties with video requiring transitioning to audio format only (telephone).  All issues noted in this document were discussed and addressed.  No physical exam could be performed with this format.  Please refer to the patient's chart for her  consent to telehealth for [facility].  Evaluation Performed:  Follow-up visit  This visit type was conducted due to national recommendations for restrictions regarding the COVID-19 Pandemic (e.g. social distancing).  This format is felt to be most appropriate for this patient at this time.  All issues noted in this document were discussed and addressed.  No physical exam was performed (except for noted visual exam findings with Video Visits).  Please refer to the patient's chart (MyChart message for video visits and phone note for telephone visits) for the patient's consent to telehealth for [facility].  Date:  08/30/2018  ID: Kathryn [name], DOB 04-04-1955, MRN [id number]   Patient Location: [place] [address]   Provider location:   [place] Office  PCP:  Ernestene Kiel, MD  Cardiologist:  Jenne Campus, MD     Chief Complaint: I am feeling sick  History of Present Illness:    Kathryn [name] is a 63 y.o. female  who presents via audio/video conferencing for a telehealth visit today.  Past medical history significant for stress-induced cardiomyopathy which normalized, she also got  coronary artery disease with 50% narrowing of LAD as well as circumflex.  She contacted [place] about a week ago complaining of feeling short of breath.  Obviously suspicion for recurrence of stress-induced cardiomyopathy was raised.  Plan was to do echocardiogram but started getting more interesting her husband was actively sick with coronavirus infection.  Last Sunday she started feeling poorly became more short of breath started having severe headache she tested positive coronavirus and she does have this infection ongoing right now today she tells me that retrospectively she thinks that her shortness of breath was related to coronavirus infection. Overall she is doing better she is said that she is surprised how much better she is doing her husband is also improving still got some low-grade fever still gets some headache and mild shortness of breath but overall improved   The patient does not have symptoms concerning for COVID-19 infection (fever, chills, cough, or new SHORTNESS OF BREATH).    Prior CV studies:   The following studies were reviewed today:       Past Medical History:  Diagnosis Date  . Anxiety   . Arthritis   . Heart attack ([place]) 10/2017  . Kidney stone   . Pneumonia   . Skin cancer (melanoma) ([place])   . Stress incontinence   . Tobacco use   . UTI (urinary tract infection)     Past Surgical History:  Procedure Laterality Date  . ABDOMINAL HYSTERECTOMY     cervical cancer  . back skin flap    . BACK SURGERY     lumbar and cervical  . hernia  surgery     X2   . LEFT HEART CATH AND CORONARY ANGIOGRAPHY N/A 10/30/2017   Procedure: LEFT HEART CATH AND CORONARY ANGIOGRAPHY;  Surgeon: Lorretta Harp, MD;  Location: [place] [facility];  Service: Cardiovascular;  Laterality: N/A;  . MANDIBLE SURGERY     lower jaw  . MELANOMA EXCISION     L arm and L leg, 2014  . muscle flap surgery    . NOSE SURGERY  2012   Lost her nose due to cancer  . SKIN CANCER EXCISION      squamous cell cancer , 2012  . WRIST SURGERY       Current Meds  Medication Sig  . ALPRAZolam (XANAX) 0.25 MG tablet Take 0.25 mg by mouth at bedtime as needed for anxiety.   Marland Kitchen aspirin EC 81 MG tablet Take 81 mg by mouth daily.  Marland Kitchen atorvastatin (LIPITOR) 80 MG tablet TAKE ONE (1) TABLET BY MOUTH EVERY DAY AT 6PM  . estrogens-methylTEST 1.25-2.5 MG TABS per tablet Take 1 tablet by mouth daily.  . furosemide (LASIX) 20 MG tablet TAKE 1 TABLET BY MOUTH ONCE DAILY AS NEEDED.  Marland Kitchen lisinopril (PRINIVIL,ZESTRIL) 5 MG tablet Take 5 mg by mouth daily.  Marland Kitchen morphine (MS CONTIN) 15 MG 12 hr tablet Take 15 mg by mouth every 12 (twelve) hours.  . Nutritional Supplements (JUICE PLUS FIBRE PO) Take 2 capsules by mouth daily.  Marland Kitchen omeprazole (PRILOSEC) 20 MG capsule Take 1 capsule (20 mg total) by mouth daily.  Marland Kitchen oxybutynin (DITROPAN) 5 MG tablet Take 15 mg by mouth 2 (two) times daily.   . Oxycodone HCl 10 MG TABS Take 10 mg by mouth 2 (two) times daily as needed for severe pain.   . polyethylene glycol (MIRALAX / GLYCOLAX) packet Take 8.6 g by mouth daily.  . progesterone (PROMETRIUM) 200 MG capsule Take 200 mg by mouth daily.  . ranolazine (RANEXA) 500 MG 12 hr tablet Take 1 tablet (500 mg total) by mouth 2 (two) times daily.  . traZODone (DESYREL) 50 MG tablet Take 100 mg by mouth at bedtime.       Family History: The patient's family history includes Breast cancer in her mother; Colon polyps in her mother; Diabetes in her father; Heart disease in her father; Kidney disease in her father. There is no history of Colon cancer or Esophageal cancer.   ROS:   Please see the history of present illness.     All other systems reviewed and are negative.   Labs/Other Tests and Data Reviewed:     Recent Labs: 10/30/2017: ALT 21; B Natriuretic Peptide 300.7 10/31/2017: Hemoglobin 13.5; Platelets 304 02/14/2018: BUN 14; Creatinine, Ser 0.79; Potassium 4.8; Sodium 143; TSH 1.530  Recent Lipid Panel     Component Value Date/Time   CHOL 138 10/30/2017 0227   TRIG 25 10/30/2017 0227   HDL 69 10/30/2017 0227   CHOLHDL 2.0 10/30/2017 0227   VLDL 5 10/30/2017 0227   LDLCALC 64 10/30/2017 0227      Exam:    Vital Signs:  BP 115/73   Pulse 60     Wt Readings from Last 3 Encounters:  02/14/18 137 lb (62.1 kg)  12/15/17 133 lb (60.3 kg)  11/27/17 131 lb (59.4 kg)     Well nourished, well developed in no acute distress. Alert oriented x3 happy to be able to talk to me over the phone unable to establish a video link.  Sounds somewhat exhausted but no short  of breath talking to me.  Diagnosis for this visit:   1. [place] cardiomyopathy   2. COVID-19 virus infection   3. Cigarette smoker   4. Dyspnea on exertion      ASSESSMENT & PLAN:    1.  History of stress-induced cardiomyopathy with normalization.  She will be scheduled eventually to have an echocardiogram after she recovers from coronavirus infection.  I will talk to her in about 3 weeks and she how she does if she is completely back to normal then we may postpone the test if not test may need to be done. 2.  COVID-19 infection active she is recovering. 3.  History of smoking she quit Monday to half ago I congratulated her for it and encouraged her to stay away from it 4.  Dyspnea on exertion look like most likely related to cough and 19 infection  COVID-19 Education: The signs and symptoms of COVID-19 were discussed with the patient and how to seek care for testing (follow up with PCP or arrange E-visit).  The importance of social distancing was discussed today.  Patient Risk:   After full review of this patients clinical status, I feel that they are at least moderate risk at this time.  Time:   Today, I have spent 15 minutes with the patient with telehealth technology discussing pt health issues.  I spent 5 minutes reviewing her chart before the visit.  Visit was finished at 2:15 PM.    Medication Adjustments/Labs  and Tests Ordered: Current medicines are reviewed at length with the patient today.  Concerns regarding medicines are outlined above.  No orders of the defined types were placed in this encounter.  Medication changes: No orders of the defined types were placed in this encounter.    Disposition: Follow-up in 1 month  Signed, Park Liter, MD, [facility] 08/30/2018 2:14 PM    [place]

## 2018-09-19 ENCOUNTER — Telehealth: Payer: Self-pay | Admitting: *Deleted

## 2018-09-19 NOTE — Telephone Encounter (Signed)
Pt has been very out of breath a week after getting over the [place] virus. She would like to see if there is some type of testing that can be done. Dr. Raliegh Ip put pt on Ranexa 500 mg but she is no better. Please advise.

## 2018-09-19 NOTE — Telephone Encounter (Signed)
Telephone call to patient states very SOB since getting over COVID 19. Off quarantine last Friday. Denies CP but has episodes where blood pressure "runs up " for 10-15 minutes. Wants to see Dr. Agustin Cree early. Made appointment for tomorrow 09/20/2018 at 1615. Patient agreeable to plan and knows to go to ED if SOB worsens.

## 2018-09-20 ENCOUNTER — Ambulatory Visit: Payer: 59 | Admitting: Cardiology

## 2018-09-20 ENCOUNTER — Other Ambulatory Visit: Payer: Self-pay

## 2018-09-20 ENCOUNTER — Telehealth (INDEPENDENT_AMBULATORY_CARE_PROVIDER_SITE_OTHER): Payer: 59 | Admitting: Cardiology

## 2018-09-20 ENCOUNTER — Encounter: Payer: Self-pay | Admitting: Cardiology

## 2018-09-20 DIAGNOSIS — R0609 Other forms of dyspnea: Secondary | ICD-10-CM

## 2018-09-20 DIAGNOSIS — R0789 Other chest pain: Secondary | ICD-10-CM

## 2018-09-20 DIAGNOSIS — I5181 Takotsubo syndrome: Secondary | ICD-10-CM

## 2018-09-20 DIAGNOSIS — U071 COVID-19: Secondary | ICD-10-CM

## 2018-09-20 NOTE — Addendum Note (Signed)
Addended by: Polly Cobia A on: 09/20/2018 03:18 PM   Modules accepted: Orders

## 2018-09-20 NOTE — Progress Notes (Signed)
Virtual Visit via Video Note   This visit type was conducted due to national recommendations for restrictions regarding the COVID-19 Pandemic (e.g. social distancing) in an effort to limit this patient's exposure and mitigate transmission in our community.  Due to her co-morbid illnesses, this patient is at least at moderate risk for complications without adequate follow up.  This format is felt to be most appropriate for this patient at this time.  All issues noted in this document were discussed and addressed.  A limited physical exam was performed with this format.  Please refer to the patient's chart for her consent to telehealth for [facility].  Evaluation Performed:  Follow-up visit  This visit type was conducted due to national recommendations for restrictions regarding the COVID-19 Pandemic (e.g. social distancing).  This format is felt to be most appropriate for this patient at this time.  All issues noted in this document were discussed and addressed.  No physical exam was performed (except for noted visual exam findings with Video Visits).  Please refer to the patient's chart (MyChart message for video visits and phone note for telephone visits) for the patient's consent to telehealth for [facility].  Date:  09/20/2018  ID: Kathryn [name], DOB 1955/06/30, MRN [id number]   Patient Location: [place] [address]   Provider location:   [place] Office  PCP:  Ernestene Kiel, MD  Cardiologist:  Jenne Campus, MD     Chief Complaint: I am feeling poorly  History of Present Illness:    Kathryn [name] is a 63 y.o. female  who presents via audio/video conferencing for a telehealth visit today.  Past medical history significant for stress-induced cardiomyopathy diagnosed in 2019, cardiac catheterization at that time showed 50% obstruction in the proximal LAD as well as 50% of circumflex proximal and mid.  Right coronary artery was fine.   Ejection fraction at that time was 25% however after that she normalized her ejection fraction and was doing quite well she contacted [place] few weeks ago because she went to be seen concerned about shortness of breath we were talking about doing a echocardiogram however her husband was diagnosed with coronavirus infection therefore we elected to wait a few weeks before getting her to have an echocardiogram in the meantime she became sick with coronavirus) today's visit is to see how she is doing and she is doing poorly initial symptoms of coronavirus were very mild she lost her sense of smell it all started taste she did have fever and some headache however initially she seems to be recovering quite nicely and now she still having more difficulty breathing.  Weight is stable there is no swelling of lower extremities.  There is no there is still cough but no sputum production.  Denies having any chest pain.   The patient does not have symptoms concerning for COVID-19 infection (fever, chills, cough, or new SHORTNESS OF BREATH).    Prior CV studies:   The following studies were reviewed today:       Past Medical History:  Diagnosis Date  . Anxiety   . Arthritis   . Heart attack ([place]) 10/2017  . Kidney stone   . Pneumonia   . Skin cancer (melanoma) ([place])   . Stress incontinence   . Tobacco use   . UTI (urinary tract infection)     Past Surgical History:  Procedure Laterality Date  . ABDOMINAL HYSTERECTOMY     cervical cancer  .  back skin flap    . BACK SURGERY     lumbar and cervical  . hernia surgery     X2   . LEFT HEART CATH AND CORONARY ANGIOGRAPHY N/A 10/30/2017   Procedure: LEFT HEART CATH AND CORONARY ANGIOGRAPHY;  Surgeon: Lorretta Harp, MD;  Location: [place] [facility];  Service: Cardiovascular;  Laterality: N/A;  . MANDIBLE SURGERY     lower jaw  . MELANOMA EXCISION     L arm and L leg, 2014  . muscle flap surgery    . NOSE SURGERY  2012   Lost her nose due to  cancer  . SKIN CANCER EXCISION     squamous cell cancer , 2012  . WRIST SURGERY       Current Meds  Medication Sig  . ALPRAZolam (XANAX) 0.25 MG tablet Take 0.25 mg by mouth at bedtime as needed for anxiety.   Marland Kitchen aspirin EC 81 MG tablet Take 81 mg by mouth daily.  Marland Kitchen atorvastatin (LIPITOR) 80 MG tablet TAKE ONE (1) TABLET BY MOUTH EVERY DAY AT 6PM  . estrogens-methylTEST 1.25-2.5 MG TABS per tablet Take 1 tablet by mouth daily.  . furosemide (LASIX) 20 MG tablet TAKE 1 TABLET BY MOUTH ONCE DAILY AS NEEDED.  Marland Kitchen morphine (MS CONTIN) 15 MG 12 hr tablet Take 15 mg by mouth every 12 (twelve) hours.  . Nutritional Supplements (JUICE PLUS FIBRE PO) Take 2 capsules by mouth daily.  Marland Kitchen oxybutynin (DITROPAN) 5 MG tablet Take 15 mg by mouth 2 (two) times daily.   . Oxycodone HCl 10 MG TABS Take 10 mg by mouth 2 (two) times daily as needed for severe pain.   . polyethylene glycol (MIRALAX / GLYCOLAX) packet Take 8.6 g by mouth daily.  . progesterone (PROMETRIUM) 200 MG capsule Take 200 mg by mouth daily.  . ranolazine (RANEXA) 500 MG 12 hr tablet Take 1 tablet (500 mg total) by mouth 2 (two) times daily.  . traZODone (DESYREL) 50 MG tablet Take 100 mg by mouth at bedtime.       Family History: The patient's family history includes Breast cancer in her mother; Colon polyps in her mother; Diabetes in her father; Heart disease in her father; Kidney disease in her father. There is no history of Colon cancer or Esophageal cancer.   ROS:   Please see the history of present illness.     All other systems reviewed and are negative.   Labs/Other Tests and Data Reviewed:     Recent Labs: 10/30/2017: ALT 21; B Natriuretic Peptide 300.7 10/31/2017: Hemoglobin 13.5; Platelets 304 02/14/2018: BUN 14; Creatinine, Ser 0.79; Potassium 4.8; Sodium 143; TSH 1.530  Recent Lipid Panel    Component Value Date/Time   CHOL 138 10/30/2017 0227   TRIG 25 10/30/2017 0227   HDL 69 10/30/2017 0227   CHOLHDL 2.0  10/30/2017 0227   VLDL 5 10/30/2017 0227   LDLCALC 64 10/30/2017 0227      Exam:    Vital Signs:  There were no vitals taken for this visit.    Wt Readings from Last 3 Encounters:  02/14/18 137 lb (62.1 kg)  12/15/17 133 lb (60.3 kg)  11/27/17 131 lb (59.4 kg)     Well nourished, well developed in no acute distress. Alert awake and x3 with talking over the video link she is short of breath even talking to me.  She said she cannot do anything without getting short of breath.  Diagnosis for this visit:  1. Takotsubo cardiomyopathy   2. Atypical chest pain   3. Dyspnea on exertion   4. COVID-19 virus infection      ASSESSMENT & PLAN:    1.  History of Takotsubo cardiomyopathy: We will schedule her to have an echocardiogram with may be forced to recheck her coronavirus test before doing the test. 2.  Atypical chest pain with coughing.  Probably because of trauma because of a lot of cough. 3.  Dyspnea on exertion obviously very worried some I will talk to her primary care physician she probably will require chest x-ray or maybe even CT.  I will do echocardiogram to assess her left ventricular ejection fraction taking all precautions against coronavirus infection 4.  COVID-19 infection.  Still struggling with recovery  COVID-19 Education: The signs and symptoms of COVID-19 were discussed with the patient and how to seek care for testing (follow up with PCP or arrange E-visit).  The importance of social distancing was discussed today.  Patient Risk:   After full review of this patients clinical status, I feel that they are at least moderate risk at this time.  Time:   Today, I have spent 22 minutes with the patient with telehealth technology discussing pt health issues.  I spent 5 minutes reviewing her chart before the visit.  Visit was finished at 1:55 PM.    Medication Adjustments/Labs and Tests Ordered: Current medicines are reviewed at length with the patient today.   Concerns regarding medicines are outlined above.  No orders of the defined types were placed in this encounter.  Medication changes: No orders of the defined types were placed in this encounter.    Disposition: Follow-up in 1 month  Signed, Park Liter, MD, [facility] 09/20/2018 1:55 PM    [place]

## 2018-09-20 NOTE — Telephone Encounter (Signed)
Office visit changed to virtual due to recent COVID 19 exposure 09/20/2018 at 1335. Patient agreeable to plan.

## 2018-09-20 NOTE — Patient Instructions (Signed)
Medication Instructions:  Your physician recommends that you continue on your current medications as directed. Please refer to the Current Medication list given to you today.  If you need a refill on your cardiac medications before your next appointment, please call your pharmacy.   Lab work: None If you have labs (blood work) drawn today and your tests are completely normal, you will receive your results only by: Marland Kitchen MyChart Message (if you have MyChart) OR . A paper copy in the mail If you have any lab test that is abnormal or we need to change your treatment, we will call you to review the results.  Testing/Procedures: Your physician has requested that you have an echocardiogram. Echocardiography is a painless test that uses sound waves to create images of your heart. It provides your doctor with information about the size and shape of your heart and how well your heart's chambers and valves are working. This procedure takes approximately one hour. There are no restrictions for this procedure.    Follow-Up: At [facility], you and your health needs are our priority.  As part of our continuing mission to provide you with exceptional heart care, we have created designated Provider Care Teams.  These Care Teams include your primary Cardiologist (physician) and Advanced Practice Providers (APPs -  Physician Assistants and Nurse Practitioners) who all work together to provide you with the care you need, when you need it  Follow-up with Dr. Agustin Cree in 1 month  Any Other Special Instructions Will Be Listed Below (If Applicable). None

## 2018-10-01 ENCOUNTER — Telehealth: Payer: 59 | Admitting: Cardiology

## 2018-10-17 ENCOUNTER — Other Ambulatory Visit: Payer: Self-pay | Admitting: Internal Medicine

## 2018-10-17 DIAGNOSIS — R7989 Other specified abnormal findings of blood chemistry: Secondary | ICD-10-CM

## 2018-10-17 DIAGNOSIS — R0602 Shortness of breath: Secondary | ICD-10-CM

## 2018-10-17 DIAGNOSIS — R079 Chest pain, unspecified: Secondary | ICD-10-CM

## 2018-10-19 ENCOUNTER — Ambulatory Visit
Admission: RE | Admit: 2018-10-19 | Discharge: 2018-10-19 | Disposition: A | Discharge status: Home / Self Care | Payer: 59 | Source: Ambulatory Visit | Attending: Internal Medicine | Admitting: Internal Medicine

## 2018-10-19 DIAGNOSIS — R7989 Other specified abnormal findings of blood chemistry: Secondary | ICD-10-CM

## 2018-10-19 DIAGNOSIS — R079 Chest pain, unspecified: Secondary | ICD-10-CM

## 2018-10-19 DIAGNOSIS — R0602 Shortness of breath: Secondary | ICD-10-CM

## 2018-10-19 MED ORDER — IOPAMIDOL (ISOVUE-370) INJECTION 76%
75.0000 mL | Freq: Once | INTRAVENOUS | Status: AC | PRN
  Administered 2018-10-19: 75 mL via INTRAVENOUS

## 2018-10-31 ENCOUNTER — Telehealth: Payer: 59 | Admitting: Cardiology

## 2018-11-05 ENCOUNTER — Other Ambulatory Visit: Payer: Self-pay | Admitting: Internal Medicine

## 2018-11-05 ENCOUNTER — Ambulatory Visit (INDEPENDENT_AMBULATORY_CARE_PROVIDER_SITE_OTHER): Payer: 59

## 2018-11-05 ENCOUNTER — Other Ambulatory Visit: Payer: Self-pay

## 2018-11-05 DIAGNOSIS — I5181 Takotsubo syndrome: Secondary | ICD-10-CM | POA: Diagnosis not present

## 2018-11-05 DIAGNOSIS — R0609 Other forms of dyspnea: Secondary | ICD-10-CM | POA: Diagnosis not present

## 2018-11-05 DIAGNOSIS — R160 Hepatomegaly, not elsewhere classified: Secondary | ICD-10-CM

## 2018-11-05 NOTE — Progress Notes (Signed)
Complete echocardiogram has been performed.  Jimmy Katti Pelle RDCS, RVT 

## 2018-11-08 ENCOUNTER — Telehealth: Payer: Self-pay | Admitting: *Deleted

## 2018-11-08 DIAGNOSIS — I5181 Takotsubo syndrome: Secondary | ICD-10-CM

## 2018-11-08 NOTE — Telephone Encounter (Signed)
Telephone call to patient. Informed of echo results and need for BMP in the next couple of days to start medicine. Patient verbalized understanding

## 2018-11-08 NOTE — Telephone Encounter (Signed)
-----   Message from Park Liter, MD sent at 11/07/2018  7:25 PM EDT ----- Ef 40-45%, please check chem7 so we can start meds

## 2018-11-10 LAB — BASIC METABOLIC PANEL
BUN/Creatinine Ratio: 10 — ABNORMAL LOW (ref 12–28)
BUN: 9 mg/dL (ref 8–27)
CO2: 26 mmol/L (ref 20–29)
Calcium: 9.3 mg/dL (ref 8.7–10.3)
Chloride: 103 mmol/L (ref 96–106)
Creatinine, Ser: 0.86 mg/dL (ref 0.57–1.00)
GFR calc Af Amer: 83 mL/min/{1.73_m2} (ref >59)
GFR calc non Af Amer: 72 mL/min/{1.73_m2} (ref >59)
Glucose: 89 mg/dL (ref 65–99)
Potassium: 4.7 mmol/L (ref 3.5–5.2)
Sodium: 139 mmol/L (ref 134–144)

## 2018-11-15 ENCOUNTER — Encounter: Payer: Self-pay | Admitting: Cardiology

## 2018-11-15 ENCOUNTER — Other Ambulatory Visit: Payer: Self-pay

## 2018-11-15 ENCOUNTER — Ambulatory Visit (INDEPENDENT_AMBULATORY_CARE_PROVIDER_SITE_OTHER): Payer: 59 | Admitting: Cardiology

## 2018-11-15 VITALS — BP 134/80 | HR 111 | Ht 64.0 in | Wt 135.4 lb

## 2018-11-15 DIAGNOSIS — I5181 Takotsubo syndrome: Secondary | ICD-10-CM

## 2018-11-15 DIAGNOSIS — R0609 Other forms of dyspnea: Secondary | ICD-10-CM

## 2018-11-15 DIAGNOSIS — I2510 Atherosclerotic heart disease of native coronary artery without angina pectoris: Secondary | ICD-10-CM | POA: Insufficient documentation

## 2018-11-15 DIAGNOSIS — R0789 Other chest pain: Secondary | ICD-10-CM

## 2018-11-15 HISTORY — DX: Atherosclerotic heart disease of native coronary artery without angina pectoris: I25.10

## 2018-11-15 MED ORDER — LOSARTAN POTASSIUM 25 MG PO TABS: 25.0000 mg | ORAL_TABLET | Freq: Two times a day (BID) | ORAL | 0 refills | Status: DC

## 2018-11-15 NOTE — Progress Notes (Signed)
Cardiology Office Note:    Date:  11/15/2018   ID:  Kathryn [name], DOB 1956/01/13, MRN [id number]  PCP:  Ernestene Kiel, MD  Cardiologist:  Jenne Campus, MD    Referring MD: Ernestene Kiel, MD   Chief Complaint  Patient presents with  . Follow-up    History of Present Illness:    Kathryn [name] is a 63 y.o. female with history of type possible then normalization.  Cardiac catheterization at that time showed 50% mid LAD 50% circumflex artery.  She started having more shortness of breath also complaining of having some chest pain symptoms with emotion.  Echocardiogram repeated showed ejection fraction 4045%.  She comes here today to talk about this problem.  Denies having any swelling of lower extremities does have some shortness of breath.  She did have COVID-19 infection she is recovered from that.  Denies having any dizziness or passing out.  Past Medical History:  Diagnosis Date  . Anxiety   . Arthritis   . Heart attack ([place]) 10/2017  . Kidney stone   . Pneumonia   . Skin cancer (melanoma) ([place])   . Stress incontinence   . Tobacco use   . UTI (urinary tract infection)     Past Surgical History:  Procedure Laterality Date  . ABDOMINAL HYSTERECTOMY     cervical cancer  . back skin flap    . BACK SURGERY     lumbar and cervical  . hernia surgery     X2   . LEFT HEART CATH AND CORONARY ANGIOGRAPHY N/A 10/30/2017   Procedure: LEFT HEART CATH AND CORONARY ANGIOGRAPHY;  Surgeon: Lorretta Harp, MD;  Location: [place] [facility];  Service: Cardiovascular;  Laterality: N/A;  . MANDIBLE SURGERY     lower jaw  . MELANOMA EXCISION     L arm and L leg, 2014  . muscle flap surgery    . NOSE SURGERY  2012   Lost her nose due to cancer  . SKIN CANCER EXCISION     squamous cell cancer , 2012  . WRIST SURGERY      Current Medications: Current Meds  Medication Sig  . ALPRAZolam (XANAX) 0.25 MG tablet Take 0.25 mg by mouth at bedtime as needed for anxiety.    Marland Kitchen aspirin EC 81 MG tablet Take 81 mg by mouth daily.  Marland Kitchen estrogens-methylTEST 1.25-2.5 MG TABS per tablet Take 1 tablet by mouth daily.  . furosemide (LASIX) 20 MG tablet TAKE 1 TABLET BY MOUTH ONCE DAILY AS NEEDED.  Marland Kitchen morphine (MS CONTIN) 15 MG 12 hr tablet Take 15 mg by mouth every 12 (twelve) hours.  . Nutritional Supplements (JUICE PLUS FIBRE PO) Take 2 capsules by mouth daily.  Marland Kitchen oxybutynin (DITROPAN) 5 MG tablet Take 15 mg by mouth 2 (two) times daily.   . Oxycodone HCl 10 MG TABS Take 10 mg by mouth 2 (two) times daily as needed for severe pain.   . polyethylene glycol (MIRALAX / GLYCOLAX) packet Take 8.6 g by mouth daily.  . progesterone (PROMETRIUM) 200 MG capsule Take 200 mg by mouth daily.  . ranolazine (RANEXA) 500 MG 12 hr tablet Take 1 tablet (500 mg total) by mouth 2 (two) times daily.  . traZODone (DESYREL) 50 MG tablet Take 100 mg by mouth at bedtime.      Allergies:   Cephalosporins   Social History   Socioeconomic History  . Marital status: Married    Spouse name: Not on file  .  Number of children: 2  . Years of education: Not on file  . Highest education level: Not on file  Occupational History  . Not on file  Social Needs  . Financial resource strain: Not on file  . Food insecurity    Worry: Not on file    Inability: Not on file  . Transportation needs    Medical: Not on file    Non-medical: Not on file  Tobacco Use  . Smoking status: Former Smoker    Packs/day: 0.25    Years: 40.00    Pack years: 10.00    Types: Cigarettes  . Smokeless tobacco: Never Used  Substance and Sexual Activity  . Alcohol use: No  . Drug use: No  . Sexual activity: Not on file  Lifestyle  . Physical activity    Days per week: Not on file    Minutes per session: Not on file  . Stress: Not on file  Relationships  . Social Herbalist on phone: Not on file    Gets together: Not on file    Attends religious service: Not on file    Active member of club or  organization: Not on file    Attends meetings of clubs or organizations: Not on file    Relationship status: Not on file  Other Topics Concern  . Not on file  Social History Narrative  . Not on file     Family History: The patient's family history includes Breast cancer in her mother; Colon polyps in her mother; Diabetes in her father; Heart disease in her father; Kidney disease in her father. There is no history of Colon cancer or Esophageal cancer. ROS:   Please see the history of present illness.    All 14 point review of systems negative except as described per history of present illness  EKGs/Labs/Other Studies Reviewed:      Recent Labs: 02/14/2018: TSH 1.530 11/09/2018: BUN 9; Creatinine, Ser 0.86; Potassium 4.7; Sodium 139  Recent Lipid Panel    Component Value Date/Time   CHOL 138 10/30/2017 0227   TRIG 25 10/30/2017 0227   HDL 69 10/30/2017 0227   CHOLHDL 2.0 10/30/2017 0227   VLDL 5 10/30/2017 0227   LDLCALC 64 10/30/2017 0227    Physical Exam:    VS:  BP 134/80   Pulse (!) 111   Ht 5\' 4"  (1.626 m)   Wt 135 lb 6.4 oz (61.4 kg)   SpO2 98%   BMI 23.24 kg/m     Wt Readings from Last 3 Encounters:  11/15/18 135 lb 6.4 oz (61.4 kg)  02/14/18 137 lb (62.1 kg)  12/15/17 133 lb (60.3 kg)     GEN:  Well nourished, well developed in no acute distress HEENT: Normal NECK: No JVD; No carotid bruits LYMPHATICS: No lymphadenopathy CARDIAC: RRR, no murmurs, no rubs, no gallops RESPIRATORY:  Clear to auscultation without rales, wheezing or rhonchi  ABDOMEN: Soft, non-tender, non-distended MUSCULOSKELETAL:  No edema; No deformity  SKIN: Warm and dry LOWER EXTREMITIES: no swelling NEUROLOGIC:  Alert and oriented x 3 PSYCHIATRIC:  Normal affect   ASSESSMENT:    1. Takotsubo cardiomyopathy   2. Dyspnea on exertion   3. Atypical chest pain   4. Coronary artery disease involving native coronary artery of native heart without angina pectoris    PLAN:    In  order of problems listed above:  1. Cardiomyopathy with ejection fraction now 4045%.  Previously Takotsubo with normalization.  I will initiate losartan 25 twice daily today Chem-7 will be done next week.  I will see her back in my office in about 2 to 3 weeks if she is doing fine we will start carvedilol.  There is some issue with her blood pressure being high which hopefully will be better managed with addition of those medications. 2. Dyslipidemia.  His her cholesterol looks quite reasonable her LDL last year was 64 but in spite of that she does have blockages in 2 of her arteries.  Therefore I put her on Lipitor back.  She was taking 80 mg she is willing to try 40 today now. 3. Coronary artery disease a stress test will be done to rule out inducible ischemia.     Medication Adjustments/Labs and Tests Ordered: Current medicines are reviewed at length with the patient today.  Concerns regarding medicines are outlined above.  No orders of the defined types were placed in this encounter.  Medication changes: No orders of the defined types were placed in this encounter.   Signed, Park Liter, MD, [facility] 11/15/2018 3:12 PM    [place]

## 2018-11-15 NOTE — Patient Instructions (Signed)
Medication Instructions:  Your physician has recommended you make the following change in your medication:  START Losartan 25 mg two times daily  If you need a refill on your cardiac medications before your next appointment, please call your pharmacy.   Lab work: Your physician recommends that you return for lab work in: 1 week for a BMP  If you have labs (blood work) drawn today and your tests are completely normal, you will receive your results only by: Marland Kitchen MyChart Message (if you have MyChart) OR . A paper copy in the mail If you have any lab test that is abnormal or we need to change your treatment, we will call you to review the results.  Testing/Procedures: Your physician has requested that you have a lexiscan myoview. For further information please visit [email or web address]. Please follow instruction sheet, as given.  Follow-Up: At [facility], you and your health needs are our priority.  As part of our continuing mission to provide you with exceptional heart care, we have created designated Provider Care Teams.  These Care Teams include your primary Cardiologist (physician) and Advanced Practice Providers (APPs -  Physician Assistants and Nurse Practitioners) who all work together to provide you with the care you need, when you need it. You will need a follow up appointment in 3 weeks.   You may see  Jenne Campus or another member of our [employer] Provider Team in [place]: Shirlee More, MD . Jyl Heinz, MD  Any Other Special Instructions Will Be Listed Below (If Applicable).  Cardiac Nuclear Scan A cardiac nuclear scan is a test that is done to check the flow of blood to your heart. It is done when you are resting and when you are exercising. The test looks for problems such as:  Not enough blood reaching a portion of the heart.  The heart muscle not working as it should. You may need this test if:  You have heart disease.  You have had lab results that are  not normal.  You have had heart surgery or a balloon procedure to open up blocked arteries (angioplasty).  You have chest pain.  You have shortness of breath. In this test, a special dye (tracer) is put into your bloodstream. The tracer will travel to your heart. A camera will then take pictures of your heart to see how the tracer moves through your heart. This test is usually done at a hospital and takes 2-4 hours. Tell a doctor about:  Any allergies you have.  All medicines you are taking, including vitamins, herbs, eye drops, creams, and over-the-counter medicines.  Any problems you or family members have had with anesthetic medicines.  Any blood disorders you have.  Any surgeries you have had.  Any medical conditions you have.  Whether you are pregnant or may be pregnant. What are the risks? Generally, this is a safe test. However, problems may occur, such as:  Serious chest pain and heart attack. This is only a risk if the stress portion of the test is done.  Rapid heartbeat.  A feeling of warmth in your chest. This feeling usually does not last long.  Allergic reaction to the tracer. What happens before the test?  Ask your doctor about changing or stopping your normal medicines. This is important.  Follow instructions from your doctor about what you cannot eat or drink.  Remove your jewelry on the day of the test. What happens during the test?  An IV tube will be inserted  into one of your veins.  Your doctor will give you a small amount of tracer through the IV tube.  You will wait for 20-40 minutes while the tracer moves through your bloodstream.  Your heart will be monitored with an electrocardiogram (ECG).  You will lie down on an exam table.  Pictures of your heart will be taken for about 15-20 minutes.  You may also have a stress test. For this test, one of these things may be done: ? You will be asked to exercise on a treadmill or a stationary bike.  ? You will be given medicines that will make your heart work harder. This is done if you are unable to exercise.  When blood flow to your heart has peaked, a tracer will again be given through the IV tube.  After 20-40 minutes, you will get back on the exam table. More pictures will be taken of your heart.  Depending on the tracer that is used, more pictures may need to be taken 3-4 hours later.  Your IV tube will be removed when the test is over. The test may vary among doctors and hospitals. What happens after the test?  Ask your doctor: ? Whether you can return to your normal schedule, including diet, activities, and medicines. ? Whether you should drink more fluids. This will help to remove the tracer from your body. Drink enough fluid to keep your pee (urine) pale yellow.  Ask your doctor, or the department that is doing the test: ? When will my results be ready? ? How will I get my results? Summary  A cardiac nuclear scan is a test that is done to check the flow of blood to your heart.  Tell your doctor whether you are pregnant or may be pregnant.  Before the test, ask your doctor about changing or stopping your normal medicines. This is important.  Ask your doctor whether you can return to your normal activities. You may be asked to drink more fluids. This information is not intended to replace advice given to you by your health care provider. Make sure you discuss any questions you have with your health care provider. Document Released: 08/14/2017 Document Revised: 06/20/2018 Document Reviewed: 08/14/2017 Elsevier Patient Education  2020 [employer].

## 2018-11-15 NOTE — Addendum Note (Signed)
Addended by: Aleatha Borer on: 11/15/2018 03:29 PM   Modules accepted: Orders

## 2018-11-20 ENCOUNTER — Other Ambulatory Visit: Payer: Self-pay

## 2018-11-20 ENCOUNTER — Ambulatory Visit (INDEPENDENT_AMBULATORY_CARE_PROVIDER_SITE_OTHER): Payer: 59

## 2018-11-20 DIAGNOSIS — R0789 Other chest pain: Secondary | ICD-10-CM

## 2018-11-20 DIAGNOSIS — I2510 Atherosclerotic heart disease of native coronary artery without angina pectoris: Secondary | ICD-10-CM | POA: Diagnosis not present

## 2018-11-20 DIAGNOSIS — I5181 Takotsubo syndrome: Secondary | ICD-10-CM

## 2018-11-20 DIAGNOSIS — R0609 Other forms of dyspnea: Secondary | ICD-10-CM

## 2018-11-20 LAB — MYOCARDIAL PERFUSION IMAGING
LV dias vol: 88 mL (ref 46–106)
LV sys vol: 35 mL
Peak HR: 80 {beats}/min
Rest HR: 56 {beats}/min
SDS: 0
SRS: 2
SSS: 2
TID: 1.19

## 2018-11-20 MED ORDER — TECHNETIUM TC 99M TETROFOSMIN IV KIT
10.7000 | PACK | Freq: Once | INTRAVENOUS | Status: AC | PRN
  Administered 2018-11-20: 10.7 via INTRAVENOUS

## 2018-11-20 MED ORDER — REGADENOSON 0.4 MG/5ML IV SOLN
0.4000 mg | Freq: Once | INTRAVENOUS | Status: AC
  Administered 2018-11-20: 0.4 mg via INTRAVENOUS

## 2018-11-20 MED ORDER — TECHNETIUM TC 99M TETROFOSMIN IV KIT
32.9000 | PACK | Freq: Once | INTRAVENOUS | Status: AC | PRN
  Administered 2018-11-20: 32.9 via INTRAVENOUS

## 2018-12-03 ENCOUNTER — Other Ambulatory Visit: Payer: 59

## 2018-12-05 ENCOUNTER — Ambulatory Visit
Admission: RE | Admit: 2018-12-05 | Discharge: 2018-12-05 | Disposition: A | Discharge status: Home / Self Care | Payer: 59 | Source: Ambulatory Visit | Attending: Internal Medicine | Admitting: Internal Medicine

## 2018-12-05 ENCOUNTER — Other Ambulatory Visit: Payer: Self-pay

## 2018-12-05 DIAGNOSIS — R160 Hepatomegaly, not elsewhere classified: Secondary | ICD-10-CM

## 2018-12-05 MED ORDER — GADOBENATE DIMEGLUMINE 529 MG/ML IV SOLN
12.0000 mL | Freq: Once | INTRAVENOUS | Status: AC | PRN
  Administered 2018-12-05: 09:00:00 12 mL via INTRAVENOUS

## 2018-12-06 LAB — BASIC METABOLIC PANEL
BUN/Creatinine Ratio: 11 — ABNORMAL LOW (ref 12–28)
BUN: 8 mg/dL (ref 8–27)
CO2: 27 mmol/L (ref 20–29)
Calcium: 9.3 mg/dL (ref 8.7–10.3)
Chloride: 105 mmol/L (ref 96–106)
Creatinine, Ser: 0.71 mg/dL (ref 0.57–1.00)
GFR calc Af Amer: 105 mL/min/{1.73_m2} (ref >59)
GFR calc non Af Amer: 91 mL/min/{1.73_m2} (ref >59)
Glucose: 96 mg/dL (ref 65–99)
Potassium: 4.3 mmol/L (ref 3.5–5.2)
Sodium: 144 mmol/L (ref 134–144)

## 2018-12-13 ENCOUNTER — Ambulatory Visit (INDEPENDENT_AMBULATORY_CARE_PROVIDER_SITE_OTHER): Payer: 59 | Admitting: Cardiology

## 2018-12-13 ENCOUNTER — Encounter: Payer: Self-pay | Admitting: Cardiology

## 2018-12-13 ENCOUNTER — Other Ambulatory Visit: Payer: Self-pay

## 2018-12-13 VITALS — BP 116/74 | HR 81 | Wt 138.2 lb

## 2018-12-13 DIAGNOSIS — I5181 Takotsubo syndrome: Secondary | ICD-10-CM

## 2018-12-13 DIAGNOSIS — I2510 Atherosclerotic heart disease of native coronary artery without angina pectoris: Secondary | ICD-10-CM

## 2018-12-13 DIAGNOSIS — R0600 Dyspnea, unspecified: Secondary | ICD-10-CM

## 2018-12-13 DIAGNOSIS — R0609 Other forms of dyspnea: Secondary | ICD-10-CM

## 2018-12-13 NOTE — Addendum Note (Signed)
Addended by: Ashok Norris on: 12/13/2018 11:36 AM   Modules accepted: Orders

## 2018-12-13 NOTE — Patient Instructions (Signed)
Medication Instructions:  Your physician recommends that you continue on your current medications as directed. Please refer to the Current Medication list given to you today.  If you need a refill on your cardiac medications before your next appointment, please call your pharmacy.   Lab work: None.  If you have labs (blood work) drawn today and your tests are completely normal, you will receive your results only by: . MyChart Message (if you have MyChart) OR . A paper copy in the mail If you have any lab test that is abnormal or we need to change your treatment, we will call you to review the results.  Testing/Procedures: Your physician has recommended that you wear a holter monitor. Holter monitors are medical devices that record the heart's electrical activity. Doctors most often use these monitors to diagnose arrhythmias. Arrhythmias are problems with the speed or rhythm of the heartbeat. The monitor is a small, portable device. You can wear one while you do your normal daily activities. This is usually used to diagnose what is causing palpitations/syncope (passing out). Wear for 7 days.     Follow-Up: At [facility], you and your health needs are our priority.  As part of our continuing mission to provide you with exceptional heart care, we have created designated Provider Care Teams.  These Care Teams include your primary Cardiologist (physician) and Advanced Practice Providers (APPs -  Physician Assistants and Nurse Practitioners) who all work together to provide you with the care you need, when you need it. You will need a follow up appointment in 6 weeks.  Please call our office 2 months in advance to schedule this appointment.  You may see No primary care provider on file. or another member of our [facility] Provider Team in [place]: Brian Munley, MD . Rajan Revankar, MD  Any Other Special Instructions Will Be Listed Below (If Applicable).     

## 2018-12-13 NOTE — Progress Notes (Signed)
Cardiology Office Note:    Date:  12/13/2018   ID:  Kathryn [name], DOB 1955-10-17, MRN [id number]  PCP:  Ernestene Kiel, MD  Cardiologist:  Jenne Campus, MD    Referring MD: Ernestene Kiel, MD   Chief Complaint  Patient presents with  . 3 week follow up  Doing well  History of Present Illness:    Kathryn [name] is a 63 y.o. female with history of Takotsubo at that time she got cardiac catheterization done that was in 2018 which showed 50% of LAD and the circumflex artery.  Recently she had echocardiogram done which showed ejection fraction 40 to 45%.  We did stress test to rule out significant coronary artery disease.  That was negative.  She comes today to talk about it.  In the meantime I started her on losartan, intention of today's visit was to start on carvedilol.  However she does have a Fitbit watch which alarmed her about heart rate being less than 50 or more than 100 and within last week or so she got multiple alarms of her heart rate being less than 50.  Therefore, I think the best option for this situation will be to put 0 patch on her for a week.  At that we will to see what the heart rate is and see if she required any medications for it.  And the hope is that they will be able to put on beta-blocker.  Overall she is doing well she was on the beach within last week she enjoyed walking around and doing well.  Past Medical History:  Diagnosis Date  . Anxiety   . Arthritis   . Heart attack ([place]) 10/2017  . Kidney stone   . Pneumonia   . Skin cancer (melanoma) ([place])   . Stress incontinence   . Tobacco use   . UTI (urinary tract infection)     Past Surgical History:  Procedure Laterality Date  . ABDOMINAL HYSTERECTOMY     cervical cancer  . back skin flap    . BACK SURGERY     lumbar and cervical  . hernia surgery     X2   . LEFT HEART CATH AND CORONARY ANGIOGRAPHY N/A 10/30/2017   Procedure: LEFT HEART CATH AND CORONARY ANGIOGRAPHY;  Surgeon: Lorretta Harp, MD;  Location: [facility];  Service: Cardiovascular;  Laterality: N/A;  . MANDIBLE SURGERY     lower jaw  . MELANOMA EXCISION     L arm and L leg, 2014  . muscle flap surgery    . NOSE SURGERY  2012   Lost her nose due to cancer  . SKIN CANCER EXCISION     squamous cell cancer , 2012  . WRIST SURGERY      Current Medications: Current Meds  Medication Sig  . ALPRAZolam (XANAX) 0.25 MG tablet Take 0.25 mg by mouth at bedtime as needed for anxiety.   Marland Kitchen aspirin EC 81 MG tablet Take 81 mg by mouth daily.  Marland Kitchen estrogens-methylTEST 1.25-2.5 MG TABS per tablet Take 1 tablet by mouth daily.  . furosemide (LASIX) 20 MG tablet TAKE 1 TABLET BY MOUTH ONCE DAILY AS NEEDED.  Marland Kitchen losartan (COZAAR) 25 MG tablet Take 1 tablet (25 mg total) by mouth 2 (two) times daily.  Marland Kitchen morphine (MS CONTIN) 30 MG 12 hr tablet Take 30 mg by mouth every 8 (eight) hours as needed.   . Nutritional Supplements (JUICE PLUS FIBRE PO) Take 2 capsules by  mouth daily.  Marland Kitchen oxybutynin (DITROPAN) 5 MG tablet Take 15 mg by mouth 2 (two) times daily.   . polyethylene glycol (MIRALAX / GLYCOLAX) packet Take 8.6 g by mouth daily.  . progesterone (PROMETRIUM) 200 MG capsule Take 200 mg by mouth daily.  . ranolazine (RANEXA) 500 MG 12 hr tablet Take 1 tablet (500 mg total) by mouth 2 (two) times daily.  . traZODone (DESYREL) 50 MG tablet Take 100 mg by mouth at bedtime.      Allergies:   Cephalosporins   Social History   Socioeconomic History  . Marital status: Married    Spouse name: Not on file  . Number of children: 2  . Years of education: Not on file  . Highest education level: Not on file  Occupational History  . Not on file  Social Needs  . Financial resource strain: Not on file  . Food insecurity    Worry: Not on file    Inability: Not on file  . Transportation needs    Medical: Not on file    Non-medical: Not on file  Tobacco Use  . Smoking status: Former Smoker    Packs/day: 0.25    Years:  40.00    Pack years: 10.00    Types: Cigarettes  . Smokeless tobacco: Never Used  Substance and Sexual Activity  . Alcohol use: No  . Drug use: No  . Sexual activity: Not on file  Lifestyle  . Physical activity    Days per week: Not on file    Minutes per session: Not on file  . Stress: Not on file  Relationships  . Social Herbalist on phone: Not on file    Gets together: Not on file    Attends religious service: Not on file    Active member of club or organization: Not on file    Attends meetings of clubs or organizations: Not on file    Relationship status: Not on file  Other Topics Concern  . Not on file  Social History Narrative  . Not on file     Family History: The patient's family history includes Breast cancer in her mother; Colon polyps in her mother; Diabetes in her father; Heart disease in her father; Kidney disease in her father. There is no history of Colon cancer or Esophageal cancer. ROS:   Please see the history of present illness.    All 14 point review of systems negative except as described per history of present illness  EKGs/Labs/Other Studies Reviewed:      Recent Labs: 02/14/2018: TSH 1.530 12/05/2018: BUN 8; Creatinine, Ser 0.71; Potassium 4.3; Sodium 144  Recent Lipid Panel    Component Value Date/Time   CHOL 138 10/30/2017 0227   TRIG 25 10/30/2017 0227   HDL 69 10/30/2017 0227   CHOLHDL 2.0 10/30/2017 0227   VLDL 5 10/30/2017 0227   LDLCALC 64 10/30/2017 0227    Physical Exam:    VS:  BP 116/74   Pulse 81   Wt 138 lb 3.2 oz (62.7 kg)   SpO2 98%   BMI 23.72 kg/m     Wt Readings from Last 3 Encounters:  12/13/18 138 lb 3.2 oz (62.7 kg)  11/20/18 135 lb (61.2 kg)  11/15/18 135 lb 6.4 oz (61.4 kg)     GEN:  Well nourished, well developed in no acute distress HEENT: Normal NECK: No JVD; No carotid bruits LYMPHATICS: No lymphadenopathy CARDIAC: RRR, no murmurs, no rubs, no  gallops RESPIRATORY:  Clear to auscultation  without rales, wheezing or rhonchi  ABDOMEN: Soft, non-tender, non-distended MUSCULOSKELETAL:  No edema; No deformity  SKIN: Warm and dry LOWER EXTREMITIES: no swelling NEUROLOGIC:  Alert and oriented x 3 PSYCHIATRIC:  Normal affect   ASSESSMENT:    1. Coronary artery disease involving native coronary artery of native heart without angina pectoris   2. Takotsubo cardiomyopathy   3. Dyspnea on exertion    PLAN:    In order of problems listed above:  1. Coronary artery disease.  Stress test negative.  50% LAD and circumflex cardiac catheterization from 2018. 2. History of Takotsubo cardiomyopathy now ejection fraction 4045% gradually trying to put on the right medication. 3. Dyspnea on exertion improved. 4. Bradycardia noted by Fitbit.  We will put monitor on to decide about therapy for it.   Medication Adjustments/Labs and Tests Ordered: Current medicines are reviewed at length with the patient today.  Concerns regarding medicines are outlined above.  No orders of the defined types were placed in this encounter.  Medication changes: No orders of the defined types were placed in this encounter.   Signed, Park Liter, MD, [facility] 12/13/2018 11:30 AM    [place]

## 2018-12-19 ENCOUNTER — Other Ambulatory Visit: Payer: Self-pay | Admitting: Cardiology

## 2019-01-10 ENCOUNTER — Ambulatory Visit (INDEPENDENT_AMBULATORY_CARE_PROVIDER_SITE_OTHER): Payer: 59

## 2019-01-10 DIAGNOSIS — R0600 Dyspnea, unspecified: Secondary | ICD-10-CM

## 2019-01-18 ENCOUNTER — Other Ambulatory Visit: Payer: Self-pay | Admitting: Cardiology

## 2019-01-29 ENCOUNTER — Ambulatory Visit: Payer: 59 | Admitting: Cardiology

## 2019-02-18 ENCOUNTER — Other Ambulatory Visit: Payer: Self-pay | Admitting: Cardiology

## 2019-02-19 ENCOUNTER — Encounter: Payer: Self-pay | Admitting: Cardiology

## 2019-02-19 ENCOUNTER — Other Ambulatory Visit: Payer: Self-pay

## 2019-02-19 ENCOUNTER — Ambulatory Visit (INDEPENDENT_AMBULATORY_CARE_PROVIDER_SITE_OTHER): Payer: 59 | Admitting: Cardiology

## 2019-02-19 VITALS — BP 122/68 | HR 68 | Ht 64.0 in | Wt 136.4 lb

## 2019-02-19 DIAGNOSIS — I5181 Takotsubo syndrome: Secondary | ICD-10-CM

## 2019-02-19 DIAGNOSIS — R0600 Dyspnea, unspecified: Secondary | ICD-10-CM | POA: Diagnosis not present

## 2019-02-19 DIAGNOSIS — R0609 Other forms of dyspnea: Secondary | ICD-10-CM

## 2019-02-19 DIAGNOSIS — I2510 Atherosclerotic heart disease of native coronary artery without angina pectoris: Secondary | ICD-10-CM | POA: Diagnosis not present

## 2019-02-19 DIAGNOSIS — R0789 Other chest pain: Secondary | ICD-10-CM | POA: Diagnosis not present

## 2019-02-19 MED ORDER — ENTRESTO 24-26 MG PO TABS: 1.0000 | ORAL_TABLET | Freq: Two times a day (BID) | ORAL | 2 refills | Status: DC

## 2019-02-19 NOTE — Patient Instructions (Signed)
Medication Instructions:  Your physician has recommended you make the following change in your medication:  STOP: Losartan   START: Entresto 24/26 mg twice daily   *If you need a refill on your cardiac medications before your next appointment, please call your pharmacy*  Lab Work: Your physician recommends that you return for lab work today: bmp   If you have labs (blood work) drawn today and your tests are completely normal, you will receive your results only by: Marland Kitchen MyChart Message (if you have MyChart) OR . A paper copy in the mail If you have any lab test that is abnormal or we need to change your treatment, we will call you to review the results.  Testing/Procedures: None.   Follow-Up: At [facility], you and your health needs are our priority.  As part of our continuing mission to provide you with exceptional heart care, we have created designated Provider Care Teams.  These Care Teams include your primary Cardiologist (physician) and Advanced Practice Providers (APPs -  Physician Assistants and Nurse Practitioners) who all work together to provide you with the care you need, when you need it.  Your next appointment:   1 month(s)  The format for your next appointment:   In Person  Provider:   Jenne Campus, MD  Other Instructions   Sacubitril; Valsartan oral tablet What is this medicine? SACUBITRIL; VALSARTAN (sak UE bi tril; val SAR tan) is a combination of 2 drugs used to reduce the risk of death and hospitalizations in people with long-lasting heart failure. It is usually used with other medicines to treat heart failure. This medicine may be used for other purposes; ask your health care provider or pharmacist if you have questions. COMMON BRAND NAME(S): Entresto What should I tell my health care provider before I take this medicine? They need to know if you have any of these conditions:  diabetes and take a medicine that contains aliskiren  kidney  disease  liver disease  an unusual or allergic reaction to sacubitril; valsartan, drugs called angiotensin converting enzyme (ACE) inhibitors, angiotensin II receptor blockers (ARBs), other medicines, foods, dyes, or preservatives  pregnant or trying to get pregnant  breast-feeding How should I use this medicine? Take this medicine by mouth with a glass of water. Follow the directions on the prescription label. You can take it with or without food. If it upsets your stomach, take it with food. Take your medicine at regular intervals. Do not take it more often than directed. Do not stop taking except on your doctor's advice. Do not take this medicine for at least 36 hours before or after you take an ACE inhibitor medicine. Talk to your health care provider if you are not sure if you take an ACE inhibitor. Talk to your pediatrician regarding the use of this medicine in children. Special care may be needed. Overdosage: If you think you have taken too much of this medicine contact a poison control center or emergency room at once. NOTE: This medicine is only for you. Do not share this medicine with others. What if I miss a dose? If you miss a dose, take it as soon as you can. If it is almost time for next dose, take only that dose. Do not take double or extra doses. What may interact with this medicine? Do not take this medicine with any of the following medicines:  aliskiren if you have diabetes  angiotensin-converting enzyme (ACE) inhibitors, like benazepril, captopril, enalapril, fosinopril, lisinopril, or ramipril  This medicine may also interact with the following medicines:  angiotensin II receptor blockers (ARBs) like azilsartan, candesartan, eprosartan, irbesartan, losartan, olmesartan, telmisartan, or valsartan  lithium  NSAIDS, medicines for pain and inflammation, like ibuprofen or naproxen  potassium-sparing diuretics like amiloride, spironolactone, and triamterene  potassium  supplements This list may not describe all possible interactions. Give your health care provider a list of all the medicines, herbs, non-prescription drugs, or dietary supplements you use. Also tell them if you smoke, drink alcohol, or use illegal drugs. Some items may interact with your medicine. What should I watch for while using this medicine? Tell your doctor or healthcare professional if your symptoms do not start to get better or if they get worse. Do not become pregnant while taking this medicine. Women should inform their doctor if they wish to become pregnant or think they might be pregnant. There is a potential for serious side effects to an unborn child. Talk to your health care professional or pharmacist for more information. You may get dizzy. Do not drive, use machinery, or do anything that needs mental alertness until you know how this medicine affects you. Do not stand or sit up quickly, especially if you are an older patient. This reduces the risk of dizzy or fainting spells. Avoid alcoholic drinks; they can make you more dizzy. What side effects may I notice from receiving this medicine? Side effects that you should report to your doctor or health care professional as soon as possible:  allergic reactions like skin rash, itching or hives, swelling of the face, lips, or tongue  signs and symptoms of increased potassium like muscle weakness; chest pain; or fast, irregular heartbeat  signs and symptoms of kidney injury like trouble passing urine or change in the amount of urine  signs and symptoms of low blood pressure like feeling dizzy or lightheaded, or if you develop extreme fatigue Side effects that usually do not require medical attention (report to your doctor or health care professional if they continue or are bothersome):  cough This list may not describe all possible side effects. Call your doctor for medical advice about side effects. You may report side effects to FDA  at 1-800-FDA-1088. Where should I keep my medicine? Keep out of the reach of children. Store at room temperature between 15 and 30 degrees C (59 and 86 degrees F). Throw away any unused medicine after the expiration date. NOTE: This sheet is a summary. It may not cover all possible information. If you have questions about this medicine, talk to your doctor, pharmacist, or health care provider.  2020 Elsevier/Gold Standard (2015-04-15 13:54:19)

## 2019-02-19 NOTE — Progress Notes (Signed)
Cardiology Office Note:    Date:  02/19/2019   ID:  TYPHANI PARATORE, DOB 15-Aug-1955, MRN [id number]  PCP:  Ernestene Kiel, MD  Cardiologist:  Jenne Campus, MD    Referring MD: Ernestene Kiel, MD   Chief Complaint  Patient presents with  . 6 week follow up  I had episode of shortness of breath few nights ago  History of Present Illness:    Kathryn [name] is a 63 y.o. female with cardiomyopathy history of Takotsubo, cardiac catheterization at that time showed 50% LAD, recent stress test showed no evidence of ischemia.  Her ejection fraction 40 to 45%, and try to put her on right medications.  There was an issue of bradycardia she is supposed to wear monitor for a week to see if she got any significant bradycardia but I do not have the report of this test.  We will try to get it in the meantime today we will check Chem-7 I will put her on Entresto 24/26 twice daily.  Past Medical History:  Diagnosis Date  . Anxiety   . Arthritis   . Heart attack ([place]) 10/2017  . Kidney stone   . Pneumonia   . Skin cancer (melanoma) ([place])   . Stress incontinence   . Tobacco use   . UTI (urinary tract infection)     Past Surgical History:  Procedure Laterality Date  . ABDOMINAL HYSTERECTOMY     cervical cancer  . back skin flap    . BACK SURGERY     lumbar and cervical  . hernia surgery     X2   . LEFT HEART CATH AND CORONARY ANGIOGRAPHY N/A 10/30/2017   Procedure: LEFT HEART CATH AND CORONARY ANGIOGRAPHY;  Surgeon: Lorretta Harp, MD;  Location: [facility];  Service: Cardiovascular;  Laterality: N/A;  . MANDIBLE SURGERY     lower jaw  . MELANOMA EXCISION     L arm and L leg, 2014  . muscle flap surgery    . NOSE SURGERY  2012   Lost her nose due to cancer  . SKIN CANCER EXCISION     squamous cell cancer , 2012  . WRIST SURGERY      Current Medications: Current Meds  Medication Sig  . ALPRAZolam (XANAX) 0.25 MG tablet Take 0.25 mg by mouth at bedtime as  needed for anxiety.   Marland Kitchen aspirin EC 81 MG tablet Take 81 mg by mouth daily.  . ATORVASTATIN CALCIUM PO Take by mouth.  . estrogens-methylTEST 1.25-2.5 MG TABS per tablet Take 1 tablet by mouth daily.  . furosemide (LASIX) 20 MG tablet TAKE 1 TABLET BY MOUTH ONCE DAILY AS NEEDED.  Marland Kitchen losartan (COZAAR) 25 MG tablet TAKE 1 TABLET BY MOUTH TWICE DAILY.  Marland Kitchen morphine (MS CONTIN) 30 MG 12 hr tablet Take 30 mg by mouth every 8 (eight) hours as needed.   . Nutritional Supplements (JUICE PLUS FIBRE PO) Take 2 capsules by mouth daily.  Marland Kitchen oxybutynin (DITROPAN) 5 MG tablet Take 15 mg by mouth 2 (two) times daily.   . polyethylene glycol (MIRALAX / GLYCOLAX) packet Take 8.6 g by mouth daily.  . progesterone (PROMETRIUM) 200 MG capsule Take 200 mg by mouth daily.  . ranolazine (RANEXA) 500 MG 12 hr tablet Take 1 tablet (500 mg total) by mouth 2 (two) times daily.  . traZODone (DESYREL) 50 MG tablet Take 100 mg by mouth at bedtime.      Allergies:   Cephalosporins  Social History   Socioeconomic History  . Marital status: Married    Spouse name: Not on file  . Number of children: 2  . Years of education: Not on file  . Highest education level: Not on file  Occupational History  . Not on file  Social Needs  . Financial resource strain: Not on file  . Food insecurity    Worry: Not on file    Inability: Not on file  . Transportation needs    Medical: Not on file    Non-medical: Not on file  Tobacco Use  . Smoking status: Former Smoker    Packs/day: 0.25    Years: 40.00    Pack years: 10.00    Types: Cigarettes  . Smokeless tobacco: Never Used  Substance and Sexual Activity  . Alcohol use: No  . Drug use: No  . Sexual activity: Not on file  Lifestyle  . Physical activity    Days per week: Not on file    Minutes per session: Not on file  . Stress: Not on file  Relationships  . Social Herbalist on phone: Not on file    Gets together: Not on file    Attends religious service:  Not on file    Active member of club or organization: Not on file    Attends meetings of clubs or organizations: Not on file    Relationship status: Not on file  Other Topics Concern  . Not on file  Social History Narrative  . Not on file     Family History: The patient's family history includes Breast cancer in her mother; COPD in her father; Colon polyps in her mother; Diabetes in her father; Heart attack in her father; Heart disease in her father; Hypertension in her father; Kidney disease in her father. There is no history of Colon cancer or Esophageal cancer. ROS:   Please see the history of present illness.    All 14 point review of systems negative except as described per history of present illness  EKGs/Labs/Other Studies Reviewed:      Recent Labs: 12/05/2018: BUN 8; Creatinine, Ser 0.71; Potassium 4.3; Sodium 144  Recent Lipid Panel    Component Value Date/Time   CHOL 138 10/30/2017 0227   TRIG 25 10/30/2017 0227   HDL 69 10/30/2017 0227   CHOLHDL 2.0 10/30/2017 0227   VLDL 5 10/30/2017 0227   LDLCALC 64 10/30/2017 0227    Physical Exam:    VS:  BP 122/68   Pulse 68   Ht 5\' 4"  (1.626 m)   Wt 136 lb 6.4 oz (61.9 kg)   SpO2 97%   BMI 23.41 kg/m     Wt Readings from Last 3 Encounters:  02/19/19 136 lb 6.4 oz (61.9 kg)  12/13/18 138 lb 3.2 oz (62.7 kg)  11/20/18 135 lb (61.2 kg)     GEN:  Well nourished, well developed in no acute distress HEENT: Normal NECK: No JVD; No carotid bruits LYMPHATICS: No lymphadenopathy CARDIAC: RRR, no murmurs, no rubs, no gallops RESPIRATORY:  Clear to auscultation without rales, wheezing or rhonchi  ABDOMEN: Soft, non-tender, non-distended MUSCULOSKELETAL:  No edema; No deformity  SKIN: Warm and dry LOWER EXTREMITIES: no swelling NEUROLOGIC:  Alert and oriented x 3 PSYCHIATRIC:  Normal affect   ASSESSMENT:    1. Takotsubo cardiomyopathy   2. Coronary artery disease involving native coronary artery of native heart  without angina pectoris   3. Dyspnea on exertion  4. Atypical chest pain    PLAN:    In order of problems listed above:  1. Takotsubo cardiomyopathy latest ejection fraction 4045%.  Plan is to initiate Entresto 2426 Chem-7 will be checked today as well as few days later.  In terms of beta-blocker I could not do it because she reports to have slow heart rate sometimes. 2. 2.  Coronary artery disease 50% LAD recent stress test negative.  Continue present medications. 3. Dyspnea on exertion multifactorial the key is to put her on right medications. 4. Atypical chest pain denies having any   Medication Adjustments/Labs and Tests Ordered: Current medicines are reviewed at length with the patient today.  Concerns regarding medicines are outlined above.  No orders of the defined types were placed in this encounter.  Medication changes: No orders of the defined types were placed in this encounter.   Signed, Park Liter, MD, [facility] 02/19/2019 1:51 PM    [place]

## 2019-02-20 LAB — BASIC METABOLIC PANEL
BUN/Creatinine Ratio: 14 (ref 12–28)
BUN: 12 mg/dL (ref 8–27)
CO2: 25 mmol/L (ref 20–29)
Calcium: 9.5 mg/dL (ref 8.7–10.3)
Chloride: 106 mmol/L (ref 96–106)
Creatinine, Ser: 0.87 mg/dL (ref 0.57–1.00)
GFR calc Af Amer: 82 mL/min/{1.73_m2} (ref >59)
GFR calc non Af Amer: 71 mL/min/{1.73_m2} (ref >59)
Glucose: 100 mg/dL — ABNORMAL HIGH (ref 65–99)
Potassium: 4.8 mmol/L (ref 3.5–5.2)
Sodium: 145 mmol/L — ABNORMAL HIGH (ref 134–144)

## 2019-03-05 ENCOUNTER — Telehealth: Payer: Self-pay | Admitting: Emergency Medicine

## 2019-03-05 NOTE — Telephone Encounter (Signed)
Patient called in. She reports her pharmacy said they are waiting on prior auth for her entresto. Informed her we just found out that they have denied it because she isn't on a beta blocker. Will discuss with Dr. Agustin Cree if he would like to appeal. Patient has been off of losartan and out of it for almost two weeks and reports her blood pressures have been high, the highest 160/101 and others 146/85, 148/98 and while on the phone currently her bp is 157/99. She has been having a headache as well. Encouraged patient to get her 30 day supply from pharmacy, the card was given to her at last appointment. She reports she will, I also have samples for her to pick up from [place] office and a patient assistance form in case we can't get her insurance to approve entresto. In the meant time she will get samples and use 30 day free supply. Will consult with Dr. Agustin Cree if he has any further recommendations at this time. Patient was asked to monitor blood pressure and let us know if it doesn't get better when she starts entresto. She verbally understood, no further questions.

## 2019-03-05 NOTE — Telephone Encounter (Signed)
Reason why she is not on beta-blockers the fact that she is bradycardic and has been documented in the chart.  I will push towards getting Kathryn [name] he is she will not get Entresto then lets go with losartan 25 twice daily.  Kathryn [name] will be better

## 2019-03-06 ENCOUNTER — Other Ambulatory Visit: Payer: Self-pay | Admitting: Cardiology

## 2019-03-06 NOTE — Telephone Encounter (Signed)
Appeal sent to plan.

## 2019-03-22 ENCOUNTER — Ambulatory Visit: Payer: 59 | Admitting: Cardiology

## 2019-03-28 ENCOUNTER — Encounter: Payer: Self-pay | Admitting: Cardiology

## 2019-03-28 ENCOUNTER — Other Ambulatory Visit: Payer: Self-pay

## 2019-03-28 ENCOUNTER — Ambulatory Visit (INDEPENDENT_AMBULATORY_CARE_PROVIDER_SITE_OTHER): Payer: 59 | Admitting: Cardiology

## 2019-03-28 VITALS — BP 100/68 | HR 72 | Ht 64.0 in | Wt 138.0 lb

## 2019-03-28 DIAGNOSIS — R0600 Dyspnea, unspecified: Secondary | ICD-10-CM

## 2019-03-28 DIAGNOSIS — R0789 Other chest pain: Secondary | ICD-10-CM | POA: Diagnosis not present

## 2019-03-28 DIAGNOSIS — I5181 Takotsubo syndrome: Secondary | ICD-10-CM

## 2019-03-28 DIAGNOSIS — I2510 Atherosclerotic heart disease of native coronary artery without angina pectoris: Secondary | ICD-10-CM

## 2019-03-28 DIAGNOSIS — R0609 Other forms of dyspnea: Secondary | ICD-10-CM

## 2019-03-28 LAB — BASIC METABOLIC PANEL
BUN/Creatinine Ratio: 18 (ref 12–28)
BUN: 14 mg/dL (ref 8–27)
CO2: 24 mmol/L (ref 20–29)
Calcium: 9.3 mg/dL (ref 8.7–10.3)
Chloride: 105 mmol/L (ref 96–106)
Creatinine, Ser: 0.78 mg/dL (ref 0.57–1.00)
GFR calc Af Amer: 94 mL/min/{1.73_m2} (ref >59)
GFR calc non Af Amer: 81 mL/min/{1.73_m2} (ref >59)
Glucose: 99 mg/dL (ref 65–99)
Potassium: 5 mmol/L (ref 3.5–5.2)
Sodium: 139 mmol/L (ref 134–144)

## 2019-03-28 NOTE — Addendum Note (Signed)
Addended by: Ashok Norris on: 03/28/2019 08:40 AM   Modules accepted: Orders

## 2019-03-28 NOTE — Patient Instructions (Signed)
Medication Instructions:  Your physician recommends that you continue on your current medications as directed. Please refer to the Current Medication list given to you today.  *If you need a refill on your cardiac medications before your next appointment, please call your pharmacy*  Lab Work: Your physician recommends that you return for lab work today: bmp   If you have labs (blood work) drawn today and your tests are completely normal, you will receive your results only by: Marland Kitchen MyChart Message (if you have MyChart) OR . A paper copy in the mail If you have any lab test that is abnormal or we need to change your treatment, we will call you to review the results.  Testing/Procedures: None.   Follow-Up: At [facility], you and your health needs are our priority.  As part of our continuing mission to provide you with exceptional heart care, we have created designated Provider Care Teams.  These Care Teams include your primary Cardiologist (physician) and Advanced Practice Providers (APPs -  Physician Assistants and Nurse Practitioners) who all work together to provide you with the care you need, when you need it.  Your next appointment:   3 month(s)  The format for your next appointment:   In Person  Provider:   Jenne Campus, MD  Other Instructions

## 2019-03-28 NOTE — Progress Notes (Signed)
Cardiology Office Note:    Date:  03/28/2019   ID:  Kathryn [name], DOB November 09, 1955, MRN [id number]  PCP:  Ernestene Kiel, MD  Cardiologist:  Jenne Campus, MD    Referring MD: Ernestene Kiel, MD   Chief Complaint  Patient presents with  . Follow-up    1 Month    History of Present Illness:    Kathryn [name] is a 64 y.o. female  with cardiomyopathy history of Takotsubo, cardiac catheterization at that time showed 50% LAD, recent stress test showed no evidence of ischemia.  Her ejection fraction 40 to 45%, and try to put her on right medications.  There was an issue of bradycardia.  She is not doing well today.  She complained of having pleuritic pain in the left lower chest.  She said she have to correct the situation like this twice in her life every single time this sensation would last for few days and then it gets better.  She does not have any shortness of breath there is no swelling of lower extremities there is no pain in her calves.  I offer her some testing including CT or chest x-ray but she declined she says she prefers to wait always this sensation goes away and she hoped that is what will happen.  Otherwise she is doing better her blood pressure is better she is happy with Entresto.  The problem is that insurance company does not want to pay for [employer].  We will try to investigate what the issue is.  Past Medical History:  Diagnosis Date  . Anxiety   . Arthritis   . Heart attack ([place]) 10/2017  . Kidney stone   . Pneumonia   . Skin cancer (melanoma) ([place])   . Stress incontinence   . Tobacco use   . UTI (urinary tract infection)     Past Surgical History:  Procedure Laterality Date  . ABDOMINAL HYSTERECTOMY     cervical cancer  . back skin flap    . BACK SURGERY     lumbar and cervical  . hernia surgery     X2   . LEFT HEART CATH AND CORONARY ANGIOGRAPHY N/A 10/30/2017   Procedure: LEFT HEART CATH AND CORONARY ANGIOGRAPHY;  Surgeon: Lorretta Harp, MD;  Location: [facility];  Service: Cardiovascular;  Laterality: N/A;  . MANDIBLE SURGERY     lower jaw  . MELANOMA EXCISION     L arm and L leg, 2014  . muscle flap surgery    . NOSE SURGERY  2012   Lost her nose due to cancer  . SKIN CANCER EXCISION     squamous cell cancer , 2012  . WRIST SURGERY      Current Medications: No outpatient medications have been marked as taking for the 03/28/19 encounter (Office Visit) with Park Liter, MD.     Allergies:   Cephalosporins   Social History   Socioeconomic History  . Marital status: Married    Spouse name: Not on file  . Number of children: 2  . Years of education: Not on file  . Highest education level: Not on file  Occupational History  . Not on file  Tobacco Use  . Smoking status: Former Smoker    Packs/day: 0.25    Years: 40.00    Pack years: 10.00    Types: Cigarettes  . Smokeless tobacco: Never Used  Substance and Sexual Activity  . Alcohol use: No  . Drug  use: No  . Sexual activity: Not on file  Other Topics Concern  . Not on file  Social History Narrative  . Not on file   Social Determinants of Health   Financial Resource Strain:   . Difficulty of Paying Living Expenses: Not on file  Food Insecurity:   . Worried About Charity fundraiser in the Last Year: Not on file  . Ran Out of Food in the Last Year: Not on file  Transportation Needs:   . Lack of Transportation (Medical): Not on file  . Lack of Transportation (Non-Medical): Not on file  Physical Activity:   . Days of Exercise per Week: Not on file  . Minutes of Exercise per Session: Not on file  Stress:   . Feeling of Stress : Not on file  Social Connections:   . Frequency of Communication with Friends and Family: Not on file  . Frequency of Social Gatherings with Friends and Family: Not on file  . Attends Religious Services: Not on file  . Active Member of Clubs or Organizations: Not on file  . Attends Archivist  Meetings: Not on file  . Marital Status: Not on file     Family History: The patient's family history includes Breast cancer in her mother; COPD in her father; Colon polyps in her mother; Diabetes in her father; Heart attack in her father; Heart disease in her father; Hypertension in her father; Kidney disease in her father. There is no history of Colon cancer or Esophageal cancer. ROS:   Please see the history of present illness.    All 14 point review of systems negative except as described per history of present illness  EKGs/Labs/Other Studies Reviewed:      Recent Labs: 02/19/2019: BUN 12; Creatinine, Ser 0.87; Potassium 4.8; Sodium 145  Recent Lipid Panel    Component Value Date/Time   CHOL 138 10/30/2017 0227   TRIG 25 10/30/2017 0227   HDL 69 10/30/2017 0227   CHOLHDL 2.0 10/30/2017 0227   VLDL 5 10/30/2017 0227   LDLCALC 64 10/30/2017 0227    Physical Exam:    VS:  Ht 5\' 4"  (1.626 m)   Wt 138 lb (62.6 kg)   BMI 23.69 kg/m     Wt Readings from Last 3 Encounters:  03/28/19 138 lb (62.6 kg)  02/19/19 136 lb 6.4 oz (61.9 kg)  12/13/18 138 lb 3.2 oz (62.7 kg)     GEN:  Well nourished, well developed in no acute distress HEENT: Normal NECK: No JVD; No carotid bruits LYMPHATICS: No lymphadenopathy CARDIAC: RRR, no murmurs, no rubs, no gallops RESPIRATORY:  Clear to auscultation without rales, wheezing or rhonchi  ABDOMEN: Soft, non-tender, non-distended MUSCULOSKELETAL:  No edema; No deformity  SKIN: Warm and dry LOWER EXTREMITIES: no swelling NEUROLOGIC:  Alert and oriented x 3 PSYCHIATRIC:  Normal affect   ASSESSMENT:    1. Takotsubo cardiomyopathy   2. Coronary artery disease involving native coronary artery of native heart without angina pectoris   3. Dyspnea on exertion   4. Atypical chest pain    PLAN:    In order of problems listed above:  1. History of Takotsubo cardiomyopathy latest ejection fraction 4045%, New York Heart Association class II,  and 20 put on appropriate medications.  There was an issue with bradycardia she apparently wear monitor and she is supposed to send it to ask however she lost the monitor she find it send it back and actually yesterday they received  a monitor so expect to get results of this for the next few days.  She denies having any dizziness or passing out. 2. Coronary artery disease.  Stable on appropriate medications which I will continue.  Recent stress test negative. 3. Dyspnea on exertion denies having any 4. Atypical pleuritic chest pain.  I told her if pain does not go away within the next 2 days she did will let us know.  She declined my offer to have chest x-ray done today.   Medication Adjustments/Labs and Tests Ordered: Current medicines are reviewed at length with the patient today.  Concerns regarding medicines are outlined above.  No orders of the defined types were placed in this encounter.  Medication changes: No orders of the defined types were placed in this encounter.   Signed, Park Liter, MD, [facility] 03/28/2019 8:18 AM    [place]

## 2019-03-29 ENCOUNTER — Telehealth: Payer: Self-pay

## 2019-03-29 NOTE — Telephone Encounter (Signed)
The patient has been notified of the lab results and verbalized understanding.  All questions (if any) were answered. Frederik Schmidt, RN 03/29/2019 11:20 AM

## 2019-03-29 NOTE — Telephone Encounter (Signed)
-----   Message from Park Liter, MD sent at 03/28/2019  7:19 PM EST ----- 7 looks good, continue present management

## 2019-06-13 ENCOUNTER — Other Ambulatory Visit: Payer: Self-pay | Admitting: Cardiology

## 2019-06-26 ENCOUNTER — Ambulatory Visit (INDEPENDENT_AMBULATORY_CARE_PROVIDER_SITE_OTHER): Payer: 59 | Admitting: Cardiology

## 2019-06-26 ENCOUNTER — Encounter: Payer: Self-pay | Admitting: Cardiology

## 2019-06-26 ENCOUNTER — Other Ambulatory Visit: Payer: Self-pay

## 2019-06-26 VITALS — BP 130/88 | HR 77 | Ht 64.0 in | Wt 125.2 lb

## 2019-06-26 DIAGNOSIS — R0600 Dyspnea, unspecified: Secondary | ICD-10-CM | POA: Diagnosis not present

## 2019-06-26 DIAGNOSIS — I5181 Takotsubo syndrome: Secondary | ICD-10-CM | POA: Diagnosis not present

## 2019-06-26 DIAGNOSIS — I2510 Atherosclerotic heart disease of native coronary artery without angina pectoris: Secondary | ICD-10-CM | POA: Diagnosis not present

## 2019-06-26 DIAGNOSIS — R0789 Other chest pain: Secondary | ICD-10-CM | POA: Diagnosis not present

## 2019-06-26 DIAGNOSIS — R0609 Other forms of dyspnea: Secondary | ICD-10-CM

## 2019-06-26 MED ORDER — LOSARTAN POTASSIUM 25 MG PO TABS: 25.0000 mg | ORAL_TABLET | Freq: Two times a day (BID) | ORAL | 1 refills | Status: DC

## 2019-06-26 NOTE — Progress Notes (Signed)
Cardiology Office Note:    Date:  06/26/2019   ID:  Kathryn [name], DOB 1955/03/21, MRN [id number]  PCP:  Ernestene Kiel, MD  Cardiologist:  Jenne Campus, MD    Referring MD: Ernestene Kiel, MD   No chief complaint on file. , Short of breath  History of Present Illness:    Kathryn [name] is a 64 y.o. female with past medical history significant for nonischemic cardiomyopathy, ejection fraction 4045%, also cardiac catheterization revealing 50% LAD stenosis, stress test done today did not show any ischemia.  She did have COVID-19 infection she she had difficult time to shaking off the problem.  Still complaining of having some shortness of breath.  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  I put him on Entresto she feels significantly better however still not able to afford the medication.  Therefore, I will put her on losartan 25 twice daily, Chem-7 proBNP will be checked today.  We will work on getting some financial help for [employer].  I told her also if shortness of breath will continue and proBNP will be normal she may consider visit pulmonologist.  Past Medical History:  Diagnosis Date  . Anxiety   . Arthritis   . Heart attack ([place]) 10/2017  . Kidney stone   . Pneumonia   . Skin cancer (melanoma) ([place])   . Stress incontinence   . Tobacco use   . UTI (urinary tract infection)     Past Surgical History:  Procedure Laterality Date  . ABDOMINAL HYSTERECTOMY     cervical cancer  . back skin flap    . BACK SURGERY     lumbar and cervical  . hernia surgery     X2   . LEFT HEART CATH AND CORONARY ANGIOGRAPHY N/A 10/30/2017   Procedure: LEFT HEART CATH AND CORONARY ANGIOGRAPHY;  Surgeon: Lorretta Harp, MD;  Location: [facility];  Service: Cardiovascular;  Laterality: N/A;  . MANDIBLE SURGERY     lower jaw  . MELANOMA EXCISION     L arm and L leg, 2014  . muscle flap surgery    . NOSE SURGERY  2012   Lost  her nose due to cancer  . SKIN CANCER EXCISION     squamous cell cancer , 2012  . WRIST SURGERY      Current Medications: No outpatient medications have been marked as taking for the 06/26/19 encounter (Appointment) with Park Liter, MD.     Allergies:   Cephalosporins   Social History   Socioeconomic History  . Marital status: Married    Spouse name: Not on file  . Number of children: 2  . Years of education: Not on file  . Highest education level: Not on file  Occupational History  . Not on file  Tobacco Use  . Smoking status: Former Smoker    Packs/day: 0.25    Years: 40.00    Pack years: 10.00    Types: Cigarettes  . Smokeless tobacco: Never Used  Substance and Sexual Activity  . Alcohol use: No  . Drug use: No  . Sexual activity: Not on file  Other Topics Concern  . Not on file  Social History Narrative  . Not on file   Social Determinants of Health   Financial Resource Strain:   . Difficulty of Paying Living Expenses:   Food Insecurity:   . Worried About Charity fundraiser in the Last  Year:   . Ran Out of Food in the Last Year:   Transportation Needs:   . Film/video editor (Medical):   Marland Kitchen Lack of Transportation (Non-Medical):   Physical Activity:   . Days of Exercise per Week:   . Minutes of Exercise per Session:   Stress:   . Feeling of Stress :   Social Connections:   . Frequency of Communication with Friends and Family:   . Frequency of Social Gatherings with Friends and Family:   . Attends Religious Services:   . Active Member of Clubs or Organizations:   . Attends Archivist Meetings:   Marland Kitchen Marital Status:      Family History: The patient's family history includes Breast cancer in her mother; COPD in her father; Colon polyps in her mother; Diabetes in her father; Heart attack in her father; Heart disease in her father; Hypertension in her father; Kidney disease in her father. There is no history of Colon cancer or  Esophageal cancer. ROS:   Please see the history of present illness.    All 14 point review of systems negative except as described per history of present illness  EKGs/Labs/Other Studies Reviewed:      Recent Labs: 03/28/2019: BUN 14; Creatinine, Ser 0.78; Potassium 5.0; Sodium 139  Recent Lipid Panel    Component Value Date/Time   CHOL 138 10/30/2017 0227   TRIG 25 10/30/2017 0227   HDL 69 10/30/2017 0227   CHOLHDL 2.0 10/30/2017 0227   VLDL 5 10/30/2017 0227   LDLCALC 64 10/30/2017 0227    Physical Exam:    VS:  There were no vitals taken for this visit.    Wt Readings from Last 3 Encounters:  03/28/19 138 lb (62.6 kg)  02/19/19 136 lb 6.4 oz (61.9 kg)  12/13/18 138 lb 3.2 oz (62.7 kg)     GEN:  Well nourished, well developed in no acute distress HEENT: Normal NECK: No JVD; No carotid bruits LYMPHATICS: No lymphadenopathy CARDIAC: RRR, no murmurs, no rubs, no gallops RESPIRATORY:  Clear to auscultation without rales, wheezing or rhonchi  ABDOMEN: Soft, non-tender, non-distended MUSCULOSKELETAL:  No edema; No deformity  SKIN: Warm and dry LOWER EXTREMITIES: no swelling NEUROLOGIC:  Alert and oriented x 3 PSYCHIATRIC:  Normal affect   ASSESSMENT:    1. Coronary artery disease involving native coronary artery of native heart without angina pectoris   2. Takotsubo cardiomyopathy   3. Atypical chest pain   4. Dyspnea on exertion    PLAN:    In order of problems listed above:  1. Coronary disease 50% LAD.  Stress test done in September 2020 showed no evidence of ischemia.  We will continue risk factors modifications. 2. History of nonischemic cardiomyopathy ejection fraction 40 to 45%, unable to afford Entresto, will put temporarily on losartan and will work on financial help for [employer].  Unable to give beta-blocker because of bradycardia she tells me many times when she check her pulse her pulse is 42.  No dizziness no passing out. 3. Atypical chest pain last  time I saw her she was complaining of having pleuritic chest pain I wanted her to have CT or chest x-ray she refused she said she got those pains from time to time always goes late by itself.  Dyspnea on exertion obviously concerning.  She thinks this is a sequela of COVID-19 infection which could be the case.  I will do proBNP 7 8 that did not explain the problem list.  She may need to see pulmonologist. 4.  Today we will do Chem-7 as well as proBNP   Medication Adjustments/Labs and Tests Ordered: Current medicines are reviewed at length with the patient today.  Concerns regarding medicines are outlined above.  No orders of the defined types were placed in this encounter.  Medication changes: No orders of the defined types were placed in this encounter.   Signed, Park Liter, MD, [facility] 06/26/2019 10:56 AM    [place]

## 2019-06-26 NOTE — Patient Instructions (Signed)
Medication Instructions:  Your physician has recommended you make the following change in your medication:   START: Losartan 25 mg twice daily   *If you need a refill on your cardiac medications before your next appointment, please call your pharmacy*   Lab Work: Your physician recommends that you return for lab work today: bmp, pro bnp   If you have labs (blood work) drawn today and your tests are completely normal, you will receive your results only by: Marland Kitchen MyChart Message (if you have MyChart) OR . A paper copy in the mail If you have any lab test that is abnormal or we need to change your treatment, we will call you to review the results.   Testing/Procedures: None.    Follow-Up: At [facility], you and your health needs are our priority.  As part of our continuing mission to provide you with exceptional heart care, we have created designated Provider Care Teams.  These Care Teams include your primary Cardiologist (physician) and Advanced Practice Providers (APPs -  Physician Assistants and Nurse Practitioners) who all work together to provide you with the care you need, when you need it.  We recommend signing up for the patient portal called "MyChart".  Sign up information is provided on this After Visit Summary.  MyChart is used to connect with patients for Virtual Visits (Telemedicine).  Patients are able to view lab/test results, encounter notes, upcoming appointments, etc.  Non-urgent messages can be sent to your provider as well.   To learn more about what you can do with MyChart, go to [email or web address].    Your next appointment:   3 month(s)  The format for your next appointment:   In Person  Provider:   Jenne Campus, MD   Other Instructions  Losartan Tablets What is this medicine? LOSARTAN (loe SAR tan) is an angiotensin II receptor blocker, also known as an ARB. It treats high blood pressure. It can slow kidney damage in some patients. It may also be  used to lower the risk of stroke. This medicine may be used for other purposes; ask your health care provider or pharmacist if you have questions. COMMON BRAND NAME(S): Cozaar What should I tell my health care provider before I take this medicine? They need to know if you have any of these conditions:  heart failure  kidney or liver disease  an unusual or allergic reaction to losartan, other medicines, foods, dyes, or preservatives  pregnant or trying to get pregnant  breast-feeding How should I use this medicine? Take this drug by mouth. Take it as directed on the prescription label at the same time every day. You can take it with or without food. If it upsets your stomach, take it with food. Keep taking it unless your health care provider tells you to stop. Talk to your health care provider about the use of this drug in children. While it may be prescribed for children as young as 6 for selected conditions, precautions do apply. Overdosage: If you think you have taken too much of this medicine contact a poison control center or emergency room at once. NOTE: This medicine is only for you. Do not share this medicine with others. What if I miss a dose? If you miss a dose, take it as soon as you can. If it is almost time for your next dose, take only that dose. Do not take double or extra doses. What may interact with this medicine?  blood pressure medicines  diuretics, especially  triamterene, spironolactone, or amiloride  fluconazole  NSAIDs, medicines for pain and inflammation, like ibuprofen or naproxen  potassium salts or potassium supplements  rifampin This list may not describe all possible interactions. Give your health care provider a list of all the medicines, herbs, non-prescription drugs, or dietary supplements you use. Also tell them if you smoke, drink alcohol, or use illegal drugs. Some items may interact with your medicine. What should I watch for while using this  medicine? Visit your doctor or health care professional for regular checks on your progress. Check your blood pressure as directed. Ask your doctor or health care professional what your blood pressure should be and when you should contact him or her. Call your doctor or health care professional if you notice an irregular or fast heart beat. Women should inform their doctor if they wish to become pregnant or think they might be pregnant. There is a potential for serious side effects to an unborn child, particularly in the second or third trimester. Talk to your health care professional or pharmacist for more information. You may get drowsy or dizzy. Do not drive, use machinery, or do anything that needs mental alertness until you know how this drug affects you. Do not stand or sit up quickly, especially if you are an older patient. This reduces the risk of dizzy or fainting spells. Alcohol can make you more drowsy and dizzy. Avoid alcoholic drinks. Avoid salt substitutes unless you are told otherwise by your doctor or health care professional. Do not treat yourself for coughs, colds, or pain while you are taking this medicine without asking your doctor or health care professional for advice. Some ingredients may increase your blood pressure. What side effects may I notice from receiving this medicine? Side effects that you should report to your doctor or health care professional as soon as possible:  confusion, dizziness, light headedness or fainting spells  decreased amount of urine passed  difficulty breathing or swallowing, hoarseness, or tightening of the throat  fast or irregular heart beat, palpitations, or chest pain  skin rash, itching  swelling of your face, lips, tongue, hands, or feet Side effects that usually do not require medical attention (report to your doctor or health care professional if they continue or are bothersome):  cough  decreased sexual function or  desire  headache  nasal congestion or stuffiness  nausea or stomach pain  sore or cramping muscles This list may not describe all possible side effects. Call your doctor for medical advice about side effects. You may report side effects to FDA at 1-800-FDA-1088. Where should I keep my medicine? Keep out of the reach of children and pets. Store at room temperature between 15 and 30 degrees C (59 and 86 degrees F). Protect from light. Keep the container tightly closed. Throw away any unused drug after the expiration date. NOTE: This sheet is a summary. It may not cover all possible information. If you have questions about this medicine, talk to your doctor, pharmacist, or health care provider.  2020 Elsevier/Gold Standard (2018-10-03 12:12:28)

## 2019-06-27 ENCOUNTER — Telehealth: Payer: Self-pay

## 2019-06-27 LAB — BASIC METABOLIC PANEL
BUN/Creatinine Ratio: 19 (ref 12–28)
BUN: 14 mg/dL (ref 8–27)
CO2: 27 mmol/L (ref 20–29)
Calcium: 9.6 mg/dL (ref 8.7–10.3)
Chloride: 104 mmol/L (ref 96–106)
Creatinine, Ser: 0.73 mg/dL (ref 0.57–1.00)
GFR calc Af Amer: 101 mL/min/{1.73_m2} (ref >59)
GFR calc non Af Amer: 88 mL/min/{1.73_m2} (ref >59)
Glucose: 101 mg/dL — ABNORMAL HIGH (ref 65–99)
Potassium: 5.2 mmol/L (ref 3.5–5.2)
Sodium: 141 mmol/L (ref 134–144)

## 2019-06-27 LAB — PRO B NATRIURETIC PEPTIDE: NT-Pro BNP: 134 pg/mL (ref 0–287)

## 2019-06-27 NOTE — Telephone Encounter (Signed)
Spoke with patient regarding results and recommendation.  Patient verbalizes understanding and is agreeable to plan of care. Advised patient to call back with any issues or concerns.  

## 2019-06-27 NOTE — Telephone Encounter (Signed)
-----   Message from Park Liter, MD sent at 06/27/2019 12:49 PM EDT ----- Laboratory tests are looking good.  There is no evidence of CHF based on the blood test.  We will continue present management

## 2019-06-27 NOTE — Telephone Encounter (Signed)
Left message on patients voicemail to please return our call.   

## 2019-08-06 ENCOUNTER — Telehealth: Payer: Self-pay | Admitting: Cardiology

## 2019-08-06 DIAGNOSIS — M7989 Other specified soft tissue disorders: Secondary | ICD-10-CM

## 2019-08-06 NOTE — Telephone Encounter (Signed)
Called patient informed her per Dr. Agustin Cree we will order a venous ultrasound. She verbally understood. Once precerted we will get her scheduled.

## 2019-08-06 NOTE — Telephone Encounter (Signed)
Called patient. She reports right leg pain for [redacted] week along with swelling she states that it hurts to even touch it. The pain began in her knee and has moved to her calf. She denies redness or warmth to the area. No shortness of breath no swelling any where else and no weight gain. Patient would like to know what she should do. Will consult with Dr. Agustin Cree.

## 2019-08-06 NOTE — Telephone Encounter (Signed)
   Pt c/o swelling: STAT is pt has developed SOB within 24 hours  1) How much weight have you gained and in what time span? No  2) If swelling, where is the swelling located? Right leg   3) Are you currently taking a fluid pill? No  4) Are you currently SOB? A little bit  5) Do you have a log of your daily weights (if so, list)? no  6) Have you gained 3 pounds in a day or 5 pounds in a week? no  7) Have you traveled recently? No  Pt said, her right leg is swollen and it hurt bad on her calf area. She wasn't sure what to do and would like DR. Krasowski's recommendation

## 2019-08-19 ENCOUNTER — Other Ambulatory Visit: Payer: Self-pay | Admitting: Cardiology

## 2019-08-21 ENCOUNTER — Telehealth: Payer: Self-pay | Admitting: Cardiology

## 2019-08-21 NOTE — Telephone Encounter (Signed)
Called patient informed her per Dr. Geraldo Pitter that her lower extremity ultrasound was not critical and there was no dvt. She will reach out the primary care because her leg is still bothering her. I also informed her I will let Dr. Agustin Cree know as well and he can advise further if he needs to. Patient has no further questions and will call if something changes or gets worse.

## 2019-08-21 NOTE — Telephone Encounter (Signed)
Called patient and she reports she had a lower extremity ultrasound a couple weeks ago and wanted to know the results. I retrieved the report for [place] and will have dod look over it and call patient with results.

## 2019-08-21 NOTE — Telephone Encounter (Signed)
Patient calling to speak with a nurse about a long overdue medical record.

## 2019-08-29 IMAGING — CR DG CHEST 2V
2 series · 2 of 2 positions shown · non-contrast
Comparison: 10/29/2017.  09/19/2016.  10/05/2009.

CLINICAL DATA: Shortness of breath.

EXAM:
CHEST - 2 VIEW

[chest pa]
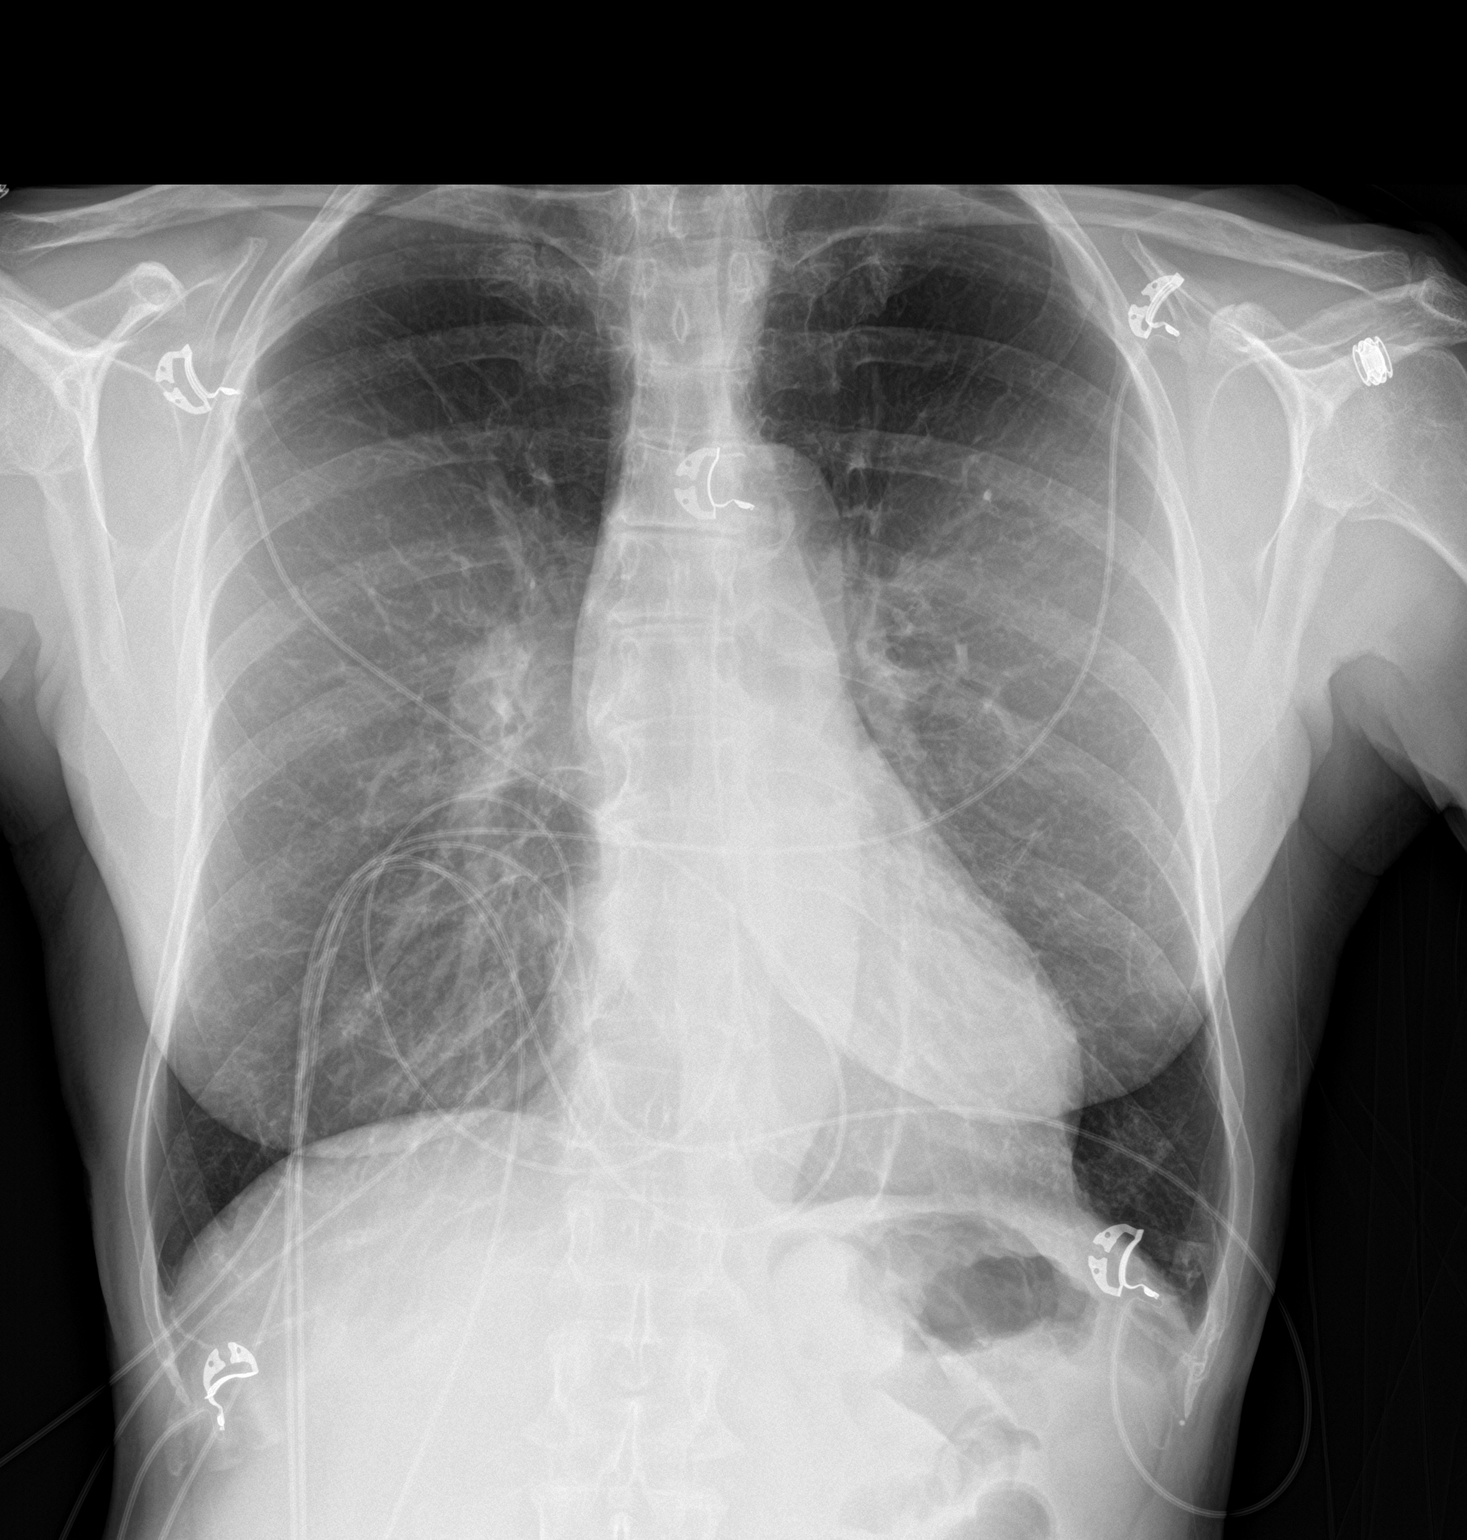

[chest lat]
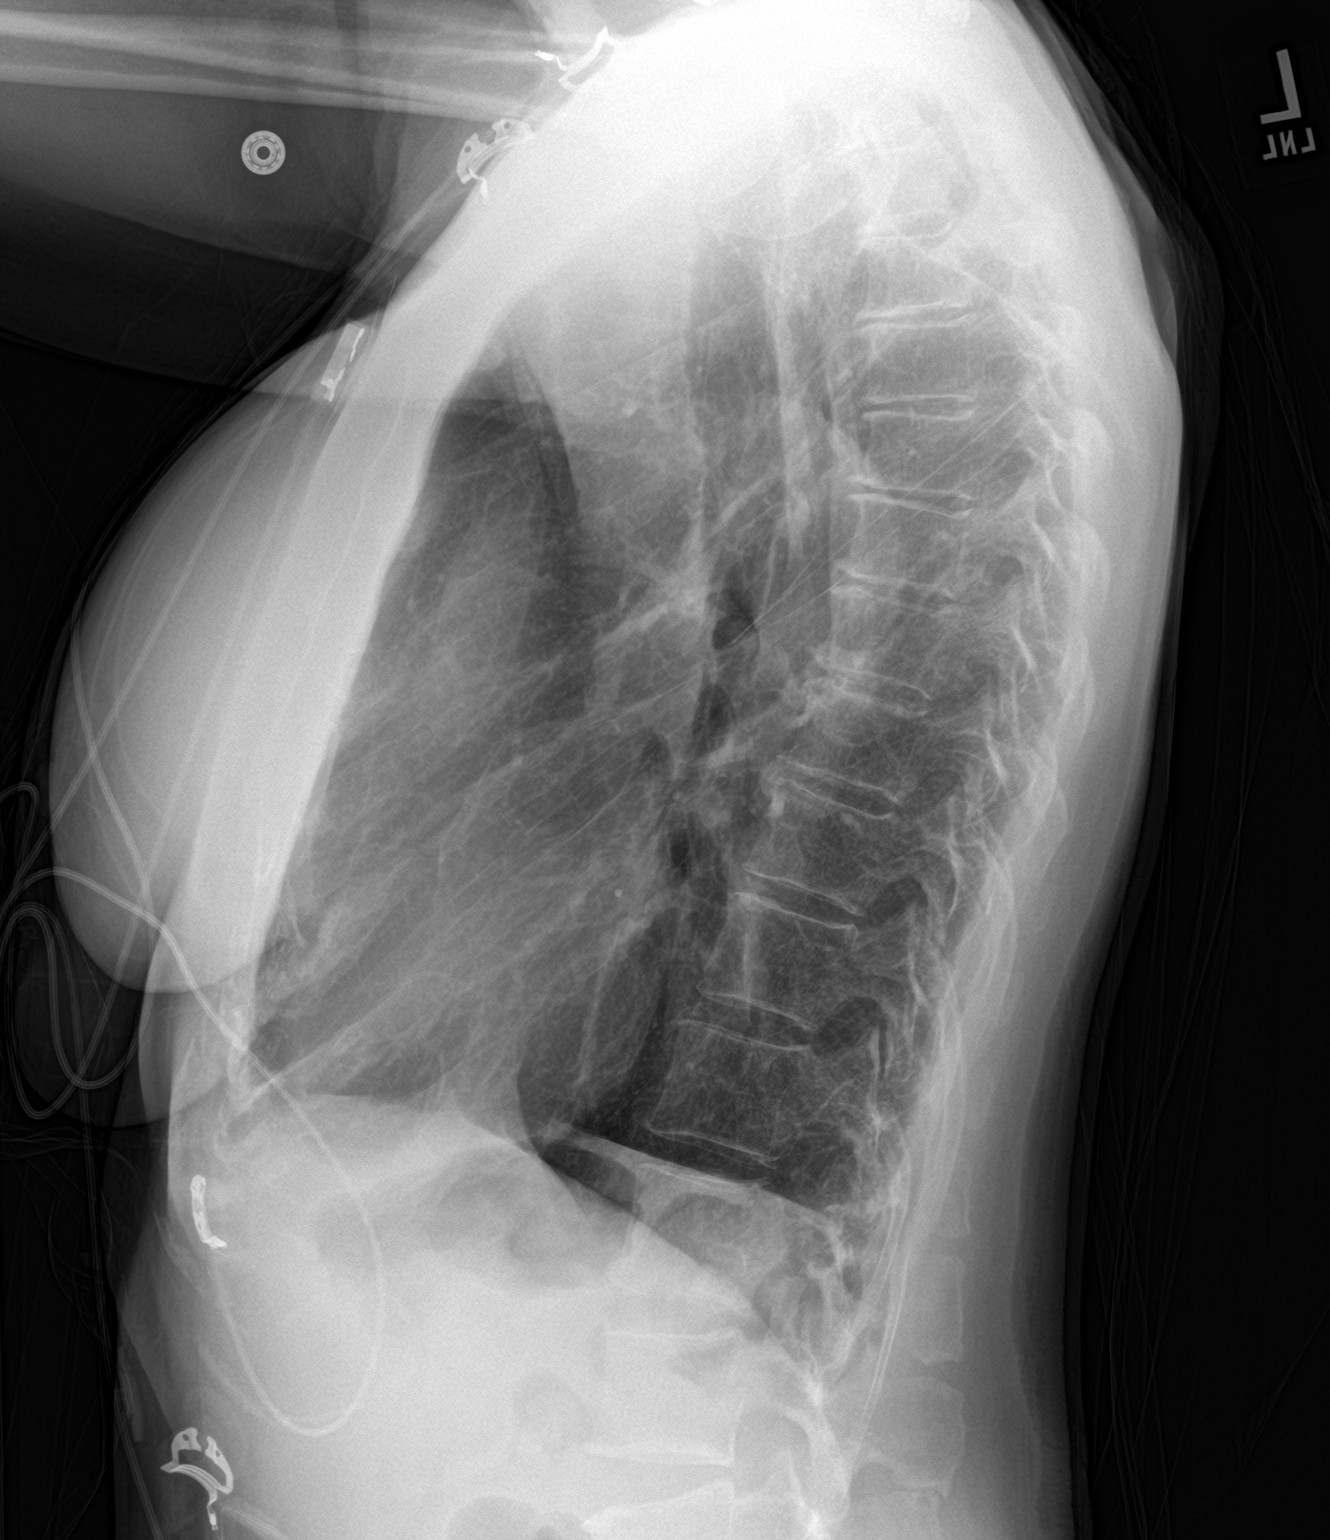

[2 of 2 positions shown; findings below may reference images not displayed]

FINDINGS: Mediastinum and hilar structures stable. Stable right hilar
prominence consistent prominent pulmonary vascularity. Similar
findings noted on prior exams. Heart size stable. No focal alveolar
infiltrate. No pleural effusion or pneumothorax.
IMPRESSION: No acute cardiopulmonary disease.

## 2019-09-21 ENCOUNTER — Other Ambulatory Visit: Payer: Self-pay | Admitting: Cardiology

## 2019-09-30 ENCOUNTER — Other Ambulatory Visit: Payer: Self-pay

## 2019-09-30 ENCOUNTER — Encounter: Payer: Self-pay | Admitting: Cardiology

## 2019-09-30 ENCOUNTER — Ambulatory Visit (INDEPENDENT_AMBULATORY_CARE_PROVIDER_SITE_OTHER): Payer: 59 | Admitting: Cardiology

## 2019-09-30 VITALS — BP 124/68 | HR 68 | Ht 64.0 in | Wt 131.6 lb

## 2019-09-30 DIAGNOSIS — I2510 Atherosclerotic heart disease of native coronary artery without angina pectoris: Secondary | ICD-10-CM | POA: Diagnosis not present

## 2019-09-30 DIAGNOSIS — R0789 Other chest pain: Secondary | ICD-10-CM

## 2019-09-30 DIAGNOSIS — R0600 Dyspnea, unspecified: Secondary | ICD-10-CM

## 2019-09-30 DIAGNOSIS — R0609 Other forms of dyspnea: Secondary | ICD-10-CM

## 2019-09-30 DIAGNOSIS — I5181 Takotsubo syndrome: Secondary | ICD-10-CM | POA: Diagnosis not present

## 2019-09-30 NOTE — Patient Instructions (Signed)
Medication Instructions:  No medication changes. *If you need a refill on your cardiac medications before your next appointment, please call your pharmacy*   Lab Work: None ordered If you have labs (blood work) drawn today and your tests are completely normal, you will receive your results only by: Marland Kitchen MyChart Message (if you have MyChart) OR . A paper copy in the mail If you have any lab test that is abnormal or we need to change your treatment, we will call you to review the results.   Testing/Procedures: Your physician has requested that you have an echocardiogram. Echocardiography is a painless test that uses sound waves to create images of your heart. It provides your doctor with information about the size and shape of your heart and how well your heart's chambers and valves are working. This procedure takes approximately one hour. There are no restrictions for this procedure.    Follow-Up: At [facility], you and your health needs are our priority.  As part of our continuing mission to provide you with exceptional heart care, we have created designated Provider Care Teams.  These Care Teams include your primary Cardiologist (physician) and Advanced Practice Providers (APPs -  Physician Assistants and Nurse Practitioners) who all work together to provide you with the care you need, when you need it.  We recommend signing up for the patient portal called "MyChart".  Sign up information is provided on this After Visit Summary.  MyChart is used to connect with patients for Virtual Visits (Telemedicine).  Patients are able to view lab/test results, encounter notes, upcoming appointments, etc.  Non-urgent messages can be sent to your provider as well.   To learn more about what you can do with MyChart, go to [email or web address].    Your next appointment:   3 month(s)  The format for your next appointment:   In Person  Provider:   Jenne Campus, MD   Other  Instructions  Echocardiogram An echocardiogram is a procedure that uses painless sound waves (ultrasound) to produce an image of the heart. Images from an echocardiogram can provide important information about:  Signs of coronary artery disease (CAD).  Aneurysm detection. An aneurysm is a weak or damaged part of an artery wall that bulges out from the normal force of blood pumping through the body.  Heart size and shape. Changes in the size or shape of the heart can be associated with certain conditions, including heart failure, aneurysm, and CAD.  Heart muscle function.  Heart valve function.  Signs of a past heart attack.  Fluid buildup around the heart.  Thickening of the heart muscle.  A tumor or infectious growth around the heart valves. Tell a health care provider about:  Any allergies you have.  All medicines you are taking, including vitamins, herbs, eye drops, creams, and over-the-counter medicines.  Any blood disorders you have.  Any surgeries you have had.  Any medical conditions you have.  Whether you are pregnant or may be pregnant. What are the risks? Generally, this is a safe procedure. However, problems may occur, including:  Allergic reaction to dye (contrast) that may be used during the procedure. What happens before the procedure? No specific preparation is needed. You may eat and drink normally. What happens during the procedure?   An IV tube may be inserted into one of your veins.  You may receive contrast through this tube. A contrast is an injection that improves the quality of the pictures from your heart.  A gel  will be applied to your chest.  A wand-like tool (transducer) will be moved over your chest. The gel will help to transmit the sound waves from the transducer.  The sound waves will harmlessly bounce off of your heart to allow the heart images to be captured in real-time motion. The images will be recorded on a computer. The  procedure may vary among health care providers and hospitals. What happens after the procedure?  You may return to your normal, everyday life, including diet, activities, and medicines, unless your health care provider tells you not to do that. Summary  An echocardiogram is a procedure that uses painless sound waves (ultrasound) to produce an image of the heart.  Images from an echocardiogram can provide important information about the size and shape of your heart, heart muscle function, heart valve function, and fluid buildup around your heart.  You do not need to do anything to prepare before this procedure. You may eat and drink normally.  After the echocardiogram is completed, you may return to your normal, everyday life, unless your health care provider tells you not to do that. This information is not intended to replace advice given to you by your health care provider. Make sure you discuss any questions you have with your health care provider. Document Revised: 06/21/2018 Document Reviewed: 04/02/2016 Elsevier Patient Education  [place].

## 2019-09-30 NOTE — Progress Notes (Signed)
Cardiology Office Note:    Date:  09/30/2019   ID:  Kathryn [name], DOB May 27, 1955, MRN [id number]  PCP:  Ernestene Kiel, MD  Cardiologist:  Jenne Campus, MD    Referring MD: Ernestene Kiel, MD   No chief complaint on file. I am doing fine  History of Present Illness:    Kathryn [name] is a 64 y.o. female with past medical history significant for nonischemic cardiomyopathy ejection fraction 40 to 45%, cardiac catheterization showing 50% LAD stenosis, recent stress test done showing no evidence of ischemia.  She comes today to my office for follow-up.  Overall she seems to be doing fine.  Still complain of having some fatigue tiredness and shortness of breath.  He got herself lawnmower push and she is trying to cut grass once a week or so usually take her 2 to 3 days to do it complain of having shortness of breath but overall no chest pain tightness squeezing pressure burning chest.  There is some issue with her is having some swelling and pain in the leg.  DVT study was done which was negative, she was find to have Baker's cyst.  Baker's cyst seems to be stabilizing does not give her much more trouble with now she will await with potential intervention on it.  Past Medical History:  Diagnosis Date  . Anxiety   . Arthritis   . Heart attack ([facility]) 10/2017  . Pneumonia   . Skin cancer (melanoma) ([facility])   . Stress incontinence   . Tobacco use   . UTI (urinary tract infection)     Past Surgical History:  Procedure Laterality Date  . ABDOMINAL HYSTERECTOMY     cervical cancer  . back skin flap    . BACK SURGERY     lumbar and cervical  . hernia surgery     X2   . LEFT HEART CATH AND CORONARY ANGIOGRAPHY N/A 10/30/2017   Procedure: LEFT HEART CATH AND CORONARY ANGIOGRAPHY;  Surgeon: Lorretta Harp, MD;  Location: [facility];  Service: Cardiovascular;  Laterality: N/A;  . MANDIBLE SURGERY     lower jaw  . MELANOMA EXCISION     L arm and L leg, 2014  . muscle  flap surgery    . NECK SURGERY    . NOSE SURGERY  2012   Lost her nose due to cancer  . SKIN CANCER EXCISION     squamous cell cancer , 2012  . WRIST SURGERY      Current Medications: Current Meds  Medication Sig  . ALPRAZolam (XANAX) 0.25 MG tablet Take 0.25 mg by mouth at bedtime as needed for anxiety.   Marland Kitchen aspirin EC 81 MG tablet Take 81 mg by mouth daily.  Marland Kitchen atorvastatin (LIPITOR) 80 MG tablet TAKE 1 TABLET BY MOUTH ONCE DAILY AT 6PM  . estrogens-methylTEST 1.25-2.5 MG TABS per tablet Take 1 tablet by mouth daily.  . furosemide (LASIX) 20 MG tablet TAKE 1 TABLET BY MOUTH ONCE DAILY AS NEEDED.  Marland Kitchen losartan (COZAAR) 25 MG tablet TAKE 1 TABLET BY MOUTH TWICE(2) DAILY  . morphine (MS CONTIN) 30 MG 12 hr tablet Take 15 mg by mouth every 8 (eight) hours as needed.   . Nutritional Supplements (JUICE PLUS FIBRE PO) Take 3 capsules by mouth daily.   Marland Kitchen oxybutynin (DITROPAN XL) 15 MG 24 hr tablet 15 mg as needed.   . polyethylene glycol (MIRALAX / GLYCOLAX) packet Take 8.6 g by mouth as needed.   Marland Kitchen  progesterone (PROMETRIUM) 200 MG capsule Take 200 mg by mouth daily.  . ranolazine (RANEXA) 500 MG 12 hr tablet TAKE 1 TABLET BY MOUTH TWICE DAILY.  . traZODone (DESYREL) 150 MG tablet Take 150 mg by mouth at bedtime.   . [DISCONTINUED] sacubitril-valsartan (ENTRESTO) 24-26 MG Take 1 tablet by mouth 2 (two) times daily.     Allergies:   Cephalosporins   Social History   Socioeconomic History  . Marital status: Married    Spouse name: Not on file  . Number of children: 2  . Years of education: Not on file  . Highest education level: Not on file  Occupational History  . Not on file  Tobacco Use  . Smoking status: Current Some Day Smoker    Packs/day: 0.25    Years: 40.00    Pack years: 10.00    Types: Cigarettes  . Smokeless tobacco: Never Used  Vaping Use  . Vaping Use: Never used  Substance and Sexual Activity  . Alcohol use: No  . Drug use: No  . Sexual activity: Not on file    Other Topics Concern  . Not on file  Social History Narrative  . Not on file   Social Determinants of Health   Financial Resource Strain:   . Difficulty of Paying Living Expenses:   Food Insecurity:   . Worried About Charity fundraiser in the Last Year:   . Arboriculturist in the Last Year:   Transportation Needs:   . Film/video editor (Medical):   Marland Kitchen Lack of Transportation (Non-Medical):   Physical Activity:   . Days of Exercise per Week:   . Minutes of Exercise per Session:   Stress:   . Feeling of Stress :   Social Connections:   . Frequency of Communication with Friends and Family:   . Frequency of Social Gatherings with Friends and Family:   . Attends Religious Services:   . Active Member of Clubs or Organizations:   . Attends Archivist Meetings:   Marland Kitchen Marital Status:      Family History: The patient's family history includes Breast cancer in her mother; COPD in her father; Colon polyps in her mother; Diabetes in her father; Heart attack in her father; Heart disease in her father; Hypertension in her father; Kidney disease in her father. There is no history of Colon cancer or Esophageal cancer. ROS:   Please see the history of present illness.    All 14 point review of systems negative except as described per history of present illness  EKGs/Labs/Other Studies Reviewed:      Recent Labs: 06/26/2019: BUN 14; Creatinine, Ser 0.73; NT-Pro BNP 134; Potassium 5.2; Sodium 141  Recent Lipid Panel    Component Value Date/Time   CHOL 138 10/30/2017 0227   TRIG 25 10/30/2017 0227   HDL 69 10/30/2017 0227   CHOLHDL 2.0 10/30/2017 0227   VLDL 5 10/30/2017 0227   LDLCALC 64 10/30/2017 0227    Physical Exam:    VS:  BP 124/68 (BP Location: Right Arm, Patient Position: Sitting, Cuff Size: Normal)   Pulse 68   Ht 5\' 4"  (1.626 m)   Wt 131 lb 9.6 oz (59.7 kg)   SpO2 97%   BMI 22.59 kg/m     Wt Readings from Last 3 Encounters:  09/30/19 131 lb 9.6 oz  (59.7 kg)  06/26/19 125 lb 3.2 oz (56.8 kg)  03/28/19 138 lb (62.6 kg)     GEN:  Well nourished, well developed in no acute distress HEENT: Normal NECK: No JVD; No carotid bruits LYMPHATICS: No lymphadenopathy CARDIAC: RRR, no murmurs, no rubs, no gallops RESPIRATORY:  Clear to auscultation without rales, wheezing or rhonchi  ABDOMEN: Soft, non-tender, non-distended MUSCULOSKELETAL:  No edema; No deformity  SKIN: Warm and dry LOWER EXTREMITIES: no swelling NEUROLOGIC:  Alert and oriented x 3 PSYCHIATRIC:  Normal affect   ASSESSMENT:    1. Takotsubo cardiomyopathy   2. Coronary artery disease involving native coronary artery of native heart without angina pectoris   3. Dyspnea on exertion   4. Atypical chest pain    PLAN:    In order of problems listed above:  1. History of Takotsubo cardiomyopathy with a latest ejection fraction 4045%, she was unable to afford Entresto, therefore he is she is on losartan.  I will ask her to have echocardiogram repeated to recheck left ventricle ejection fraction.  If ejection fraction is the same or worse we need to push towards Entresto. 2. Coronary disease 50% stenosis on cardiac catheterization, a stress test showing no evidence of ischemia. 3. Dyspnea exertion: Still present echocardiogram will be repeated. 4. Atypical chest pain denies having any. 5. Dyslipidemia: I will ask her to have fasting lipid profile done today.   Medication Adjustments/Labs and Tests Ordered: Current medicines are reviewed at length with the patient today.  Concerns regarding medicines are outlined above.  No orders of the defined types were placed in this encounter.  Medication changes: No orders of the defined types were placed in this encounter.   Signed, Park Liter, MD, [facility] 09/30/2019 1:46 PM    [place]

## 2019-10-18 ENCOUNTER — Other Ambulatory Visit: Payer: Self-pay

## 2019-10-18 ENCOUNTER — Ambulatory Visit (INDEPENDENT_AMBULATORY_CARE_PROVIDER_SITE_OTHER): Payer: 59

## 2019-10-18 DIAGNOSIS — I2510 Atherosclerotic heart disease of native coronary artery without angina pectoris: Secondary | ICD-10-CM

## 2019-10-18 DIAGNOSIS — R0600 Dyspnea, unspecified: Secondary | ICD-10-CM | POA: Diagnosis not present

## 2019-10-18 DIAGNOSIS — R0789 Other chest pain: Secondary | ICD-10-CM

## 2019-10-18 DIAGNOSIS — R0609 Other forms of dyspnea: Secondary | ICD-10-CM

## 2019-10-18 LAB — ECHOCARDIOGRAM COMPLETE
Area-P 1/2: 4.15 cm2
S' Lateral: 3.5 cm

## 2019-10-18 NOTE — Progress Notes (Signed)
Complete echocardiogram performed.  Jimmy Antowan Samford RDCS, RVT  

## 2019-10-23 ENCOUNTER — Telehealth: Payer: Self-pay | Admitting: Cardiology

## 2019-10-23 NOTE — Telephone Encounter (Signed)
Left message for patient to call back  

## 2019-10-23 NOTE — Telephone Encounter (Signed)
       [facility] Pre-operative Risk Assessment    HEARTCARE STAFF: - Please ensure there is not already an duplicate clearance open for this procedure. - Under Visit Info/Reason for Call, type in Other and utilize the format Clearance MM/DD/YY or Clearance TBD. Do not use dashes or single digits. - If request is for dental extraction, please clarify the # of teeth to be extracted.  Request for surgical clearance:  1. What type of surgery is being performed? Right carpal tunnel release  2. When is this surgery scheduled? 10/29/2019   3. What type of clearance is required (medical clearance vs. Pharmacy clearance to hold med vs. Both)? medical  4. Are there any medications that need to be held prior to surgery and how long? none  5. Practice name and name of physician performing surgery? Dr. Samule Dry  6. What is the office phone number? [phone] ext. [phone]   7.   What is the office fax number? [phone]  8.   Anesthesia type (None, local, MAC, general) ? Light Sedation and nerve block   Kathryn [name] 10/23/2019, 1:04 PM  _________________________________________________________________   (provider comments below)

## 2019-10-23 NOTE — Telephone Encounter (Signed)
Patient is returning call.  °

## 2019-10-23 NOTE — Telephone Encounter (Signed)
   Primary Cardiologist: Dr Kirkland Hun  Chart reviewed and the patient was contacted by phone today as part of pre-operative protocol coverage. Given past medical history and time since last visit, based on ACC/AHA guidelines, Loetta D Bottcher would be at acceptable risk for the planned procedure without further cardiovascular testing.   Okay to hold aspirin 5 to 7 days preop if needed.  I will route this recommendation to the requesting party via Epic fax function and remove from pre-op pool.  Please call with questions.  Kerin Ransom, PA-C 10/23/2019, 3:52 PM

## 2019-11-01 IMAGING — RF DG ESOPHAGUS
8 series · 22 of 24 positions shown · non-contrast
Comparison: None.

CLINICAL DATA: Dysphagia.  Difficulty swallowing

EXAM:
ESOPHOGRAM / BARIUM SWALLOW / BARIUM TABLET STUDY
TECHNIQUE: Combined double contrast and single contrast examination performed
using effervescent crystals, thick barium liquid, and thin barium
liquid. The patient was observed with fluoroscopy swallowing a 13 mm
barium sulphate tablet.
FLUOROSCOPY TIME:  Fluoroscopy Time:  1 minutes 30 seconds
Radiation Exposure Index (if provided by the fluoroscopic device):
3.9 mGy
Number of Acquired Spot Images: 0

[Series 1: cp_standard · 0.51mm/px · 3 of 58 frames shown (1 of 8)]
[frame 9/58]
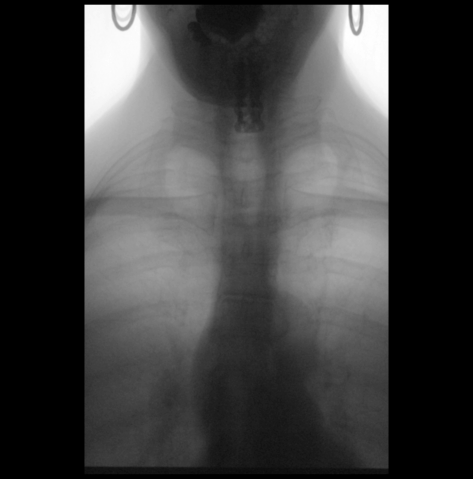
[frame 14/58]
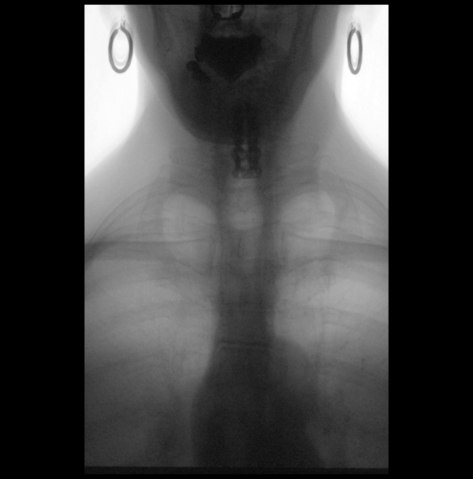
[frame 50/58]
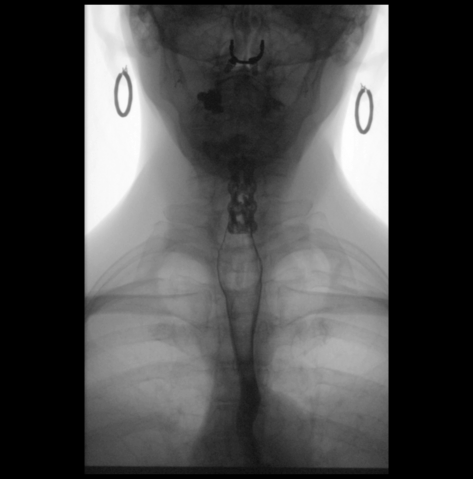

[Series 2: cp_standard · 0.34mm/px · 2 of 40 frames shown (2 of 8)]
[frame 7/40]
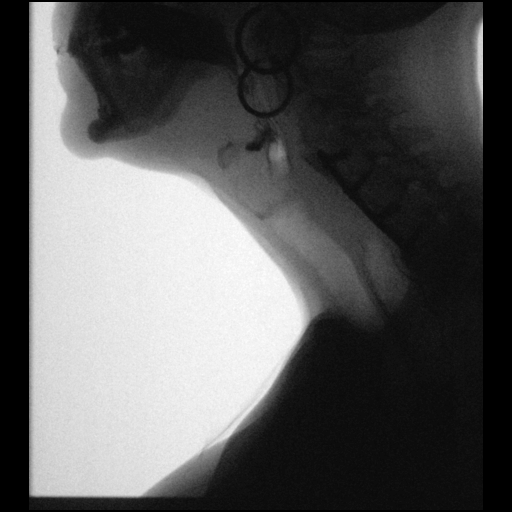
[frame 9/40]
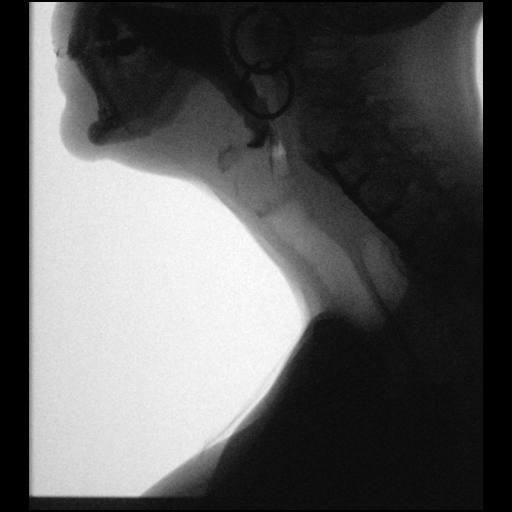

[Series 3: cp_standard · 0.52mm/px · 3 of 26 frames shown (3 of 8)]
[frame 4/26]
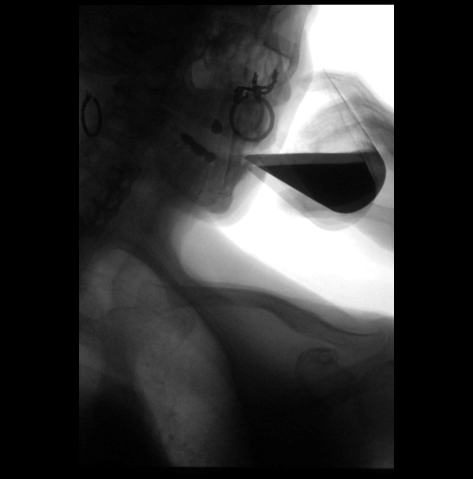
[frame 14/26]
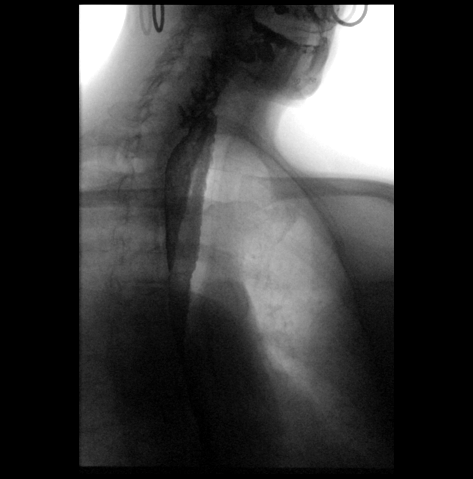
[frame 23/26]
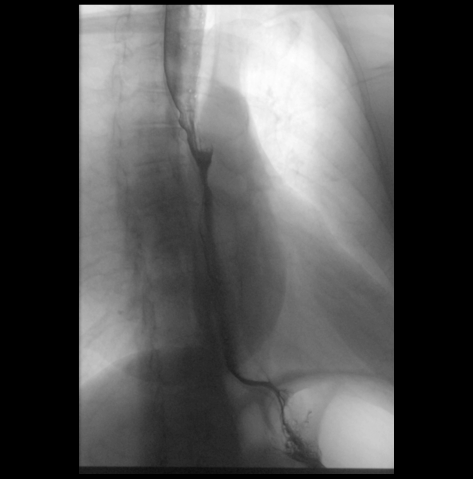

[Series 4: cp_standard · 0.52mm/px · 3 of 8 frames shown (4 of 8)]
[frame 2/8]
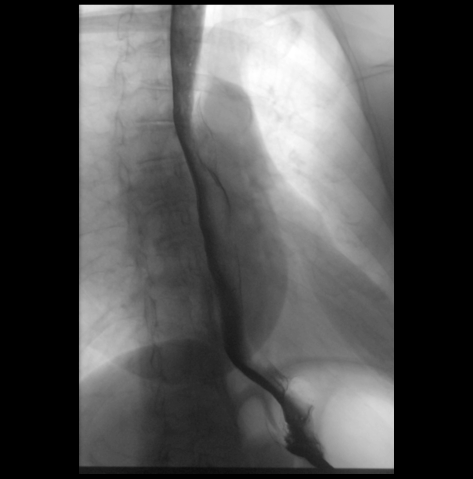
[frame 5/8]
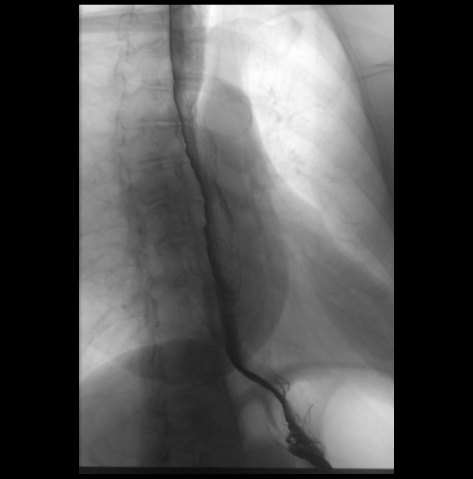
[frame 7/8]
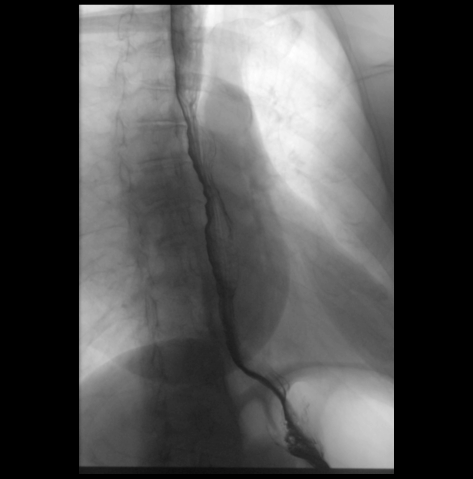

[Series 5: cp_standard · 0.52mm/px · 3 of 28 frames shown (5 of 8)]
[frame 1/28]
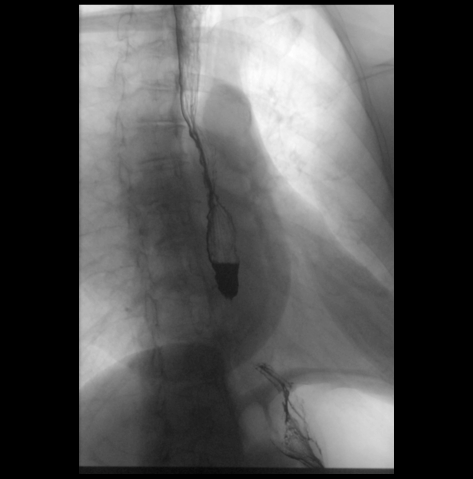
[frame 5/28]
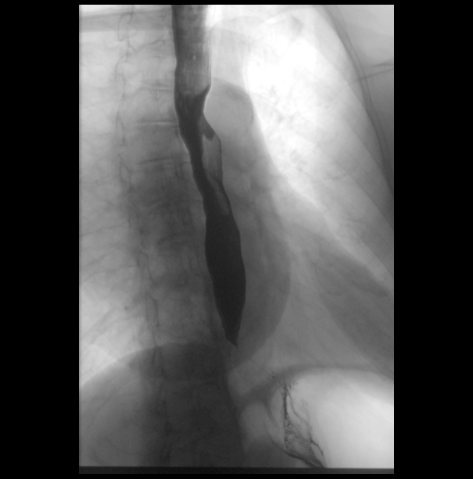
[frame 24/28]
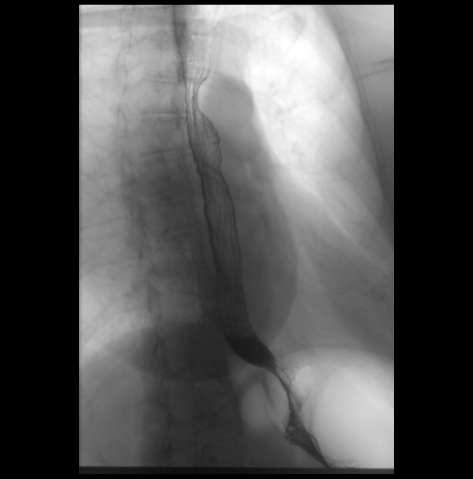

[Series 6: cp_standard · 0.55mm/px · 2 of 35 frames shown (6 of 8)]
[frame 6/35]
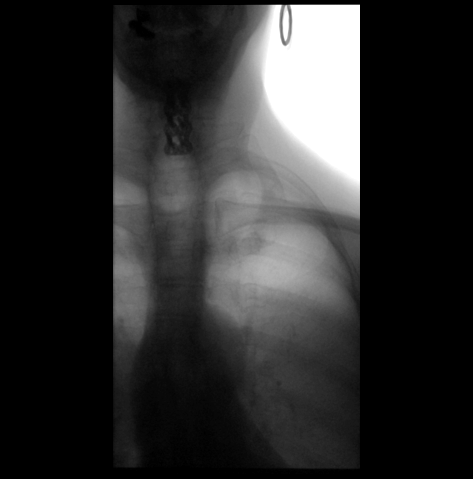
[frame 18/35]
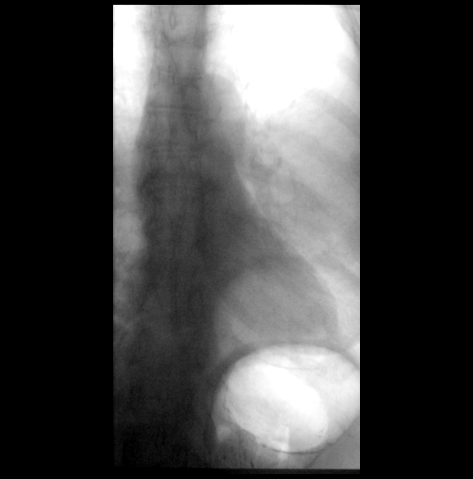

[Series 7: cp_standard · 0.55mm/px · 4 of 34 frames shown (7 of 8)]
[frame 6/34]
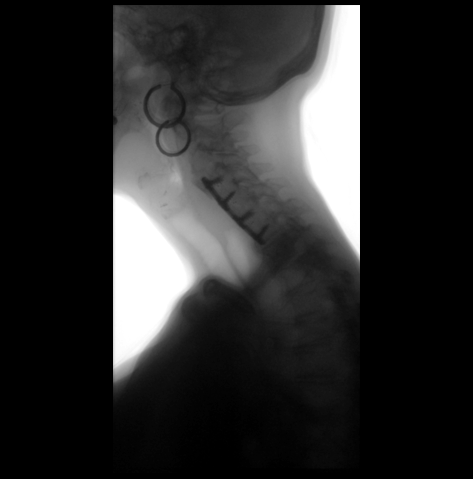
[frame 18/34]
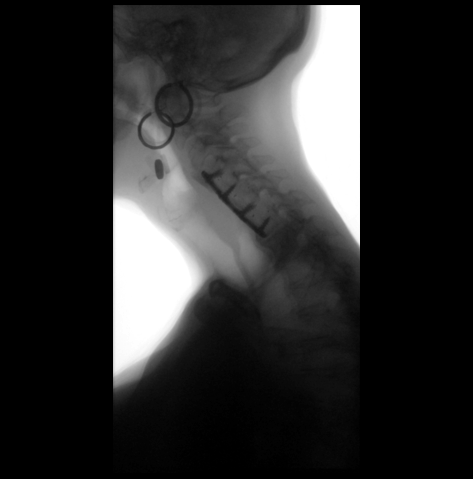
[frame 22/34]
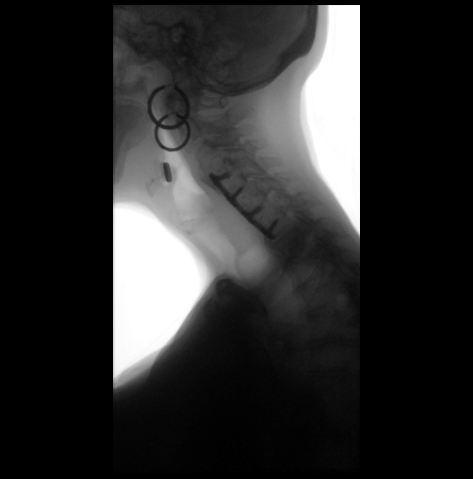
[frame 29/34]
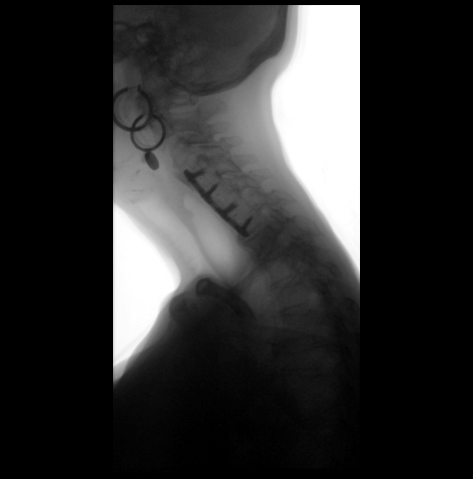

[Series 8: cp_standard · 0.55mm/px · 2 of 7 frames shown (8 of 8)]
[frame 4/7]
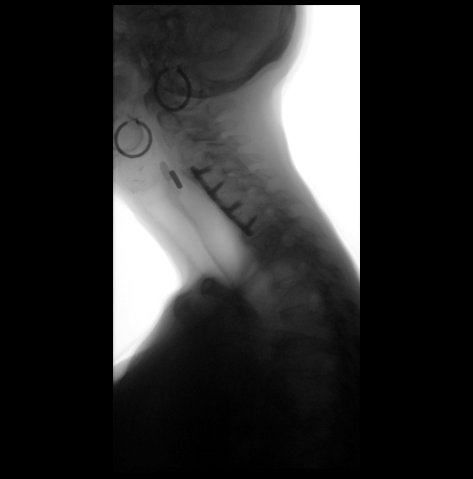
[frame 6/7]
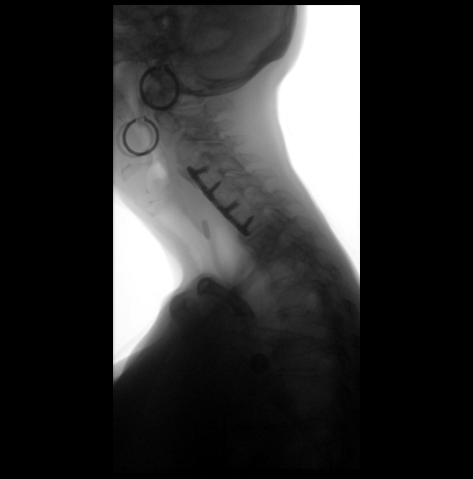

[22 of 24 positions shown; findings below may reference images not displayed]

FINDINGS: AP and lateral pharyngeal imaging shows no aspiration or obstructive
process. No diverticulum.

The esophagus has normal distensibility and smooth mucosal contour.
Unfortunately, images from RAO images did not save, there was no
specific esophageal dysmotility. No stricture was seen.

Presumed nonvisualized gastroesophageal reflux based on intermittent
visualization of barium within the esophagus.

A 13 mm bad barium tablet lodged in the vallecula, which reproduced
the patient's symptoms. This appeared to be related to poor
epiglottis turnover, at least partially due to an osteophyte
anterior to C3-4, possibly reactive to a C4-C7 ACDF plate.
IMPRESSION: 1. Poor epiglottis turnover at least partially due to anterior
osteophyte at C3-4, causing stasis of pill in the vallecula. This
reproduced the patient's symptoms.
2. Suspect gastroesophageal reflux.  No esophageal stricture.
3. On the final images trace barium was seen at the level of the
cords, attributed to nonvisualized laryngeal penetration.

## 2020-01-01 ENCOUNTER — Other Ambulatory Visit: Payer: Self-pay

## 2020-01-01 ENCOUNTER — Ambulatory Visit: Payer: 59 | Admitting: Cardiology

## 2020-01-01 ENCOUNTER — Encounter: Payer: Self-pay | Admitting: Cardiology

## 2020-01-01 VITALS — BP 128/80 | HR 74 | Ht 64.0 in | Wt 134.4 lb

## 2020-01-01 DIAGNOSIS — R0609 Other forms of dyspnea: Secondary | ICD-10-CM

## 2020-01-01 DIAGNOSIS — R0600 Dyspnea, unspecified: Secondary | ICD-10-CM | POA: Diagnosis not present

## 2020-01-01 DIAGNOSIS — R0789 Other chest pain: Secondary | ICD-10-CM | POA: Diagnosis not present

## 2020-01-01 DIAGNOSIS — I2510 Atherosclerotic heart disease of native coronary artery without angina pectoris: Secondary | ICD-10-CM | POA: Diagnosis not present

## 2020-01-01 DIAGNOSIS — I5181 Takotsubo syndrome: Secondary | ICD-10-CM | POA: Diagnosis not present

## 2020-01-01 NOTE — Patient Instructions (Signed)

## 2020-01-01 NOTE — Progress Notes (Signed)
Cardiology Office Note:    Date:  01/01/2020   ID:  Kathryn [name], DOB [birth year], MRN [id number]  PCP:  Ernestene Kiel, MD  Cardiologist:  Jenne Campus, MD    Referring MD: Ernestene Kiel, MD   No chief complaint on file. I am doing fine  History of Present Illness:    Kathryn [name] is a 64 y.o. female with past medical history significant for nonischemic cardiomyopathy ejection fraction 4045%, cardiac catheterization showing only 50% LAD stenosis, stress test showing no evidence of ischemia.  She comes today to my office for follow-up.  Overall she is doing well.  Denies have any chest pain tightness squeezing pressure burning chest.  She also have some shortness of breath but does not bother her much.  She did have carpal tunnel syndrome surgery done.  Things went well except for to find a few days later she fell down and she actually had open wound which luckily healed nicely now.  Past Medical History:  Diagnosis Date  . Anxiety   . Arthritis   . Heart attack ([place]) 10/2017  . Pneumonia   . Skin cancer (melanoma) ([place])   . Stress incontinence   . Tobacco use   . UTI (urinary tract infection)     Past Surgical History:  Procedure Laterality Date  . ABDOMINAL HYSTERECTOMY     cervical cancer  . back skin flap    . BACK SURGERY     lumbar and cervical  . hernia surgery     X2   . LEFT HEART CATH AND CORONARY ANGIOGRAPHY N/A 10/30/2017   Procedure: LEFT HEART CATH AND CORONARY ANGIOGRAPHY;  Surgeon: Lorretta Harp, MD;  Location: [facility];  Service: Cardiovascular;  Laterality: N/A;  . MANDIBLE SURGERY     lower jaw  . MELANOMA EXCISION     L arm and L leg, 2014  . muscle flap surgery    . NECK SURGERY    . NOSE SURGERY  2012   Lost her nose due to cancer  . SKIN CANCER EXCISION     squamous cell cancer , 2012  . WRIST SURGERY      Current Medications: Current Meds  Medication Sig  . ALPRAZolam (XANAX) 0.25 MG tablet Take 0.25 mg  by mouth at bedtime as needed for anxiety.   Marland Kitchen aspirin EC 81 MG tablet Take 81 mg by mouth daily.  Marland Kitchen atorvastatin (LIPITOR) 80 MG tablet TAKE 1 TABLET BY MOUTH ONCE DAILY AT 6PM  . estrogens-methylTEST 1.25-2.5 MG TABS per tablet Take 1 tablet by mouth daily.  . furosemide (LASIX) 20 MG tablet TAKE 1 TABLET BY MOUTH ONCE DAILY AS NEEDED.  Marland Kitchen losartan (COZAAR) 25 MG tablet TAKE 1 TABLET BY MOUTH TWICE(2) DAILY  . morphine (MS CONTIN) 30 MG 12 hr tablet Take 15 mg by mouth every 8 (eight) hours as needed.   . Nutritional Supplements (JUICE PLUS FIBRE PO) Take 3 capsules by mouth daily.   Marland Kitchen oxybutynin (DITROPAN XL) 15 MG 24 hr tablet 15 mg as needed.   . polyethylene glycol (MIRALAX / GLYCOLAX) packet Take 8.6 g by mouth as needed.   . progesterone (PROMETRIUM) 200 MG capsule Take 200 mg by mouth daily.  . ranolazine (RANEXA) 500 MG 12 hr tablet TAKE 1 TABLET BY MOUTH TWICE DAILY.  . traZODone (DESYREL) 150 MG tablet Take 150 mg by mouth at bedtime.      Allergies:   Cephalosporins   Social History  Socioeconomic History  . Marital status: Married    Spouse name: Not on file  . Number of children: 2  . Years of education: Not on file  . Highest education level: Not on file  Occupational History  . Not on file  Tobacco Use  . Smoking status: Current Some Day Smoker    Packs/day: 0.25    Years: 40.00    Pack years: 10.00    Types: Cigarettes  . Smokeless tobacco: Never Used  Vaping Use  . Vaping Use: Never used  Substance and Sexual Activity  . Alcohol use: No  . Drug use: No  . Sexual activity: Not on file  Other Topics Concern  . Not on file  Social History Narrative  . Not on file   Social Determinants of Health   Financial Resource Strain:   . Difficulty of Paying Living Expenses: Not on file  Food Insecurity:   . Worried About Charity fundraiser in the Last Year: Not on file  . Ran Out of Food in the Last Year: Not on file  Transportation Needs:   . Lack of  Transportation (Medical): Not on file  . Lack of Transportation (Non-Medical): Not on file  Physical Activity:   . Days of Exercise per Week: Not on file  . Minutes of Exercise per Session: Not on file  Stress:   . Feeling of Stress : Not on file  Social Connections:   . Frequency of Communication with Friends and Family: Not on file  . Frequency of Social Gatherings with Friends and Family: Not on file  . Attends Religious Services: Not on file  . Active Member of Clubs or Organizations: Not on file  . Attends Archivist Meetings: Not on file  . Marital Status: Not on file     Family History: The patient's family history includes Breast cancer in her mother; COPD in her father; Colon polyps in her mother; Diabetes in her father; Heart attack in her father; Heart disease in her father; Hypertension in her father; Kidney disease in her father. There is no history of Colon cancer or Esophageal cancer. ROS:   Please see the history of present illness.    All 14 point review of systems negative except as described per history of present illness  EKGs/Labs/Other Studies Reviewed:      Recent Labs: 06/26/2019: BUN 14; Creatinine, Ser 0.73; NT-Pro BNP 134; Potassium 5.2; Sodium 141  Recent Lipid Panel    Component Value Date/Time   CHOL 138 10/30/2017 0227   TRIG 25 10/30/2017 0227   HDL 69 10/30/2017 0227   CHOLHDL 2.0 10/30/2017 0227   VLDL 5 10/30/2017 0227   LDLCALC 64 10/30/2017 0227    Physical Exam:    VS:  BP 128/80   Pulse 74   Ht 5\' 4"  (1.626 m)   Wt 134 lb 6.4 oz (61 kg)   SpO2 96%   BMI 23.07 kg/m     Wt Readings from Last 3 Encounters:  01/01/20 134 lb 6.4 oz (61 kg)  09/30/19 131 lb 9.6 oz (59.7 kg)  06/26/19 125 lb 3.2 oz (56.8 kg)     GEN:  Well nourished, well developed in no acute distress HEENT: Normal NECK: No JVD; No carotid bruits LYMPHATICS: No lymphadenopathy CARDIAC: RRR, no murmurs, no rubs, no gallops RESPIRATORY:  Clear to  auscultation without rales, wheezing or rhonchi  ABDOMEN: Soft, non-tender, non-distended MUSCULOSKELETAL:  No edema; No deformity  SKIN: Warm and  dry LOWER EXTREMITIES: no swelling NEUROLOGIC:  Alert and oriented x 3 PSYCHIATRIC:  Normal affect   ASSESSMENT:    1. Takotsubo cardiomyopathy   2. Coronary artery disease involving native coronary artery of native heart without angina pectoris   3. Dyspnea on exertion   4. Atypical chest pain    PLAN:    In order of problems listed above:  1. History of cardiomyopathy felt to be Takotsubo.  With improvement now ejection fraction 50%.  She was unable to afford Entresto therefore his losartan which I will continue.  We talked today about potentially augmenting her medication however she does not want to do it since she is feeling well. 2. Coronary disease 50% stenosis noted on the cardiac catheterization.  She is on aspirin as well as statin which I will continue. 3. Dyslipidemia: She is on high intense statin in form of Lipitor 80 which I will continue.  I did review her K PN I see last LDL of 34 and HDL of 75.  We will continue present management. 4. Dyspnea on exertion persistent present no change.  Continue present management. 5. Atypical chest pain: Denies having any   Medication Adjustments/Labs and Tests Ordered: Current medicines are reviewed at length with the patient today.  Concerns regarding medicines are outlined above.  No orders of the defined types were placed in this encounter.  Medication changes: No orders of the defined types were placed in this encounter.   Signed, Park Liter, MD, [facility] 01/01/2020 1:45 PM    [facility]

## 2020-01-21 ENCOUNTER — Ambulatory Visit: Payer: 59 | Admitting: Cardiology

## 2020-05-19 ENCOUNTER — Telehealth: Payer: Self-pay | Admitting: *Deleted

## 2020-05-19 MED ORDER — LOSARTAN POTASSIUM 25 MG PO TABS: ORAL_TABLET | ORAL | 1 refills | Status: DC

## 2020-05-19 MED ORDER — ATORVASTATIN CALCIUM 80 MG PO TABS: ORAL_TABLET | ORAL | 1 refills | Status: DC

## 2020-05-19 NOTE — Telephone Encounter (Signed)
Rx refill sent to pharmacy. 

## 2020-05-22 ENCOUNTER — Telehealth: Payer: Self-pay | Admitting: Cardiology

## 2020-05-22 NOTE — Telephone Encounter (Signed)
Pt c/o Shortness Of Breath: STAT if SOB developed within the last 24 hours or pt is noticeably SOB on the phone  1. Are you currently SOB (can you hear that pt is SOB on the phone)? no  2. How long have you been experiencing SOB? Worse the last 2 weeks- started the last of February   3. Are you SOB when sitting or when up moving around? Only when she moves around, even when she takes her shower and put her clothes on  4. Are you currently experiencing any other symptoms? Very fatigued, still a little chest pain off and on- this have been going for about 3 years- pt wants to be seen

## 2020-05-22 NOTE — Telephone Encounter (Signed)
RN called patient in regards to call of SOB and fatique. Pt states that she has had sob and fatique on and off since having covid in 2020, and states she was told that this was going to be a long term effect. Pt states that she does still have her lasix 20 mg as needed, but has not taken it. Patient stated she was due for an appointment and was schedule for 06/11/2020 at 0340 pm. Pt stated she would try her lasix if she got sob and see if that improves her symptoms.  RN stated to try her lasix medication, and come to her appointment 06/11/2020; and if her symptoms worsening and sob get to where if it is not improving even with her lasix, to call office or EMS.  Patient agreed to the plan and stated that she didn't have any other concerns or questions.  Manuela Schwartz RN

## 2020-06-10 ENCOUNTER — Other Ambulatory Visit: Payer: Self-pay

## 2020-06-10 DIAGNOSIS — N390 Urinary tract infection, site not specified: Secondary | ICD-10-CM | POA: Insufficient documentation

## 2020-06-10 DIAGNOSIS — J189 Pneumonia, unspecified organism: Secondary | ICD-10-CM | POA: Insufficient documentation

## 2020-06-10 DIAGNOSIS — F419 Anxiety disorder, unspecified: Secondary | ICD-10-CM | POA: Insufficient documentation

## 2020-06-10 DIAGNOSIS — N393 Stress incontinence (female) (male): Secondary | ICD-10-CM | POA: Insufficient documentation

## 2020-06-10 DIAGNOSIS — C439 Malignant melanoma of skin, unspecified: Secondary | ICD-10-CM | POA: Insufficient documentation

## 2020-06-10 DIAGNOSIS — Z720 Tobacco use: Secondary | ICD-10-CM | POA: Insufficient documentation

## 2020-06-10 DIAGNOSIS — M1990 Unspecified osteoarthritis, unspecified site: Secondary | ICD-10-CM | POA: Insufficient documentation

## 2020-06-11 ENCOUNTER — Other Ambulatory Visit: Payer: Self-pay

## 2020-06-11 ENCOUNTER — Encounter: Payer: Self-pay | Admitting: Cardiology

## 2020-06-11 ENCOUNTER — Ambulatory Visit: Payer: 59 | Admitting: Cardiology

## 2020-06-11 VITALS — BP 112/64 | HR 74 | Ht 64.0 in | Wt 135.0 lb

## 2020-06-11 DIAGNOSIS — R0600 Dyspnea, unspecified: Secondary | ICD-10-CM

## 2020-06-11 DIAGNOSIS — I5181 Takotsubo syndrome: Secondary | ICD-10-CM | POA: Diagnosis not present

## 2020-06-11 DIAGNOSIS — R0789 Other chest pain: Secondary | ICD-10-CM | POA: Diagnosis not present

## 2020-06-11 DIAGNOSIS — I2510 Atherosclerotic heart disease of native coronary artery without angina pectoris: Secondary | ICD-10-CM | POA: Diagnosis not present

## 2020-06-11 DIAGNOSIS — R0609 Other forms of dyspnea: Secondary | ICD-10-CM

## 2020-06-11 NOTE — Addendum Note (Signed)
Addended by: Senaida Ores on: 06/11/2020 04:24 PM   Modules accepted: Orders

## 2020-06-11 NOTE — Progress Notes (Signed)
Cardiology Office Note:    Date:  06/11/2020   ID:  MARQUETA PULLEY, DOB 1955-04-24, MRN [id number]  PCP:  Ernestene Kiel, MD  Cardiologist:  Jenne Campus, MD    Referring MD: Ernestene Kiel, MD   Chief Complaint  Patient presents with  . Shortness of Breath    History of Present Illness:    Kathryn [name] is a 65 y.o. female with past medical history significant for nonischemic cardiomyopathy with initial ejection fraction 40 to 45%, cardiac catheterization done at that time showed only 50% LAD stenosis, she was told to have Takotsubo syndrome and manage appropriately medically.  She improved with ejection fraction last time assessed in December of last year 50%.  She comes today to my office because about 2 weeks ago she started having some shortness of breath.  There is no swelling of lower extremities there is no paroxysmal dyspnea.  She said she was very weak tired and exhausted she denies have any chest pain Shortness of breath this week she feels better but still very weak and tired.  The point that she cannot wash dishes at home.  Past Medical History:  Diagnosis Date  . Anxiety   . Arthritis   . Atypical chest pain 08/23/2018  . Chronic pain 11/02/2017  . Cigarette smoker 11/27/2017  . Coronary artery disease 50% mid LAD and mid circumflex on cardiac catheterization 2018 11/15/2018  . COVID-19 virus infection 08/30/2018  . Dyspnea on exertion 08/23/2018  . Heart attack ([place]) 10/2017  . Infection due to 2019 novel coronavirus 08/28/2018   Formatting of this note might be different from the original. Notification of POS result Isolation since day of testing of patient and family members [place] [facility] will call with specific instructions Conv Care their POC until the [facility] contacts them Obtaining testing for symptomatic Family Members Discuss self-isolation measures Discuss in-home separation and safety measures Discuss web-based C  . Mass of right axilla 08/04/2015  .  Neck pain 10/11/2011  . Personal history of malignant neoplasm of nasal cavities, middle ear, and accessory sinuses 11/10/2011  . Pneumonia   . Skin cancer (melanoma) ([place])   . Stress incontinence   . Takotsubo cardiomyopathy   . Tobacco use   . UTI (urinary tract infection)     Past Surgical History:  Procedure Laterality Date  . ABDOMINAL HYSTERECTOMY     cervical cancer  . back skin flap    . BACK SURGERY     lumbar and cervical  . hernia surgery     X2   . LEFT HEART CATH AND CORONARY ANGIOGRAPHY N/A 10/30/2017   Procedure: LEFT HEART CATH AND CORONARY ANGIOGRAPHY;  Surgeon: Lorretta Harp, MD;  Location: [facility];  Service: Cardiovascular;  Laterality: N/A;  . MANDIBLE SURGERY     lower jaw  . MELANOMA EXCISION     L arm and L leg, 2014  . muscle flap surgery    . NECK SURGERY    . NOSE SURGERY  2012   Lost her nose due to cancer  . SKIN CANCER EXCISION     squamous cell cancer , 2012  . WRIST SURGERY      Current Medications: Current Meds  Medication Sig  . ALPRAZolam (XANAX) 0.25 MG tablet Take 0.25 mg by mouth at bedtime as needed for anxiety.   Marland Kitchen aspirin EC 81 MG tablet Take 81 mg by mouth daily.  Marland Kitchen atorvastatin (LIPITOR) 80 MG tablet TAKE 1 TABLET  BY MOUTH ONCE DAILY AT 6PM (Patient taking differently: Take 80 mg by mouth daily. TAKE 1 TABLET BY MOUTH ONCE DAILY AT 6PM)  . estrogens-methylTEST 1.25-2.5 MG TABS per tablet Take 1 tablet by mouth daily.  . furosemide (LASIX) 20 MG tablet TAKE 1 TABLET BY MOUTH ONCE DAILY AS NEEDED. (Patient taking differently: Take 20 mg by mouth as needed for fluid.)  . losartan (COZAAR) 25 MG tablet TAKE 1 TABLET BY MOUTH TWICE(2) DAILY (Patient taking differently: Take 25 mg by mouth in the morning and at bedtime. TAKE 1 TABLET BY MOUTH TWICE(2) DAILY)  . morphine (MS CONTIN) 30 MG 12 hr tablet Take 15 mg by mouth 2 (two) times daily.  Marland Kitchen oxybutynin (DITROPAN XL) 15 MG 24 hr tablet 15 mg as needed (Bowel or bladder  control).  . polyethylene glycol (MIRALAX / GLYCOLAX) packet Take 8.6 g by mouth as needed for mild constipation.  . progesterone (PROMETRIUM) 200 MG capsule Take 200 mg by mouth daily.  . ranolazine (RANEXA) 500 MG 12 hr tablet TAKE 1 TABLET BY MOUTH TWICE DAILY. (Patient taking differently: Take 500 mg by mouth 2 (two) times daily.)  . traZODone (DESYREL) 150 MG tablet Take 150 mg by mouth at bedtime.      Allergies:   Cephalosporins   Social History   Socioeconomic History  . Marital status: Married    Spouse name: Not on file  . Number of children: 2  . Years of education: Not on file  . Highest education level: Not on file  Occupational History  . Not on file  Tobacco Use  . Smoking status: Current Some Day Smoker    Packs/day: 0.25    Years: 40.00    Pack years: 10.00    Types: Cigarettes  . Smokeless tobacco: Never Used  Vaping Use  . Vaping Use: Never used  Substance and Sexual Activity  . Alcohol use: No  . Drug use: No  . Sexual activity: Not on file  Other Topics Concern  . Not on file  Social History Narrative  . Not on file   Social Determinants of Health   Financial Resource Strain: Not on file  Food Insecurity: Not on file  Transportation Needs: Not on file  Physical Activity: Not on file  Stress: Not on file  Social Connections: Not on file     Family History: The patient's family history includes Breast cancer in her mother; COPD in her father; Colon polyps in her mother; Diabetes in her father; Heart attack in her father; Heart disease in her father; Hypertension in her father; Kidney disease in her father. There is no history of Colon cancer or Esophageal cancer. ROS:   Please see the history of present illness.    All 14 point review of systems negative except as described per history of present illness  EKGs/Labs/Other Studies Reviewed:      Recent Labs: 06/26/2019: BUN 14; Creatinine, Ser 0.73; NT-Pro BNP 134; Potassium 5.2; Sodium 141   Recent Lipid Panel    Component Value Date/Time   CHOL 138 10/30/2017 0227   TRIG 25 10/30/2017 0227   HDL 69 10/30/2017 0227   CHOLHDL 2.0 10/30/2017 0227   VLDL 5 10/30/2017 0227   LDLCALC 64 10/30/2017 0227    Physical Exam:    VS:  BP 112/64 (BP Location: Right Arm, Patient Position: Sitting)   Pulse 74   Ht 5\' 4"  (1.626 m)   Wt 135 lb (61.2 kg)   SpO2 97%  BMI 23.17 kg/m     Wt Readings from Last 3 Encounters:  06/11/20 135 lb (61.2 kg)  01/01/20 134 lb 6.4 oz (61 kg)  09/30/19 131 lb 9.6 oz (59.7 kg)     GEN:  Well nourished, well developed in no acute distress HEENT: Normal NECK: No JVD; No carotid bruits LYMPHATICS: No lymphadenopathy CARDIAC: RRR, no murmurs, no rubs, no gallops RESPIRATORY:  Clear to auscultation without rales, wheezing or rhonchi  ABDOMEN: Soft, non-tender, non-distended MUSCULOSKELETAL:  No edema; No deformity  SKIN: Warm and dry LOWER EXTREMITIES: no swelling NEUROLOGIC:  Alert and oriented x 3 PSYCHIATRIC:  Normal affect   ASSESSMENT:    1. Coronary artery disease involving native coronary artery of native heart without angina pectoris   2. Takotsubo cardiomyopathy   3. Atypical chest pain   4. Dyspnea on exertion    PLAN:    In order of problems listed above:  1. Coronary disease only 50% stenosis based on last cardiac catheterization.  EKG did not show any acute changes.  I doubt that this is coronary event but I will check her troponin I today. 2. History of Takotsubo cardiomyopathy I will check a proBNP Chem-7 as well as echocardiogram. 3. Atypical chest pain denies have any recently.  I will still check troponin. 4. Dyspnea on exertion multifactorial I will check also CBC as well as TSH will make sure were not missing something noncardiac.   Medication Adjustments/Labs and Tests Ordered: Current medicines are reviewed at length with the patient today.  Concerns regarding medicines are outlined above.  No orders of the  defined types were placed in this encounter.  Medication changes: No orders of the defined types were placed in this encounter.   Signed, Park Liter, MD, [facility] 06/11/2020 4:09 PM    [facility]

## 2020-06-11 NOTE — Patient Instructions (Signed)
Medication Instructions:  Your physician recommends that you continue on your current medications as directed. Please refer to the Current Medication list given to you today.  *If you need a refill on your cardiac medications before your next appointment, please call your pharmacy*   Lab Work: Your physician recommends that you return for lab work today: pro bnp, cmp, tsh, cbc  If you have labs (blood work) drawn today and your tests are completely normal, you will receive your results only by: Marland Kitchen MyChart Message (if you have MyChart) OR . A paper copy in the mail If you have any lab test that is abnormal or we need to change your treatment, we will call you to review the results.   Testing/Procedures: Your physician has requested that you have an echocardiogram. Echocardiography is a painless test that uses sound waves to create images of your heart. It provides your doctor with information about the size and shape of your heart and how well your heart's chambers and valves are working. This procedure takes approximately one hour. There are no restrictions for this procedure.     Follow-Up: At [facility], you and your health needs are our priority.  As part of our continuing mission to provide you with exceptional heart care, we have created designated Provider Care Teams.  These Care Teams include your primary Cardiologist (physician) and Advanced Practice Providers (APPs -  Physician Assistants and Nurse Practitioners) who all work together to provide you with the care you need, when you need it.  We recommend signing up for the patient portal called "MyChart".  Sign up information is provided on this After Visit Summary.  MyChart is used to connect with patients for Virtual Visits (Telemedicine).  Patients are able to view lab/test results, encounter notes, upcoming appointments, etc.  Non-urgent messages can be sent to your provider as well.   To learn more about what you can do with  MyChart, go to [email or web address].    Your next appointment:   1 month(s)  The format for your next appointment:   In Person  Provider:   Jenne Campus, MD   Other Instructions   Echocardiogram An echocardiogram is a test that uses sound waves (ultrasound) to produce images of the heart. Images from an echocardiogram can provide important information about:  Heart size and shape.  The size and thickness and movement of your heart's walls.  Heart muscle function and strength.  Heart valve function or if you have stenosis. Stenosis is when the heart valves are too narrow.  If blood is flowing backward through the heart valves (regurgitation).  A tumor or infectious growth around the heart valves.  Areas of heart muscle that are not working well because of poor blood flow or injury from a heart attack.  Aneurysm detection. An aneurysm is a weak or damaged part of an artery wall. The wall bulges out from the normal force of blood pumping through the body. Tell a health care provider about:  Any allergies you have.  All medicines you are taking, including vitamins, herbs, eye drops, creams, and over-the-counter medicines.  Any blood disorders you have.  Any surgeries you have had.  Any medical conditions you have.  Whether you are pregnant or may be pregnant. What are the risks? Generally, this is a safe test. However, problems may occur, including an allergic reaction to dye (contrast) that may be used during the test. What happens before the test? No specific preparation is needed. You may  eat and drink normally. What happens during the test?  You will take off your clothes from the waist up and put on a hospital gown.  Electrodes or electrocardiogram (ECG)patches may be placed on your chest. The electrodes or patches are then connected to a device that monitors your heart rate and rhythm.  You will lie down on a table for an ultrasound exam. A gel will  be applied to your chest to help sound waves pass through your skin.  A handheld device, called a transducer, will be pressed against your chest and moved over your heart. The transducer produces sound waves that travel to your heart and bounce back (or "echo" back) to the transducer. These sound waves will be captured in real-time and changed into images of your heart that can be viewed on a video monitor. The images will be recorded on a computer and reviewed by your health care provider.  You may be asked to change positions or hold your breath for a short time. This makes it easier to get different views or better views of your heart.  In some cases, you may receive contrast through an IV in one of your veins. This can improve the quality of the pictures from your heart. The procedure may vary among health care providers and hospitals.   What can I expect after the test? You may return to your normal, everyday life, including diet, activities, and medicines, unless your health care provider tells you not to do that. Follow these instructions at home:  It is up to you to get the results of your test. Ask your health care provider, or the department that is doing the test, when your results will be ready.  Keep all follow-up visits. This is important. Summary  An echocardiogram is a test that uses sound waves (ultrasound) to produce images of the heart.  Images from an echocardiogram can provide important information about the size and shape of your heart, heart muscle function, heart valve function, and other possible heart problems.  You do not need to do anything to prepare before this test. You may eat and drink normally.  After the echocardiogram is completed, you may return to your normal, everyday life, unless your health care provider tells you not to do that. This information is not intended to replace advice given to you by your health care provider. Make sure you discuss any  questions you have with your health care provider. Document Revised: 10/22/2019 Document Reviewed: 10/22/2019 Elsevier Patient Education  2021 [employer].

## 2020-06-12 LAB — CBC
Hematocrit: 39.9 % (ref 34.0–46.6)
Hemoglobin: 13.9 g/dL (ref 11.1–15.9)
MCH: 35.6 pg — ABNORMAL HIGH (ref 26.6–33.0)
MCHC: 34.8 g/dL (ref 31.5–35.7)
MCV: 102 fL — ABNORMAL HIGH (ref 79–97)
Platelets: 298 10*3/uL (ref 150–450)
RBC: 3.9 x10E6/uL (ref 3.77–5.28)
RDW: 12 % (ref 11.7–15.4)
WBC: 8.2 10*3/uL (ref 3.4–10.8)

## 2020-06-12 LAB — COMPREHENSIVE METABOLIC PANEL
ALT: 17 IU/L (ref 0–32)
AST: 20 IU/L (ref 0–40)
Albumin/Globulin Ratio: 2.4 — ABNORMAL HIGH (ref 1.2–2.2)
Albumin: 4.4 g/dL (ref 3.8–4.8)
Alkaline Phosphatase: 44 IU/L (ref 44–121)
BUN/Creatinine Ratio: 15 (ref 12–28)
BUN: 11 mg/dL (ref 8–27)
Bilirubin Total: 0.5 mg/dL (ref 0.0–1.2)
CO2: 23 mmol/L (ref 20–29)
Calcium: 9.3 mg/dL (ref 8.7–10.3)
Chloride: 104 mmol/L (ref 96–106)
Creatinine, Ser: 0.75 mg/dL (ref 0.57–1.00)
Globulin, Total: 1.8 g/dL (ref 1.5–4.5)
Glucose: 111 mg/dL — ABNORMAL HIGH (ref 65–99)
Potassium: 4.9 mmol/L (ref 3.5–5.2)
Sodium: 142 mmol/L (ref 134–144)
Total Protein: 6.2 g/dL (ref 6.0–8.5)
eGFR: 89 mL/min/{1.73_m2} (ref >59)

## 2020-06-12 LAB — PRO B NATRIURETIC PEPTIDE: NT-Pro BNP: 71 pg/mL (ref 0–287)

## 2020-06-12 LAB — TSH: TSH: 1.24 u[IU]/mL (ref 0.450–4.500)

## 2020-06-16 ENCOUNTER — Encounter: Payer: Self-pay | Admitting: Cardiology

## 2020-06-16 DIAGNOSIS — R079 Chest pain, unspecified: Secondary | ICD-10-CM | POA: Diagnosis not present

## 2020-07-22 ENCOUNTER — Other Ambulatory Visit: Payer: Self-pay

## 2020-07-22 ENCOUNTER — Ambulatory Visit: Payer: 59 | Admitting: Cardiology

## 2020-07-22 ENCOUNTER — Encounter: Payer: Self-pay | Admitting: Cardiology

## 2020-07-22 VITALS — BP 108/62 | HR 73 | Ht 64.0 in | Wt 138.0 lb

## 2020-07-22 DIAGNOSIS — I2510 Atherosclerotic heart disease of native coronary artery without angina pectoris: Secondary | ICD-10-CM | POA: Diagnosis not present

## 2020-07-22 DIAGNOSIS — R0609 Other forms of dyspnea: Secondary | ICD-10-CM

## 2020-07-22 DIAGNOSIS — I5181 Takotsubo syndrome: Secondary | ICD-10-CM | POA: Diagnosis not present

## 2020-07-22 DIAGNOSIS — R0600 Dyspnea, unspecified: Secondary | ICD-10-CM

## 2020-07-22 DIAGNOSIS — R0602 Shortness of breath: Secondary | ICD-10-CM | POA: Diagnosis not present

## 2020-07-22 NOTE — Progress Notes (Signed)
Cardiology Office Note:    Date:  07/22/2020   ID:  Kathryn [name], DOB [birth year], MRN [id number]  PCP:  Ernestene Kiel, MD  Cardiologist:  Jenne Campus, MD    Referring MD: Ernestene Kiel, MD   Chief Complaint  Patient presents with  . Follow-up  I am still short of breath  History of Present Illness:    Kathryn [name] is a 65 y.o. female with past medical history significant for Takotsubo cardiomyopathy, 2019 she had cardiac catheterization done which showed only 50% LAD stenosis.  Her usual ejection fraction was about 50%, however recent echocardiogram repeated on June 16, 2020 showing ejection fraction of 6065%.  Recently she has been complaining of having exertional shortness of breath we will try to figure out if it is related to her heart.  Still complain of having shortness of breath small effort will bring it up sometimes she does have wheezes.  No swelling of lower extremities no proximal nocturnal dyspnea no chest pain tightness squeezing pressure burning chest.  Past Medical History:  Diagnosis Date  . Anxiety   . Arthritis   . Atypical chest pain 08/23/2018  . Chronic pain 11/02/2017  . Cigarette smoker 11/27/2017  . Coronary artery disease 50% mid LAD and mid circumflex on cardiac catheterization 2018 11/15/2018  . COVID-19 virus infection 08/30/2018  . Dyspnea on exertion 08/23/2018  . Heart attack ([place]) 10/2017  . Infection due to 2019 novel coronavirus 08/28/2018   Formatting of this note might be different from the original. Notification of POS result Isolation since day of testing of patient and family members [place] [facility] will call with specific instructions Conv Care their POC until the LHD contacts them Obtaining testing for symptomatic Family Members Discuss self-isolation measures Discuss in-home separation and safety measures Discuss web-based C  . Mass of right axilla 08/04/2015  . Neck pain 10/11/2011  . Personal history of malignant neoplasm of  nasal cavities, middle ear, and accessory sinuses 11/10/2011  . Pneumonia   . Skin cancer (melanoma) ([place])   . Stress incontinence   . Takotsubo cardiomyopathy   . Tobacco use   . UTI (urinary tract infection)     Past Surgical History:  Procedure Laterality Date  . ABDOMINAL HYSTERECTOMY     cervical cancer  . back skin flap    . BACK SURGERY     lumbar and cervical  . hernia surgery     X2   . LEFT HEART CATH AND CORONARY ANGIOGRAPHY N/A 10/30/2017   Procedure: LEFT HEART CATH AND CORONARY ANGIOGRAPHY;  Surgeon: Lorretta Harp, MD;  Location: [facility];  Service: Cardiovascular;  Laterality: N/A;  . MANDIBLE SURGERY     lower jaw  . MELANOMA EXCISION     L arm and L leg, 2014  . muscle flap surgery    . NECK SURGERY    . NOSE SURGERY  2012   Lost her nose due to cancer  . SKIN CANCER EXCISION     squamous cell cancer , 2012  . WRIST SURGERY      Current Medications: Current Meds  Medication Sig  . ALPRAZolam (XANAX) 0.25 MG tablet Take 0.25 mg by mouth at bedtime as needed for anxiety.   Marland Kitchen aspirin EC 81 MG tablet Take 81 mg by mouth daily.  Marland Kitchen atorvastatin (LIPITOR) 80 MG tablet TAKE 1 TABLET BY MOUTH ONCE DAILY AT 6PM (Patient taking differently: Take 80 mg by mouth daily.)  . calcipotriene (  DOVONOX) 0.005 % cream Apply 1 application topically every morning.  . estrogens-methylTEST 1.25-2.5 MG TABS per tablet Take 1 tablet by mouth daily.  . fluorouracil (EFUDEX) 5 % cream Apply 1 application topically daily.  . furosemide (LASIX) 20 MG tablet TAKE 1 TABLET BY MOUTH ONCE DAILY AS NEEDED. (Patient taking differently: Take 20 mg by mouth as needed for fluid.)  . losartan (COZAAR) 25 MG tablet TAKE 1 TABLET BY MOUTH TWICE(2) DAILY (Patient taking differently: Take 25 mg by mouth in the morning and at bedtime.)  . morphine (MS CONTIN) 30 MG 12 hr tablet Take 15 mg by mouth 2 (two) times daily.  Marland Kitchen oxybutynin (DITROPAN XL) 15 MG 24 hr tablet Take 15 mg by mouth as  needed (Bowel or bladder control).  . polyethylene glycol (MIRALAX / GLYCOLAX) packet Take 8.6 g by mouth as needed for mild constipation.  . progesterone (PROMETRIUM) 200 MG capsule Take 200 mg by mouth daily.  . ranolazine (RANEXA) 500 MG 12 hr tablet TAKE 1 TABLET BY MOUTH TWICE DAILY. (Patient taking differently: Take 500 mg by mouth 2 (two) times daily.)  . traZODone (DESYREL) 150 MG tablet Take 150 mg by mouth at bedtime.      Allergies:   Cephalosporins   Social History   Socioeconomic History  . Marital status: Married    Spouse name: Not on file  . Number of children: 2  . Years of education: Not on file  . Highest education level: Not on file  Occupational History  . Not on file  Tobacco Use  . Smoking status: Current Some Day Smoker    Packs/day: 0.25    Years: 40.00    Pack years: 10.00    Types: Cigarettes  . Smokeless tobacco: Never Used  Vaping Use  . Vaping Use: Never used  Substance and Sexual Activity  . Alcohol use: No  . Drug use: No  . Sexual activity: Not on file  Other Topics Concern  . Not on file  Social History Narrative  . Not on file   Social Determinants of Health   Financial Resource Strain: Not on file  Food Insecurity: Not on file  Transportation Needs: Not on file  Physical Activity: Not on file  Stress: Not on file  Social Connections: Not on file     Family History: The patient's family history includes Breast cancer in her mother; COPD in her father; Colon polyps in her mother; Diabetes in her father; Heart attack in her father; Heart disease in her father; Hypertension in her father; Kidney disease in her father. There is no history of Colon cancer or Esophageal cancer. ROS:   Please see the history of present illness.    All 14 point review of systems negative except as described per history of present illness  EKGs/Labs/Other Studies Reviewed:      Recent Labs: 06/11/2020: ALT 17; BUN 11; Creatinine, Ser 0.75; Hemoglobin  13.9; NT-Pro BNP 71; Platelets 298; Potassium 4.9; Sodium 142; TSH 1.240  Recent Lipid Panel    Component Value Date/Time   CHOL 138 10/30/2017 0227   TRIG 25 10/30/2017 0227   HDL 69 10/30/2017 0227   CHOLHDL 2.0 10/30/2017 0227   VLDL 5 10/30/2017 0227   LDLCALC 64 10/30/2017 0227    Physical Exam:    VS:  BP 108/62 (BP Location: Left Arm, Patient Position: Sitting)   Pulse 73   Ht 5\' 4"  (1.626 m)   Wt 138 lb (62.6 kg)  SpO2 99%   BMI 23.69 kg/m     Wt Readings from Last 3 Encounters:  07/22/20 138 lb (62.6 kg)  06/11/20 135 lb (61.2 kg)  01/01/20 134 lb 6.4 oz (61 kg)     GEN:  Well nourished, well developed in no acute distress HEENT: Normal NECK: No JVD; No carotid bruits LYMPHATICS: No lymphadenopathy CARDIAC: RRR, no murmurs, no rubs, no gallops RESPIRATORY: Poor air entry bilaterally.  Clear to auscultation without rales, wheezing or rhonchi  ABDOMEN: Soft, non-tender, non-distended MUSCULOSKELETAL:  No edema; No deformity  SKIN: Warm and dry LOWER EXTREMITIES: no swelling NEUROLOGIC:  Alert and oriented x 3 PSYCHIATRIC:  Normal affect   ASSESSMENT:    1. Shortness of breath   2. Takotsubo cardiomyopathy   3. Coronary artery disease involving native coronary artery of native heart without angina pectoris   4. Dyspnea on exertion    PLAN:    In order of problems listed above:  1. Shortness of breath which is multifactorial smoking of course play significant role here I did cardiac work-up trying to figure out if it is heart related which does not appears to be.  Echocardiogram showed improvement left ventricle ejection fraction, she does have diastolic dysfunction with a relaxation abnormality which means pulmonary artery wedge pressure is normal.  She on her own started taking small dose of diuretic but she cannot tell me if there is any improvement.  I did proBNP which is also normal I have multiple indicators telling me that this is not congestive heart  failure.  She will be referred to pulmonary for evaluation of COPD.  She thinks it may be a sequela of COVID-19 infection which is a possibility. 2. Coronary artery disease up to 50% stenosis of LAD.  I did review her K PN which show her LDL of 45 and HDL of 71 she is taking high intense statin Lipitor 80 which I will continue. 3. History of Takotsubo with normalization.  Echocardiogram reviewed with the patient today.    Medication Adjustments/Labs and Tests Ordered: Current medicines are reviewed at length with the patient today.  Concerns regarding medicines are outlined above.  Orders Placed This Encounter  Procedures  . Ambulatory referral to Pulmonology   Medication changes: No orders of the defined types were placed in this encounter.   Signed, Park Liter, MD, [facility] 07/22/2020 10:35 AM    [place]

## 2020-07-22 NOTE — Patient Instructions (Signed)

## 2020-07-30 MED ORDER — RANOLAZINE ER 500 MG PO TB12: 500.0000 mg | ORAL_TABLET | Freq: Two times a day (BID) | ORAL | 1 refills | Status: DC

## 2020-07-30 MED ORDER — RANOLAZINE ER 500 MG PO TB12: 500.0000 mg | ORAL_TABLET | Freq: Two times a day (BID) | ORAL | 2 refills | Status: DC

## 2020-07-30 NOTE — Addendum Note (Signed)
Addended by: Senaida Ores on: 07/30/2020 02:29 PM   Modules accepted: Orders

## 2020-08-12 ENCOUNTER — Ambulatory Visit: Payer: Self-pay | Admitting: Pulmonary Disease

## 2020-08-12 ENCOUNTER — Other Ambulatory Visit: Payer: Self-pay

## 2020-08-12 ENCOUNTER — Encounter: Payer: Self-pay | Admitting: Pulmonary Disease

## 2020-08-12 VITALS — BP 120/68 | HR 70 | Temp 97.2°F | Ht 64.0 in | Wt 137.6 lb

## 2020-08-12 DIAGNOSIS — R911 Solitary pulmonary nodule: Secondary | ICD-10-CM

## 2020-08-12 DIAGNOSIS — R0600 Dyspnea, unspecified: Secondary | ICD-10-CM

## 2020-08-12 DIAGNOSIS — R0609 Other forms of dyspnea: Secondary | ICD-10-CM

## 2020-08-12 DIAGNOSIS — Z720 Tobacco use: Secondary | ICD-10-CM

## 2020-08-12 MED ORDER — ANORO ELLIPTA 62.5-25 MCG/INH IN AEPB: 1.0000 | INHALATION_SPRAY | Freq: Every day | RESPIRATORY_TRACT | 0 refills | Status: DC

## 2020-08-12 NOTE — Addendum Note (Signed)
Addended by: Valerie Salts on: 08/12/2020 12:08 PM   Modules accepted: Orders

## 2020-08-12 NOTE — Patient Instructions (Addendum)
Nice to meet you  Try this inhaler, Anoro, 1 puff daily.  This is designed to open up the air tubes in your lung and help get air in and out more effectively.  If this is very helpful, please call us and let us know and I can send a prescription for this or similar medication if you know what your insurance prefers.  To further evaluate the shortness of breath, I have ordered pulmonary function tests.  These can be scheduled at your convenience.  Sometime in the next few days to couple of weeks.  We will let you know the results of this test.  Return to clinic for follow-up in 3 months with Dr. Silas Flood or sooner as needed  Notification of test results are managed in the following manner: If there are  any recommendations or changes to the  plan of care discussed in office today,  we will contact you and let you know what they are. If you do not hear from [place], then your results are normal and you can view them through your  MyChart account , or a letter will be sent to you. Thank you again for trusting [place] with your care  [facility].

## 2020-08-12 NOTE — Progress Notes (Signed)
@Patient  ID: Kathryn [name], female    DOB: 1955/04/29, 65 y.o.   MRN: [id number]  Chief Complaint  Patient presents with  . Consult    Referred by cardiologist for SOB. States SOB started after her MI back in 2019. Each year it has gotten worse, especially with activity. Also states her SOB increases with heat and humidity.     Referring provider: Park Liter, MD  HPI:   65 year old whom we are seeing in consultation for evaluation of dyspnea on exertion.  Most recent cardiology note x2 reviewed.  Patient notes onset dyspnea on exertion since hospitalization for Takotsubo's cardiomyopathy in 2019.  She says ever since her "heart attack."  That admission she was noted to have decreased EF.  She had a left heart catheterization that showed nonobstructive CAD 50% lesions ostial to proximal LAD, mid circumflex 10/2017.  Prior to this admission she was quite active exercising in the gym every day.  Walking multiple miles regularly.  Has been an avid heavy lifter until 2007 when she had neck surgery.  This is complicated by wound dehiscence and multiple infections and revision surgeries.  Since that time she did not lift weights as heavily.  She was push mowing an acre prior to that hospitalization in 2019.  Since then, she is much less active.  Not exercising.  Unable to do the push mower this year.  Tried and 2021 but could not complete the whole yard.  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  Down to 2 to 5 cigarettes a day but smoked more in the past.  Never more than a pack a day.  She denies significant seasonal allergy symptoms.  Eyes a bit more watery this spring.  No nasal congestion.  No cough.  No history of recurrent bronchitis or pneumonias.  No history of asthma.  Dyspnea present and worse on inclines compared to flat surfaces.  Any type of weight or additional exertion other than walking on flat surfaces you have significant dyspnea on  exertion.  No timing during today with things are better or worse.  No environmental factors that she can identify to make things better or worse.  No sick seasonal changes to make things better or worse.  No position where things are better or worse.  No alleviating or exacerbating factors that she can readily identify.  She had COVID 08/2018.  CT scan 10/2018 to follow-up the symptoms demonstrate on my interpretation trivial emphysematous changes in the upper lobe, right specifically, mild mosaicism, otherwise clear lungs.  Radiologist comments a 4 mm right lung nodule that to my review has not been followed up.  PMH: Nonobstructive CAD, cancer, Takotsubo's with recent TTE 4/22 normal EF per cardiology note (cannot view report) Surgical history: Neck surgery 2007, hysterectomy 1983, tubal ligation 1981 Family history: Emphysema grandfather, CAD in father, cancer in mother Social history: Current everyday smoker down to less than 1/4 pack, smokes history of high school, lives in [place] / Pulmonary Flowsheets:   ACT:  No flowsheet data found.  MMRC: mMRC Dyspnea Scale mMRC Score  08/12/2020 3    Epworth:  No flowsheet data found.  Tests:   FENO:  No results found for: NITRICOXIDE  PFT: No flowsheet data found.  WALK:  No flowsheet data found.  Imaging: Personally reviewed and as per EMR discussion this note  Lab Results: Personally reviewed and as per EMR, no anemia CBC    Component  Value Date/Time   WBC 8.2 06/11/2020 1622   WBC 11.6 (H) 10/31/2017 0539   RBC 3.90 06/11/2020 1622   RBC 4.00 10/31/2017 0539   HGB 13.9 06/11/2020 1622   HCT 39.9 06/11/2020 1622   PLT 298 06/11/2020 1622   MCV 102 (H) 06/11/2020 1622   MCH 35.6 (H) 06/11/2020 1622   MCH 33.8 10/31/2017 0539   MCHC 34.8 06/11/2020 1622   MCHC 32.9 10/31/2017 0539   RDW 12.0 06/11/2020 1622    BMET    Component Value Date/Time   NA 142 06/11/2020 1622   K 4.9 06/11/2020 1622   CL  104 06/11/2020 1622   CO2 23 06/11/2020 1622   GLUCOSE 111 (H) 06/11/2020 1622   GLUCOSE 121 (H) 10/31/2017 0539   BUN 11 06/11/2020 1622   CREATININE 0.75 06/11/2020 1622   CALCIUM 9.3 06/11/2020 1622   GFRNONAA 88 06/26/2019 1134   GFRAA 101 06/26/2019 1134    BNP    Component Value Date/Time   BNP 300.7 (H) 10/30/2017 0227    ProBNP    Component Value Date/Time   PROBNP 71 06/11/2020 1622    Specialty Problems      Pulmonary Problems   Personal history of malignant neoplasm of nasal cavities, middle ear, and accessory sinuses   Dyspnea on exertion   Pneumonia      Allergies  Allergen Reactions  . Cephalosporins Rash     There is no immunization history on file for this patient.  Past Medical History:  Diagnosis Date  . Anxiety   . Arthritis   . Atypical chest pain 08/23/2018  . Chronic pain 11/02/2017  . Cigarette smoker 11/27/2017  . Coronary artery disease 50% mid LAD and mid circumflex on cardiac catheterization 2018 11/15/2018  . COVID-19 virus infection 08/30/2018  . Dyspnea on exertion 08/23/2018  . Heart attack ([place]) 10/2017  . Infection due to 2019 novel coronavirus 08/28/2018   Formatting of this note might be different from the original. Notification of POS result Isolation since day of testing of patient and family members [place] Dept will call with specific instructions Conv Care their POC until the LHD contacts them Obtaining testing for symptomatic Family Members Discuss self-isolation measures Discuss in-home separation and safety measures Discuss web-based C  . Mass of right axilla 08/04/2015  . Neck pain 10/11/2011  . Personal history of malignant neoplasm of nasal cavities, middle ear, and accessory sinuses 11/10/2011  . Pneumonia   . Skin cancer (melanoma) ([place])   . Stress incontinence   . Takotsubo cardiomyopathy   . Tobacco use   . UTI (urinary tract infection)     Tobacco History: Social History   Tobacco Use  Smoking Status  Current Some Day Smoker  . Packs/day: 0.25  . Years: 40.00  . Pack years: 10.00  . Types: Cigarettes  Smokeless Tobacco Never Used   Ready to quit: Not Answered Counseling given: Not Answered   Continue to not smoke  Outpatient Encounter Medications as of 08/12/2020  Medication Sig  . ALPRAZolam (XANAX) 0.25 MG tablet Take 0.25 mg by mouth at bedtime as needed for anxiety.   Marland Kitchen aspirin EC 81 MG tablet Take 81 mg by mouth daily.  Marland Kitchen atorvastatin (LIPITOR) 80 MG tablet TAKE 1 TABLET BY MOUTH ONCE DAILY AT 6PM (Patient taking differently: Take 80 mg by mouth daily.)  . calcipotriene (DOVONOX) 0.005 % cream Apply 1 application topically every morning.  . estrogens-methylTEST 1.25-2.5 MG TABS per tablet Take  1 tablet by mouth daily.  . fluorouracil (EFUDEX) 5 % cream Apply 1 application topically daily.  . furosemide (LASIX) 20 MG tablet TAKE 1 TABLET BY MOUTH ONCE DAILY AS NEEDED. (Patient taking differently: Take 20 mg by mouth as needed for fluid.)  . losartan (COZAAR) 25 MG tablet TAKE 1 TABLET BY MOUTH TWICE(2) DAILY (Patient taking differently: Take 25 mg by mouth in the morning and at bedtime.)  . morphine (MS CONTIN) 30 MG 12 hr tablet Take 15 mg by mouth 2 (two) times daily.  Marland Kitchen oxybutynin (DITROPAN XL) 15 MG 24 hr tablet Take 15 mg by mouth as needed (Bowel or bladder control).  . polyethylene glycol (MIRALAX / GLYCOLAX) packet Take 8.6 g by mouth as needed for mild constipation.  . progesterone (PROMETRIUM) 200 MG capsule Take 200 mg by mouth daily.  . ranolazine (RANEXA) 500 MG 12 hr tablet Take 1 tablet (500 mg total) by mouth 2 (two) times daily.  . traZODone (DESYREL) 150 MG tablet Take 150 mg by mouth at bedtime.    No facility-administered encounter medications on file as of 08/12/2020.     Review of Systems  Review of Systems  No chest pain with exertion.  No orthopnea or PND.  Worsening lower extremity swelling over the last 3 to 4 weeks prompting resumption of as needed  Lasix.  Comprehensive review of systems otherwise negative. Physical Exam  BP 120/68   Pulse 70   Temp (!) 97.2 F (36.2 C) (Temporal)   Ht 5\' 4"  (1.626 m)   Wt 137 lb 9.6 oz (62.4 kg)   SpO2 98% Comment: on RA  BMI 23.62 kg/m   Wt Readings from Last 5 Encounters:  08/12/20 137 lb 9.6 oz (62.4 kg)  07/22/20 138 lb (62.6 kg)  06/11/20 135 lb (61.2 kg)  01/01/20 134 lb 6.4 oz (61 kg)  09/30/19 131 lb 9.6 oz (59.7 kg)    BMI Readings from Last 5 Encounters:  08/12/20 23.62 kg/m  07/22/20 23.69 kg/m  06/11/20 23.17 kg/m  01/01/20 23.07 kg/m  09/30/19 22.59 kg/m     Physical Exam General: Well-appearing, no acute distress Eyes: EOMI, icterus Neck: Supple no JVP appreciated Pulmonary: Clear to auscultation bilaterally, no wheeze, normal work of breathing Cardiovascular: Regular rate and rhythm, no murmurs Abdomen: Nondistended, bowel sounds present MSK: No synovitis, no joint effusion Neuro: Normal gait, no weakness Psych: Normal mood, full affect   Assessment & Plan:   Dyspnea on exertion: Chronic since 2019.  Suspect largest contributor is deconditioning given the amount of exercising she was doing prior to 2019 and relative lack of activity in comparison in the interim.  Recent TTE 4/22 reportedly with improved EF although cannot see full report, per cardiology note 07/2020.  Will obtain PFTs for further evaluation.  Has nonobstructive coronary disease.  History of smoking so COPD a possibility although she had really trivial to no emphysema on scan 2020.  Trial of Anoro to see if LAMA/LABA bronchodilation provide symptomatic benefit.  Tobacco abuse: She is trying to quit, cutting down on cigarette smoke per day.  Down to 2-5.  Encouraged her to continue to reduce to full abstinence.  Lung nodule: On the right, 4 mm.  Per Fleischner criteria and high risk patient with smoking history would recommend a 1 year follow-up.  This would have been due 10/2019.  CT chest  without contrast ordered for follow-up at this time.  Return in about 3 months (around 11/12/2020).   Bonna Gains  Yulissa Needham, MD 08/12/2020

## 2020-08-14 ENCOUNTER — Other Ambulatory Visit (HOSPITAL_COMMUNITY)
Admission: RE | Admit: 2020-08-14 | Discharge: 2020-08-14 | Disposition: A | Discharge status: Home / Self Care | Payer: MEDICARE | Source: Ambulatory Visit | Attending: Pulmonary Disease | Admitting: Pulmonary Disease

## 2020-08-14 DIAGNOSIS — Z01812 Encounter for preprocedural laboratory examination: Secondary | ICD-10-CM | POA: Diagnosis not present

## 2020-08-14 DIAGNOSIS — H26491 Other secondary cataract, right eye: Secondary | ICD-10-CM | POA: Diagnosis not present

## 2020-08-14 DIAGNOSIS — Z20822 Contact with and (suspected) exposure to covid-19: Secondary | ICD-10-CM | POA: Insufficient documentation

## 2020-08-14 LAB — SARS CORONAVIRUS 2 (TAT 6-24 HRS): SARS Coronavirus 2: NEGATIVE

## 2020-08-17 ENCOUNTER — Other Ambulatory Visit: Payer: Self-pay

## 2020-08-17 ENCOUNTER — Ambulatory Visit (INDEPENDENT_AMBULATORY_CARE_PROVIDER_SITE_OTHER): Payer: 59 | Admitting: Pulmonary Disease

## 2020-08-17 DIAGNOSIS — R0609 Other forms of dyspnea: Secondary | ICD-10-CM

## 2020-08-17 DIAGNOSIS — R0600 Dyspnea, unspecified: Secondary | ICD-10-CM

## 2020-08-17 LAB — PULMONARY FUNCTION TEST
DL/VA % pred: 98 %
DL/VA: 4.08 ml/min/mmHg/L
DLCO cor % pred: 98 %
DLCO cor: 19.84 ml/min/mmHg
DLCO unc % pred: 98 %
DLCO unc: 19.84 ml/min/mmHg
FEF 25-75 Post: 2.5 L/sec
FEF 25-75 Pre: 2.19 L/sec
FEF2575-%Change-Post: 14 %
FEF2575-%Pred-Post: 115 %
FEF2575-%Pred-Pre: 101 %
FEV1-%Change-Post: 2 %
FEV1-%Pred-Post: 91 %
FEV1-%Pred-Pre: 89 %
FEV1-Post: 2.26 L
FEV1-Pre: 2.21 L
FEV1FVC-%Change-Post: 4 %
FEV1FVC-%Pred-Pre: 103 %
FEV6-%Change-Post: -1 %
FEV6-%Pred-Post: 87 %
FEV6-%Pred-Pre: 89 %
FEV6-Post: 2.72 L
FEV6-Pre: 2.77 L
FEV6FVC-%Change-Post: 0 %
FEV6FVC-%Pred-Post: 104 %
FEV6FVC-%Pred-Pre: 103 %
FVC-%Change-Post: -2 %
FVC-%Pred-Post: 84 %
FVC-%Pred-Pre: 86 %
FVC-Post: 2.72 L
FVC-Pre: 2.78 L
Post FEV1/FVC ratio: 83 %
Post FEV6/FVC ratio: 100 %
Pre FEV1/FVC ratio: 80 %
Pre FEV6/FVC Ratio: 100 %
RV % pred: 107 %
RV: 2.27 L
TLC % pred: 100 %
TLC: 5.15 L

## 2020-08-17 NOTE — Progress Notes (Signed)
PFT done today. 

## 2020-08-19 ENCOUNTER — Ambulatory Visit (INDEPENDENT_AMBULATORY_CARE_PROVIDER_SITE_OTHER)
Admission: RE | Admit: 2020-08-19 | Discharge: 2020-08-19 | Disposition: A | Discharge status: Home / Self Care | Payer: 59 | Source: Ambulatory Visit | Attending: Pulmonary Disease | Admitting: Pulmonary Disease

## 2020-08-19 ENCOUNTER — Other Ambulatory Visit: Payer: Self-pay

## 2020-08-19 DIAGNOSIS — R911 Solitary pulmonary nodule: Secondary | ICD-10-CM | POA: Diagnosis not present

## 2020-08-19 DIAGNOSIS — R0602 Shortness of breath: Secondary | ICD-10-CM | POA: Diagnosis not present

## 2020-08-20 DIAGNOSIS — G894 Chronic pain syndrome: Secondary | ICD-10-CM | POA: Diagnosis not present

## 2020-08-20 DIAGNOSIS — M47812 Spondylosis without myelopathy or radiculopathy, cervical region: Secondary | ICD-10-CM | POA: Diagnosis not present

## 2020-08-20 DIAGNOSIS — R079 Chest pain, unspecified: Secondary | ICD-10-CM | POA: Diagnosis not present

## 2020-08-20 DIAGNOSIS — M47816 Spondylosis without myelopathy or radiculopathy, lumbar region: Secondary | ICD-10-CM | POA: Diagnosis not present

## 2020-08-24 ENCOUNTER — Telehealth: Payer: Self-pay

## 2020-08-24 ENCOUNTER — Telehealth: Payer: Self-pay | Admitting: Pulmonary Disease

## 2020-08-24 DIAGNOSIS — R911 Solitary pulmonary nodule: Secondary | ICD-10-CM

## 2020-08-24 DIAGNOSIS — R918 Other nonspecific abnormal finding of lung field: Secondary | ICD-10-CM

## 2020-08-24 DIAGNOSIS — U071 COVID-19: Secondary | ICD-10-CM

## 2020-08-24 DIAGNOSIS — R0609 Other forms of dyspnea: Secondary | ICD-10-CM

## 2020-08-24 NOTE — Telephone Encounter (Signed)
CT reviewed. Will order repeat CT scan at 6 months per radiology recommendation to follow the small nodules. Reassured that the prior nodule is essentially stable. Reviewed her PFTs which are all normal - which is good.news.

## 2020-08-24 NOTE — Telephone Encounter (Signed)
See results notes. Nothing further needed at this time.

## 2020-08-24 NOTE — Telephone Encounter (Signed)
ATC patient x1, no answer, left detailed message (DPR) to return message regarding PFT and CT results.

## 2020-08-24 NOTE — Telephone Encounter (Signed)
Called report received from [facility] Radiology on Chest CT Chest WO contrast on 08/21/20  IMPRESSION: 1. The nodule in the right lung on series 3, image 81 demonstrates a mean measurement of 4 mm today and previously. The nodule is a little more solid in appearance but the difference may be technical as today's study is superior to the previous. There is also a nodule in the left lower lobe on series 3, image 111 measuring 4 mm. This nodule was not definitely seen in August of 2020. It is possible this nodule was hidden by adjacent atelectasis on the previous study. Recommend another follow-up CT scan in 6 months to assess these 2 nodules. 2. Mild atherosclerotic change in the nonaneurysmal aorta. 3. Coronary artery disease as above.

## 2020-08-24 NOTE — Telephone Encounter (Signed)
I called and spoke with patient regarding CT and PFT results. Patient verbalized understanding, I also put in order for CT in 6 months. Informed patient that our [facility] will reach out to schedule CT. Nothing further needed.

## 2020-09-21 DIAGNOSIS — R300 Dysuria: Secondary | ICD-10-CM | POA: Diagnosis not present

## 2020-09-21 DIAGNOSIS — Z20828 Contact with and (suspected) exposure to other viral communicable diseases: Secondary | ICD-10-CM | POA: Diagnosis not present

## 2020-10-28 DIAGNOSIS — Z79891 Long term (current) use of opiate analgesic: Secondary | ICD-10-CM | POA: Diagnosis not present

## 2020-10-28 DIAGNOSIS — G894 Chronic pain syndrome: Secondary | ICD-10-CM | POA: Diagnosis not present

## 2020-10-28 DIAGNOSIS — M47816 Spondylosis without myelopathy or radiculopathy, lumbar region: Secondary | ICD-10-CM | POA: Diagnosis not present

## 2020-10-28 DIAGNOSIS — R079 Chest pain, unspecified: Secondary | ICD-10-CM | POA: Diagnosis not present

## 2020-10-28 DIAGNOSIS — M47812 Spondylosis without myelopathy or radiculopathy, cervical region: Secondary | ICD-10-CM | POA: Diagnosis not present

## 2020-12-23 DIAGNOSIS — M47812 Spondylosis without myelopathy or radiculopathy, cervical region: Secondary | ICD-10-CM | POA: Diagnosis not present

## 2020-12-23 DIAGNOSIS — G894 Chronic pain syndrome: Secondary | ICD-10-CM | POA: Diagnosis not present

## 2020-12-23 DIAGNOSIS — Z79891 Long term (current) use of opiate analgesic: Secondary | ICD-10-CM | POA: Diagnosis not present

## 2020-12-23 DIAGNOSIS — M47816 Spondylosis without myelopathy or radiculopathy, lumbar region: Secondary | ICD-10-CM | POA: Diagnosis not present

## 2020-12-30 DIAGNOSIS — L570 Actinic keratosis: Secondary | ICD-10-CM | POA: Diagnosis not present

## 2020-12-30 DIAGNOSIS — L578 Other skin changes due to chronic exposure to nonionizing radiation: Secondary | ICD-10-CM | POA: Diagnosis not present

## 2020-12-30 DIAGNOSIS — D225 Melanocytic nevi of trunk: Secondary | ICD-10-CM | POA: Diagnosis not present

## 2020-12-30 DIAGNOSIS — D485 Neoplasm of uncertain behavior of skin: Secondary | ICD-10-CM | POA: Diagnosis not present

## 2021-01-06 DIAGNOSIS — C300 Malignant neoplasm of nasal cavity: Secondary | ICD-10-CM | POA: Diagnosis not present

## 2021-01-06 MED ORDER — LOSARTAN POTASSIUM 25 MG PO TABS: ORAL_TABLET | ORAL | 0 refills | Status: DC

## 2021-01-27 DIAGNOSIS — H04123 Dry eye syndrome of bilateral lacrimal glands: Secondary | ICD-10-CM | POA: Diagnosis not present

## 2021-01-27 DIAGNOSIS — H524 Presbyopia: Secondary | ICD-10-CM | POA: Diagnosis not present

## 2021-02-08 ENCOUNTER — Other Ambulatory Visit: Payer: Self-pay | Admitting: Cardiology

## 2021-02-15 ENCOUNTER — Encounter: Payer: Self-pay | Admitting: Cardiology

## 2021-02-15 ENCOUNTER — Ambulatory Visit: Payer: Medicare HMO | Admitting: Cardiology

## 2021-02-15 ENCOUNTER — Other Ambulatory Visit: Payer: Self-pay

## 2021-02-15 VITALS — BP 110/70 | HR 58 | Ht 64.5 in | Wt 128.2 lb

## 2021-02-15 DIAGNOSIS — Z8522 Personal history of malignant neoplasm of nasal cavities, middle ear, and accessory sinuses: Secondary | ICD-10-CM | POA: Diagnosis not present

## 2021-02-15 DIAGNOSIS — R0609 Other forms of dyspnea: Secondary | ICD-10-CM

## 2021-02-15 DIAGNOSIS — I2510 Atherosclerotic heart disease of native coronary artery without angina pectoris: Secondary | ICD-10-CM | POA: Diagnosis not present

## 2021-02-15 DIAGNOSIS — I5181 Takotsubo syndrome: Secondary | ICD-10-CM

## 2021-02-15 NOTE — Progress Notes (Signed)
Cardiology Office Note:    Date:  02/15/2021   ID:  Kathryn [name], DOB February 22, 1956, MRN [id number]  PCP:  Ernestene Kiel, MD  Cardiologist:  Jenne Campus, MD    Referring MD: Ernestene Kiel, MD   Chief Complaint  Patient presents with   Follow-up         History of Present Illness:    Kathryn [name] is a 65 y.o. female  with past medical history significant for Takotsubo cardiomyopathy, 2019 she had cardiac catheterization done which showed only 50% LAD stenosis.  Her usual ejection fraction was about 50%, however recent echocardiogram repeated on June 16, 2020 showing ejection fraction of 6065%.  She comes today 2 months for follow-up.  Since I seen her last time 2 events happened first fall she tripped on some small dock and strike her head and up having injury to her scalp.  She was bleeding a lot but did not go and look for any help.  Recently also she sustained some injury to her left rib and now she is having a lot of rib pain.  She did have difficulty taking deep breath coughing twisting body laughing make pain worse. Otherwise denies having any new chest pains.  Past Medical History:  Diagnosis Date   Anxiety    Arthritis    Atypical chest pain 08/23/2018   Chronic pain 11/02/2017   Cigarette smoker 11/27/2017   Coronary artery disease 50% mid LAD and mid circumflex on cardiac catheterization 2018 11/15/2018   COVID-19 virus infection 08/30/2018   Dyspnea on exertion 08/23/2018   Heart attack ([place]) 10/2017   Infection due to 2019 novel coronavirus 08/28/2018   Formatting of this note might be different from the original. Notification of POS result Isolation since day of testing of patient and family members [facility] will call with specific instructions Conv Care their POC until the LHD contacts them Obtaining testing for symptomatic Family Members Discuss self-isolation measures Discuss in-home separation and safety measures Discuss web-based C   Mass of  right axilla 08/04/2015   Neck pain 10/11/2011   Personal history of malignant neoplasm of nasal cavities, middle ear, and accessory sinuses 11/10/2011   Pneumonia    Skin cancer (melanoma) ([place])    Stress incontinence    Takotsubo cardiomyopathy    Tobacco use    UTI (urinary tract infection)     Past Surgical History:  Procedure Laterality Date   ABDOMINAL HYSTERECTOMY     cervical cancer   back skin flap     BACK SURGERY     lumbar and cervical   hernia surgery     X2    LEFT HEART CATH AND CORONARY ANGIOGRAPHY N/A 10/30/2017   Procedure: LEFT HEART CATH AND CORONARY ANGIOGRAPHY;  Surgeon: Lorretta Harp, MD;  Location: [facility];  Service: Cardiovascular;  Laterality: N/A;   MANDIBLE SURGERY     lower jaw   MELANOMA EXCISION     L arm and L leg, 2014   muscle flap surgery     NECK SURGERY     NOSE SURGERY  2012   Lost her nose due to cancer   SKIN CANCER EXCISION     squamous cell cancer , 2012   WRIST SURGERY      Current Medications: Current Meds  Medication Sig   ALPRAZolam (XANAX) 0.25 MG tablet Take 0.25 mg by mouth at bedtime as needed for anxiety.    aspirin EC 81 MG tablet  Take 81 mg by mouth daily.   atorvastatin (LIPITOR) 80 MG tablet TAKE 1 TABLET BY MOUTH ONCE DAILY AT 6PM (Patient taking differently: Take 80 mg by mouth daily.)   calcipotriene (DOVONOX) 0.005 % cream Apply 1 application topically every morning.   estrogens-methylTEST 1.25-2.5 MG TABS per tablet Take 1 tablet by mouth daily.   fluorouracil (EFUDEX) 5 % cream Apply 1 application topically daily.   furosemide (LASIX) 20 MG tablet TAKE 1 TABLET BY MOUTH ONCE DAILY AS NEEDED. (Patient taking differently: Take 20 mg by mouth as needed for fluid.)   losartan (COZAAR) 25 MG tablet TAKE 1 TABLET BY MOUTH TWICE(2) DAILY (Patient taking differently: Take 25 mg by mouth 2 (two) times daily. TAKE 1 TABLET BY MOUTH TWICE(2) DAILY)   morphine (MS CONTIN) 30 MG 12 hr tablet Take 15 mg by mouth 2  (two) times daily.   oxybutynin (DITROPAN XL) 15 MG 24 hr tablet Take 15 mg by mouth as needed (Bowel or bladder control).   polyethylene glycol (MIRALAX / GLYCOLAX) packet Take 8.6 g by mouth as needed for mild constipation.   ranolazine (RANEXA) 500 MG 12 hr tablet Take 1 tablet (500 mg total) by mouth 2 (two) times daily.   traZODone (DESYREL) 150 MG tablet Take 150 mg by mouth at bedtime.      Allergies:   Cephalosporins   Social History   Socioeconomic History   Marital status: Married    Spouse name: Not on file   Number of children: 2   Years of education: Not on file   Highest education level: Not on file  Occupational History   Not on file  Tobacco Use   Smoking status: Some Days    Packs/day: 0.25    Years: 40.00    Pack years: 10.00    Types: Cigarettes   Smokeless tobacco: Never  Vaping Use   Vaping Use: Never used  Substance and Sexual Activity   Alcohol use: No   Drug use: No   Sexual activity: Not on file  Other Topics Concern   Not on file  Social History Narrative   Not on file   Social Determinants of Health   Financial Resource Strain: Not on file  Food Insecurity: Not on file  Transportation Needs: Not on file  Physical Activity: Not on file  Stress: Not on file  Social Connections: Not on file     Family History: The patient's family history includes Breast cancer in her mother; COPD in her father; Colon polyps in her mother; Diabetes in her father; Heart attack in her father; Heart disease in her father; Hypertension in her father; Kidney disease in her father. There is no history of Colon cancer or Esophageal cancer. ROS:   Please see the history of present illness.    All 14 point review of systems negative except as described per history of present illness  EKGs/Labs/Other Studies Reviewed:      Recent Labs: 06/11/2020: ALT 17; BUN 11; Creatinine, Ser 0.75; Hemoglobin 13.9; NT-Pro BNP 71; Platelets 298; Potassium 4.9; Sodium 142; TSH  1.240  Recent Lipid Panel    Component Value Date/Time   CHOL 138 10/30/2017 0227   TRIG 25 10/30/2017 0227   HDL 69 10/30/2017 0227   CHOLHDL 2.0 10/30/2017 0227   VLDL 5 10/30/2017 0227   LDLCALC 64 10/30/2017 0227    Physical Exam:    VS:  BP 110/70 (BP Location: Left Arm, Patient Position: Sitting)   Pulse Marland Kitchen)  58   Ht 5' 4.5" (1.638 m)   Wt 128 lb 3.2 oz (58.2 kg)   SpO2 97%   BMI 21.67 kg/m     Wt Readings from Last 3 Encounters:  02/15/21 128 lb 3.2 oz (58.2 kg)  08/12/20 137 lb 9.6 oz (62.4 kg)  07/22/20 138 lb (62.6 kg)     GEN:  Well nourished, well developed in no acute distress HEENT: Normal NECK: No JVD; No carotid bruits LYMPHATICS: No lymphadenopathy CARDIAC: RRR, no murmurs, no rubs, no gallops RESPIRATORY:  Clear to auscultation without rales, wheezing or rhonchi  ABDOMEN: Soft, non-tender, non-distended MUSCULOSKELETAL:  No edema; No deformity  SKIN: Warm and dry LOWER EXTREMITIES: no swelling NEUROLOGIC:  Alert and oriented x 3 PSYCHIATRIC:  Normal affect   ASSESSMENT:    1. Coronary artery disease involving native coronary artery of native heart without angina pectoris   2. Takotsubo cardiomyopathy   3. Dyspnea on exertion   4. Personal history of malignant neoplasm of nasal cavities, middle ear, and accessory sinuses    PLAN:    In order of problems listed above:  Coronary disease stable from that point review doing well continue present medication which include risk factors modifications.  The key of course will be quitting smoking History of Takotsubo cardiomyopathy last echocardiogram was in April of this year showed preserved left ventricle ejection fraction. Dyslipidemia I did review her K PN which show me her LDL 45 HDL 71 this is from 04/30/2020, we will continue present management. Rib injury after fall.  She did not passed out.  I told her it cannot hurt for a few weeks.  Therefore difficult to assess her right now because of  shortness of breath.  I see her back in about 6 months continue this discussion   Medication Adjustments/Labs and Tests Ordered: Current medicines are reviewed at length with the patient today.  Concerns regarding medicines are outlined above.  No orders of the defined types were placed in this encounter.  Medication changes: No orders of the defined types were placed in this encounter.   Signed, Park Liter, MD, [facility] 02/15/2021 4:35 PM    [place]

## 2021-02-15 NOTE — Patient Instructions (Signed)

## 2021-02-17 DIAGNOSIS — M47816 Spondylosis without myelopathy or radiculopathy, lumbar region: Secondary | ICD-10-CM | POA: Diagnosis not present

## 2021-02-17 DIAGNOSIS — G894 Chronic pain syndrome: Secondary | ICD-10-CM | POA: Diagnosis not present

## 2021-02-17 DIAGNOSIS — Z79891 Long term (current) use of opiate analgesic: Secondary | ICD-10-CM | POA: Diagnosis not present

## 2021-02-17 DIAGNOSIS — M47812 Spondylosis without myelopathy or radiculopathy, cervical region: Secondary | ICD-10-CM | POA: Diagnosis not present

## 2021-02-25 ENCOUNTER — Ambulatory Visit (INDEPENDENT_AMBULATORY_CARE_PROVIDER_SITE_OTHER)
Admission: RE | Admit: 2021-02-25 | Discharge: 2021-02-25 | Disposition: A | Discharge status: Home / Self Care | Payer: Medicare HMO | Source: Ambulatory Visit | Attending: Pulmonary Disease | Admitting: Pulmonary Disease

## 2021-02-25 ENCOUNTER — Other Ambulatory Visit: Payer: Self-pay

## 2021-02-25 DIAGNOSIS — R918 Other nonspecific abnormal finding of lung field: Secondary | ICD-10-CM | POA: Diagnosis not present

## 2021-02-25 DIAGNOSIS — R911 Solitary pulmonary nodule: Secondary | ICD-10-CM | POA: Diagnosis not present

## 2021-03-01 ENCOUNTER — Telehealth: Payer: Self-pay | Admitting: Pulmonary Disease

## 2021-03-01 NOTE — Telephone Encounter (Signed)
April, will you please inform Kathryn [name] the right middle lobe nodule is unchanged since 2020 and no further follow up is needed. In addition, the small nodule on the left has shrunk and no further follow up is needed. Thank you!

## 2021-03-01 NOTE — Telephone Encounter (Signed)
Please advise on call report. FYI to provider.

## 2021-03-01 NOTE — Telephone Encounter (Signed)
I do not see any signs of fracture on the right or left ribs.

## 2021-03-19 ENCOUNTER — Telehealth: Payer: Self-pay | Admitting: Cardiology

## 2021-03-19 MED ORDER — FUROSEMIDE 20 MG PO TABS: 20.0000 mg | ORAL_TABLET | ORAL | 3 refills | Status: DC | PRN

## 2021-03-19 MED ORDER — LOSARTAN POTASSIUM 25 MG PO TABS: 25.0000 mg | ORAL_TABLET | Freq: Two times a day (BID) | ORAL | 3 refills | Status: DC

## 2021-03-19 NOTE — Telephone Encounter (Signed)
° ° °*  STAT* If patient is at the pharmacy, call can be transferred to refill team.   1. Which medications need to be refilled? (please list name of each medication and dose if known) furosemide (LASIX) 20 MG tablet losartan (COZAAR) 25 MG tablet  2. Which pharmacy/location (including street and city if local pharmacy) is medication to be sent to? [place], [place]  3. Do they need a 30 day or 90 day supply? 90 days  Pt needs refill today, she is out of meds

## 2021-03-19 NOTE — Telephone Encounter (Signed)
Refills sent in per request 

## 2021-03-19 NOTE — Addendum Note (Signed)
Addended by: Resa Miner I on: 03/19/2021 10:12 AM   Modules accepted: Orders

## 2021-04-14 DIAGNOSIS — M47812 Spondylosis without myelopathy or radiculopathy, cervical region: Secondary | ICD-10-CM | POA: Diagnosis not present

## 2021-04-14 DIAGNOSIS — M47816 Spondylosis without myelopathy or radiculopathy, lumbar region: Secondary | ICD-10-CM | POA: Diagnosis not present

## 2021-04-14 DIAGNOSIS — G894 Chronic pain syndrome: Secondary | ICD-10-CM | POA: Diagnosis not present

## 2021-04-14 DIAGNOSIS — Z79891 Long term (current) use of opiate analgesic: Secondary | ICD-10-CM | POA: Diagnosis not present

## 2021-04-30 DIAGNOSIS — Z6821 Body mass index (BMI) 21.0-21.9, adult: Secondary | ICD-10-CM | POA: Diagnosis not present

## 2021-04-30 DIAGNOSIS — I10 Essential (primary) hypertension: Secondary | ICD-10-CM | POA: Diagnosis not present

## 2021-04-30 DIAGNOSIS — E785 Hyperlipidemia, unspecified: Secondary | ICD-10-CM | POA: Diagnosis not present

## 2021-04-30 DIAGNOSIS — Z79899 Other long term (current) drug therapy: Secondary | ICD-10-CM | POA: Diagnosis not present

## 2021-04-30 DIAGNOSIS — R2231 Localized swelling, mass and lump, right upper limb: Secondary | ICD-10-CM | POA: Diagnosis not present

## 2021-04-30 DIAGNOSIS — R69 Illness, unspecified: Secondary | ICD-10-CM | POA: Diagnosis not present

## 2021-04-30 DIAGNOSIS — I2510 Atherosclerotic heart disease of native coronary artery without angina pectoris: Secondary | ICD-10-CM | POA: Diagnosis not present

## 2021-05-04 ENCOUNTER — Other Ambulatory Visit: Payer: Self-pay | Admitting: Cardiology

## 2021-05-05 DIAGNOSIS — L579 Skin changes due to chronic exposure to nonionizing radiation, unspecified: Secondary | ICD-10-CM | POA: Diagnosis not present

## 2021-05-19 ENCOUNTER — Other Ambulatory Visit: Payer: Self-pay | Admitting: Cardiology

## 2021-06-03 DIAGNOSIS — R2242 Localized swelling, mass and lump, left lower limb: Secondary | ICD-10-CM | POA: Diagnosis not present

## 2021-06-03 DIAGNOSIS — M25551 Pain in right hip: Secondary | ICD-10-CM | POA: Diagnosis not present

## 2021-06-03 DIAGNOSIS — M25559 Pain in unspecified hip: Secondary | ICD-10-CM | POA: Diagnosis not present

## 2021-06-09 DIAGNOSIS — M47816 Spondylosis without myelopathy or radiculopathy, lumbar region: Secondary | ICD-10-CM | POA: Diagnosis not present

## 2021-06-09 DIAGNOSIS — M47812 Spondylosis without myelopathy or radiculopathy, cervical region: Secondary | ICD-10-CM | POA: Diagnosis not present

## 2021-06-09 DIAGNOSIS — M25551 Pain in right hip: Secondary | ICD-10-CM | POA: Diagnosis not present

## 2021-06-09 DIAGNOSIS — G894 Chronic pain syndrome: Secondary | ICD-10-CM | POA: Diagnosis not present

## 2021-07-06 DIAGNOSIS — M25551 Pain in right hip: Secondary | ICD-10-CM | POA: Diagnosis not present

## 2021-07-06 DIAGNOSIS — M79604 Pain in right leg: Secondary | ICD-10-CM | POA: Diagnosis not present

## 2021-07-06 DIAGNOSIS — R600 Localized edema: Secondary | ICD-10-CM | POA: Diagnosis not present

## 2021-07-06 DIAGNOSIS — M1611 Unilateral primary osteoarthritis, right hip: Secondary | ICD-10-CM | POA: Diagnosis not present

## 2021-07-06 DIAGNOSIS — M7061 Trochanteric bursitis, right hip: Secondary | ICD-10-CM | POA: Diagnosis not present

## 2021-08-04 DIAGNOSIS — M47812 Spondylosis without myelopathy or radiculopathy, cervical region: Secondary | ICD-10-CM | POA: Diagnosis not present

## 2021-08-04 DIAGNOSIS — M25551 Pain in right hip: Secondary | ICD-10-CM | POA: Diagnosis not present

## 2021-08-04 DIAGNOSIS — M47816 Spondylosis without myelopathy or radiculopathy, lumbar region: Secondary | ICD-10-CM | POA: Diagnosis not present

## 2021-08-04 DIAGNOSIS — G894 Chronic pain syndrome: Secondary | ICD-10-CM | POA: Diagnosis not present

## 2021-08-23 ENCOUNTER — Ambulatory Visit: Payer: Medicare HMO | Admitting: Cardiology

## 2021-09-15 DIAGNOSIS — L579 Skin changes due to chronic exposure to nonionizing radiation, unspecified: Secondary | ICD-10-CM | POA: Diagnosis not present

## 2021-09-15 DIAGNOSIS — C44321 Squamous cell carcinoma of skin of nose: Secondary | ICD-10-CM | POA: Diagnosis not present

## 2021-09-15 DIAGNOSIS — L570 Actinic keratosis: Secondary | ICD-10-CM | POA: Diagnosis not present

## 2021-09-29 DIAGNOSIS — G894 Chronic pain syndrome: Secondary | ICD-10-CM | POA: Diagnosis not present

## 2021-09-29 DIAGNOSIS — M47816 Spondylosis without myelopathy or radiculopathy, lumbar region: Secondary | ICD-10-CM | POA: Diagnosis not present

## 2021-09-29 DIAGNOSIS — M25551 Pain in right hip: Secondary | ICD-10-CM | POA: Diagnosis not present

## 2021-09-29 DIAGNOSIS — M47812 Spondylosis without myelopathy or radiculopathy, cervical region: Secondary | ICD-10-CM | POA: Diagnosis not present

## 2021-10-14 ENCOUNTER — Telehealth: Payer: Self-pay

## 2021-10-14 ENCOUNTER — Ambulatory Visit: Payer: Medicare HMO | Admitting: Cardiology

## 2021-10-14 ENCOUNTER — Encounter: Payer: Self-pay | Admitting: Cardiology

## 2021-10-14 VITALS — BP 132/70 | HR 63 | Ht 64.5 in | Wt 124.4 lb

## 2021-10-14 DIAGNOSIS — R0789 Other chest pain: Secondary | ICD-10-CM

## 2021-10-14 DIAGNOSIS — R072 Precordial pain: Secondary | ICD-10-CM | POA: Diagnosis not present

## 2021-10-14 DIAGNOSIS — I2510 Atherosclerotic heart disease of native coronary artery without angina pectoris: Secondary | ICD-10-CM | POA: Diagnosis not present

## 2021-10-14 DIAGNOSIS — F17210 Nicotine dependence, cigarettes, uncomplicated: Secondary | ICD-10-CM | POA: Diagnosis not present

## 2021-10-14 DIAGNOSIS — I5181 Takotsubo syndrome: Secondary | ICD-10-CM | POA: Diagnosis not present

## 2021-10-14 DIAGNOSIS — R0609 Other forms of dyspnea: Secondary | ICD-10-CM | POA: Diagnosis not present

## 2021-10-14 DIAGNOSIS — R69 Illness, unspecified: Secondary | ICD-10-CM | POA: Diagnosis not present

## 2021-10-14 MED ORDER — NITROGLYCERIN 0.4 MG SL SUBL: 0.4000 mg | SUBLINGUAL_TABLET | SUBLINGUAL | 6 refills | Status: DC | PRN

## 2021-10-14 MED ORDER — ISOSORBIDE MONONITRATE ER 30 MG PO TB24: 30.0000 mg | ORAL_TABLET | Freq: Every day | ORAL | 3 refills | Status: DC

## 2021-10-14 NOTE — Progress Notes (Signed)
Cardiology Office Note:    Date:  10/14/2021   ID:  Kathryn [name], DOB 09/21/1955, MRN [id number]  PCP:  Ernestene Kiel, MD  Cardiologist:  Jenne Campus, MD    Referring MD: Ernestene Kiel, MD   Chief Complaint  Patient presents with   Chest Pain    Ongoing 6 weeks     History of Present Illness:    Kathryn [name] is a 66 y.o. female  with past medical history significant for Takotsubo cardiomyopathy, 2019 she had cardiac catheterization done which showed only 50% LAD stenosis.  Her usual ejection fraction was about 50%, however recent echocardiogram repeated on June 16, 2020 showing ejection fraction of 6065%.  She comes today to my office for follow-up she described the fact that for last 6 weeks she has been having chest pain those pains happen typically every 2 or 3 days.  Usually at rest she describes tightness in the chest last for about up to 20 minutes.  She did not have nitroglycerin so she did not tried there is no shortness of breath there is no sweating associated with this sensation no dizziness.  Interestingly at the same time she is telling me that she can walk climb stairs with no difficulties she said that this is exactly the same sensation she got in 2019 she was diagnosed with Takotsubo cardiomyopathy.  Past Medical History:  Diagnosis Date   Anxiety    Arthritis    Atypical chest pain 08/23/2018   Chronic pain 11/02/2017   Cigarette smoker 11/27/2017   Coronary artery disease 50% mid LAD and mid circumflex on cardiac catheterization 2018 11/15/2018   COVID-19 virus infection 08/30/2018   Dyspnea on exertion 08/23/2018   Heart attack ([place]) 10/2017   Infection due to 2019 novel coronavirus 08/28/2018   Formatting of this note might be different from the original. Notification of POS result Isolation since day of testing of patient and family members [facility] Dept will call with specific instructions Conv Care their POC until the LHD contacts them  Obtaining testing for symptomatic Family Members Discuss self-isolation measures Discuss in-home separation and safety measures Discuss web-based C   Mass of right axilla 08/04/2015   Neck pain 10/11/2011   Personal history of malignant neoplasm of nasal cavities, middle ear, and accessory sinuses 11/10/2011   Pneumonia    Skin cancer (melanoma) ([place])    Stress incontinence    Takotsubo cardiomyopathy    Tobacco use    UTI (urinary tract infection)     Past Surgical History:  Procedure Laterality Date   ABDOMINAL HYSTERECTOMY     cervical cancer   back skin flap     BACK SURGERY     lumbar and cervical   hernia surgery     X2    LEFT HEART CATH AND CORONARY ANGIOGRAPHY N/A 10/30/2017   Procedure: LEFT HEART CATH AND CORONARY ANGIOGRAPHY;  Surgeon: Lorretta Harp, MD;  Location: [facility];  Service: Cardiovascular;  Laterality: N/A;   MANDIBLE SURGERY     lower jaw   MELANOMA EXCISION     L arm and L leg, 2014   muscle flap surgery     NECK SURGERY     NOSE SURGERY  2012   Lost her nose due to cancer   SKIN CANCER EXCISION     squamous cell cancer , 2012   WRIST SURGERY      Current Medications: Current Meds  Medication Sig   ALPRAZolam (  XANAX) 0.25 MG tablet Take 0.25 mg by mouth at bedtime as needed for anxiety.    aspirin EC 81 MG tablet Take 81 mg by mouth daily.   atorvastatin (LIPITOR) 80 MG tablet Take 1 tablet (80 mg total) by mouth daily.   calcipotriene (DOVONOX) 0.005 % cream Apply 1 application topically every morning.   estrogens-methylTEST 1.25-2.5 MG TABS per tablet Take 1 tablet by mouth daily.   fluorouracil (EFUDEX) 5 % cream Apply 1 application topically daily.   furosemide (LASIX) 20 MG tablet Take 1 tablet (20 mg total) by mouth as needed for fluid.   losartan (COZAAR) 25 MG tablet Take 1 tablet (25 mg total) by mouth 2 (two) times daily. TAKE 1 TABLET BY MOUTH TWICE(2) DAILY   morphine (MS CONTIN) 30 MG 12 hr tablet Take 15 mg by mouth 2  (two) times daily.   oxybutynin (DITROPAN XL) 15 MG 24 hr tablet Take 15 mg by mouth as needed (Bowel or bladder control).   polyethylene glycol (MIRALAX / GLYCOLAX) packet Take 8.6 g by mouth as needed for mild constipation.   ranolazine (RANEXA) 500 MG 12 hr tablet Take 1 tablet (500 mg total) by mouth 2 (two) times daily.   traZODone (DESYREL) 150 MG tablet Take 150 mg by mouth at bedtime.      Allergies:   Cephalosporins   Social History   Socioeconomic History   Marital status: Married    Spouse name: Not on file   Number of children: 2   Years of education: Not on file   Highest education level: Not on file  Occupational History   Not on file  Tobacco Use   Smoking status: Some Days    Packs/day: 0.25    Years: 40.00    Total pack years: 10.00    Types: Cigarettes   Smokeless tobacco: Never  Vaping Use   Vaping Use: Never used  Substance and Sexual Activity   Alcohol use: No   Drug use: No   Sexual activity: Not on file  Other Topics Concern   Not on file  Social History Narrative   Not on file   Social Determinants of Health   Financial Resource Strain: Not on file  Food Insecurity: Not on file  Transportation Needs: Not on file  Physical Activity: Not on file  Stress: Not on file  Social Connections: Not on file     Family History: The patient's family history includes Breast cancer in her mother; COPD in her father; Colon polyps in her mother; Diabetes in her father; Heart attack in her father; Heart disease in her father; Hypertension in her father; Kidney disease in her father. There is no history of Colon cancer or Esophageal cancer. ROS:   Please see the history of present illness.    All 14 point review of systems negative except as described per history of present illness  EKGs/Labs/Other Studies Reviewed:      Recent Labs: No results found for requested labs within last 365 days.  Recent Lipid Panel    Component Value Date/Time   CHOL 138  10/30/2017 0227   TRIG 25 10/30/2017 0227   HDL 69 10/30/2017 0227   CHOLHDL 2.0 10/30/2017 0227   VLDL 5 10/30/2017 0227   LDLCALC 64 10/30/2017 0227    Physical Exam:    VS:  BP 132/70 (BP Location: Left Arm, Patient Position: Sitting)   Pulse 63   Ht 5' 4.5" (1.638 m)   Wt 124 lb  6.4 oz (56.4 kg)   SpO2 96%   BMI 21.02 kg/m     Wt Readings from Last 3 Encounters:  10/14/21 124 lb 6.4 oz (56.4 kg)  02/15/21 128 lb 3.2 oz (58.2 kg)  08/12/20 137 lb 9.6 oz (62.4 kg)     GEN:  Well nourished, well developed in no acute distress HEENT: Normal NECK: No JVD; No carotid bruits LYMPHATICS: No lymphadenopathy CARDIAC: RRR, no murmurs, no rubs, no gallops RESPIRATORY:  Clear to auscultation without rales, wheezing or rhonchi  ABDOMEN: Soft, non-tender, non-distended MUSCULOSKELETAL:  No edema; No deformity  SKIN: Warm and dry LOWER EXTREMITIES: no swelling NEUROLOGIC:  Alert and oriented x 3 PSYCHIATRIC:  Normal affect   ASSESSMENT:    1. Coronary artery disease involving native coronary artery of native heart without angina pectoris   2. Takotsubo cardiomyopathy   3. Atypical chest pain   4. Cigarette smoker    PLAN:    In order of problems listed above:  Chest pain concerning and worrisome EKG normal she does not have chest pain today.  She describes having chest pain at rest.  However with exercise she does not get any pain.  She is taking already antiplatelet therapy in form of aspirin 81 daily.  I offered her hospitalization for quick evaluation of this suspicious symptomatology however she declined.  I was able to convince her however to have troponin I done today in the hospital ask her to wait in the waiting room and there if troponin I is normal we we will manage this as an outpatient and I told her straight if troponin is abnormal and she must be admitted to the hospital.  She agreed with this plan.  In the meantime we will continue with antiplatelets therapy, I will  put her on Imdur 30 daily I also will give her nitroglycerin.  She will be scheduled to have an echocardiogram and based on troponin I will decide if we can doing cardiac catheterization or stress testing. History of Takotsubo cardiomyopathy hemodynamically compensated stable however symptomatology is worrisome.  Echocardiogram will be done Smoking obviously huge problem. Dyslipidemia she is taking Lipitor 80 which is high intense statin last fasting lipid profile I have is from April 30 2021 with LDL of 39 HDL 79.  Good cholesterol control   Medication Adjustments/Labs and Tests Ordered: Current medicines are reviewed at length with the patient today.  Concerns regarding medicines are outlined above.  No orders of the defined types were placed in this encounter.  Medication changes: No orders of the defined types were placed in this encounter.   Signed, Park Liter, MD, [facility] 10/14/2021 10:49 AM    [place]

## 2021-10-14 NOTE — Telephone Encounter (Signed)
Detailed instructions left on the patient's answering machine. Asked to call back with any questions. S.Taiwan Talcott EMTP 

## 2021-10-14 NOTE — Patient Instructions (Signed)
Medication Instructions:  Your physician has recommended you make the following change in your medication:   START: Imdur '30mg'$  1 tablet daily by mouth   START:Nitroglygerin: Use nitroglycerin 1 tablet placed under the tongue at the first sign of chest pain or an angina attack. 1 tablet may be used every 5 minutes as needed, for up to 15 minutes. Do not take more than 3 tablets in 15 minutes. If pain persist call 911 or go to the nearest ED.    *If you need a refill on your cardiac medications before your next appointment, please call your pharmacy*   Lab Work: GO to [facility] for Troponin I Level- Wait there until results are done and called to Dr. Agustin Cree. If you have labs (blood work) drawn today and your tests are completely normal, you will receive your results only by: [place] (if you have MyChart) OR A paper copy in the mail If you have any lab test that is abnormal or we need to change your treatment, we will call you to review the results.   Testing/Procedures: Your physician has requested that you have a lexiscan myoview. For further information please visit [email or web address]. Please follow instruction sheet, as given.  The test will take approximately 3 to 4 hours to complete; you may bring reading material.  If someone comes with you to your appointment, they will need to remain in the main lobby due to limited space in the testing area.    How to prepare for your Myocardial Perfusion Test: Do not eat or drink 3 hours prior to your test, except you may have water. Do not consume products containing caffeine (regular or decaffeinated) 12 hours prior to your test. (ex: coffee, chocolate, sodas, tea). Do bring a list of your current medications with you.  If not listed below, you may take your medications as normal. Do wear comfortable clothes (no dresses or overalls) and walking shoes, tennis shoes preferred (No heels or open toe shoes are allowed). Do NOT  wear cologne, perfume, aftershave, or lotions (deodorant is allowed). If these instructions are not followed, your test will have to be rescheduled.  Your physician has requested that you have an echocardiogram. Echocardiography is a painless test that uses sound waves to create images of your heart. It provides your doctor with information about the size and shape of your heart and how well your heart's chambers and valves are working. This procedure takes approximately one hour. There are no restrictions for this procedure.   Follow-Up: At [facility], you and your health needs are our priority.  As part of our continuing mission to provide you with exceptional heart care, we have created designated Provider Care Teams.  These Care Teams include your primary Cardiologist (physician) and Advanced Practice Providers (APPs -  Physician Assistants and Nurse Practitioners) who all work together to provide you with the care you need, when you need it.  We recommend signing up for the patient portal called "MyChart".  Sign up information is provided on this After Visit Summary.  MyChart is used to connect with patients for Virtual Visits (Telemedicine).  Patients are able to view lab/test results, encounter notes, upcoming appointments, etc.  Non-urgent messages can be sent to your provider as well.   To learn more about what you can do with MyChart, go to [email or web address].    Your next appointment:   6 week(s)  The format for your next appointment:   In Person  Provider:  Jenne Campus, MD   Other Instructions Cardiac Nuclear Scan A cardiac nuclear scan is a test that is done to check the flow of blood to your heart. It is done when you are resting and when you are exercising. The test looks for problems such as: Not enough blood reaching a portion of the heart. The heart muscle not working as it should. You may need this test if: You have heart disease. You have had lab  results that are not normal. You have had heart surgery or a balloon procedure to open up blocked arteries (angioplasty). You have chest pain. You have shortness of breath. In this test, a special dye (tracer) is put into your bloodstream. The tracer will travel to your heart. A camera will then take pictures of your heart to see how the tracer moves through your heart. This test is usually done at a hospital and takes 2-4 hours. Tell a doctor about: Any allergies you have. All medicines you are taking, including vitamins, herbs, eye drops, creams, and over-the-counter medicines. Any problems you or family members have had with anesthetic medicines. Any blood disorders you have. Any surgeries you have had. Any medical conditions you have. Whether you are pregnant or may be pregnant. What are the risks? Generally, this is a safe test. However, problems may occur, such as: Serious chest pain and heart attack. This is only a risk if the stress portion of the test is done. Rapid heartbeat. A feeling of warmth in your chest. This feeling usually does not last long. Allergic reaction to the tracer. What happens before the test? Ask your doctor about changing or stopping your normal medicines. This is important. Follow instructions from your doctor about what you cannot eat or drink. Remove your jewelry on the day of the test. What happens during the test? An IV tube will be inserted into one of your veins. Your doctor will give you a small amount of tracer through the IV tube. You will wait for 20-40 minutes while the tracer moves through your bloodstream. Your heart will be monitored with an electrocardiogram (ECG). You will lie down on an exam table. Pictures of your heart will be taken for about 15-20 minutes. You may also have a stress test. For this test, one of these things may be done: You will be asked to exercise on a treadmill or a stationary bike. You will be given medicines that  will make your heart work harder. This is done if you are unable to exercise. When blood flow to your heart has peaked, a tracer will again be given through the IV tube. After 20-40 minutes, you will get back on the exam table. More pictures will be taken of your heart. Depending on the tracer that is used, more pictures may need to be taken 3-4 hours later. Your IV tube will be removed when the test is over. The test may vary among doctors and hospitals. What happens after the test? Ask your doctor: Whether you can return to your normal schedule, including diet, activities, and medicines. Whether you should drink more fluids. This will help to remove the tracer from your body. Drink enough fluid to keep your pee (urine) pale yellow. Ask your doctor, or the department that is doing the test: When will my results be ready? How will I get my results? Summary A cardiac nuclear scan is a test that is done to check the flow of blood to your heart. Tell your doctor whether  you are pregnant or may be pregnant. Before the test, ask your doctor about changing or stopping your normal medicines. This is important. Ask your doctor whether you can return to your normal activities. You may be asked to drink more fluids. This information is not intended to replace advice given to you by your health care provider. Make sure you discuss any questions you have with your health care provider. Document Revised: 06/20/2018 Document Reviewed: 08/14/2017 Elsevier Patient Education  2021 [place].    Echocardiogram An echocardiogram is a test that uses sound waves (ultrasound) to produce images of the heart. Images from an echocardiogram can provide important information about: Heart size and shape. The size and thickness and movement of your heart's walls. Heart muscle function and strength. Heart valve function or if you have stenosis. Stenosis is when the heart valves are too narrow. If blood is  flowing backward through the heart valves (regurgitation). A tumor or infectious growth around the heart valves. Areas of heart muscle that are not working well because of poor blood flow or injury from a heart attack. Aneurysm detection. An aneurysm is a weak or damaged part of an artery wall. The wall bulges out from the normal force of blood pumping through the body. Tell a health care provider about: Any allergies you have. All medicines you are taking, including vitamins, herbs, eye drops, creams, and over-the-counter medicines. Any blood disorders you have. Any surgeries you have had. Any medical conditions you have. Whether you are pregnant or may be pregnant. What are the risks? Generally, this is a safe test. However, problems may occur, including an allergic reaction to dye (contrast) that may be used during the test. What happens before the test? No specific preparation is needed. You may eat and drink normally. What happens during the test? You will take off your clothes from the waist up and put on a hospital gown. Electrodes or electrocardiogram (ECG)patches may be placed on your chest. The electrodes or patches are then connected to a device that monitors your heart rate and rhythm. You will lie down on a table for an ultrasound exam. A gel will be applied to your chest to help sound waves pass through your skin. A handheld device, called a transducer, will be pressed against your chest and moved over your heart. The transducer produces sound waves that travel to your heart and bounce back (or "echo" back) to the transducer. These sound waves will be captured in real-time and changed into images of your heart that can be viewed on a video monitor. The images will be recorded on a computer and reviewed by your health care provider. You may be asked to change positions or hold your breath for a short time. This makes it easier to get different views or better views of your heart. In  some cases, you may receive contrast through an IV in one of your veins. This can improve the quality of the pictures from your heart. The procedure may vary among health care providers and hospitals.    What can I expect after the test? You may return to your normal, everyday life, including diet, activities, and medicines, unless your health care provider tells you not to do that. Follow these instructions at home: It is up to you to get the results of your test. Ask your health care provider, or the department that is doing the test, when your results will be ready. Keep all follow-up visits. This is important.  Summary An echocardiogram is a test that uses sound waves (ultrasound) to produce images of the heart. Images from an echocardiogram can provide important information about the size and shape of your heart, heart muscle function, heart valve function, and other possible heart problems. You do not need to do anything to prepare before this test. You may eat and drink normally. After the echocardiogram is completed, you may return to your normal, everyday life, unless your health care provider tells you not to do that. This information is not intended to replace advice given to you by your health care provider. Make sure you discuss any questions you have with your health care provider. Document Revised: 10/22/2019 Document Reviewed: 10/22/2019 Elsevier Patient Education  2021 [employer].

## 2021-10-20 NOTE — Telephone Encounter (Signed)
eror

## 2021-10-21 ENCOUNTER — Ambulatory Visit (INDEPENDENT_AMBULATORY_CARE_PROVIDER_SITE_OTHER): Payer: Medicare HMO

## 2021-10-21 DIAGNOSIS — R0609 Other forms of dyspnea: Secondary | ICD-10-CM

## 2021-10-21 DIAGNOSIS — R072 Precordial pain: Secondary | ICD-10-CM

## 2021-10-21 LAB — MYOCARDIAL PERFUSION IMAGING
Angina Index: 0
Duke Treadmill Score: 8
Estimated workload: 10.1
Exercise duration (min): 8 min
Exercise duration (sec): 0 s
LV dias vol: 81 mL (ref 46–106)
LV sys vol: 29 mL
MPHR: 154 {beats}/min
Nuc Stress EF: 65 %
Peak HR: 157 {beats}/min
Percent HR: 101 %
Rest HR: 79 {beats}/min
Rest Nuclear Isotope Dose: 10.1 mCi
SDS: 5
SRS: 5
SSS: 10
ST Depression (mm): 0.5 mm
Stress Nuclear Isotope Dose: 31.6 mCi
TID: 1.02

## 2021-10-21 LAB — ECHOCARDIOGRAM COMPLETE
Area-P 1/2: 4.31 cm2
Height: 64.5 in
S' Lateral: 3.1 cm
Weight: 1984 oz

## 2021-10-21 MED ORDER — TECHNETIUM TC 99M TETROFOSMIN IV KIT
31.6000 | PACK | Freq: Once | INTRAVENOUS | Status: AC | PRN
  Administered 2021-10-21: 31.6 via INTRAVENOUS

## 2021-10-21 MED ORDER — TECHNETIUM TC 99M TETROFOSMIN IV KIT
10.1000 | PACK | Freq: Once | INTRAVENOUS | Status: AC | PRN
  Administered 2021-10-21: 10.1 via INTRAVENOUS

## 2021-10-26 ENCOUNTER — Telehealth: Payer: Self-pay

## 2021-10-26 NOTE — Telephone Encounter (Signed)
Patient notified of results.

## 2021-10-26 NOTE — Telephone Encounter (Signed)
-----   Message from Park Liter, MD sent at 10/22/2021  9:13 AM EDT ----- Stress test is normal

## 2021-10-26 NOTE — Telephone Encounter (Signed)
-----   Message from Park Liter, MD sent at 10/22/2021  9:13 AM EDT ----- Echocardiogram showed normal left ventricle ejection fraction mild mitral valve regurgitation left atrium mildly dilated.  Overall looks good

## 2021-11-29 ENCOUNTER — Encounter: Payer: Self-pay | Admitting: Cardiology

## 2021-11-29 ENCOUNTER — Ambulatory Visit: Payer: Medicare HMO | Attending: Cardiology | Admitting: Cardiology

## 2021-11-29 VITALS — BP 128/78 | HR 63 | Ht 64.5 in | Wt 125.4 lb

## 2021-11-29 DIAGNOSIS — R0609 Other forms of dyspnea: Secondary | ICD-10-CM | POA: Diagnosis not present

## 2021-11-29 DIAGNOSIS — R0789 Other chest pain: Secondary | ICD-10-CM

## 2021-11-29 DIAGNOSIS — I2510 Atherosclerotic heart disease of native coronary artery without angina pectoris: Secondary | ICD-10-CM | POA: Diagnosis not present

## 2021-11-29 DIAGNOSIS — I5181 Takotsubo syndrome: Secondary | ICD-10-CM | POA: Diagnosis not present

## 2021-11-29 MED ORDER — AMLODIPINE BESYLATE 2.5 MG PO TABS: 2.5000 mg | ORAL_TABLET | Freq: Every day | ORAL | 3 refills | Status: DC

## 2021-11-29 NOTE — Patient Instructions (Signed)
Medication Instructions:  Your physician has recommended you make the following change in your medication:   START: Amlodipine 2.'5mg'$  1 tablet daily by mouth- Sent to pharmacy on file   Lab Work: None Ordered If you have labs (blood work) drawn today and your tests are completely normal, you will receive your results only by: [place] (if you have MyChart) OR A paper copy in the mail If you have any lab test that is abnormal or we need to change your treatment, we will call you to review the results.   Testing/Procedures: None Ordered   Follow-Up: At [facility], you and your health needs are our priority.  As part of our continuing mission to provide you with exceptional heart care, we have created designated Provider Care Teams.  These Care Teams include your primary Cardiologist (physician) and Advanced Practice Providers (APPs -  Physician Assistants and Nurse Practitioners) who all work together to provide you with the care you need, when you need it.  We recommend signing up for the patient portal called "MyChart".  Sign up information is provided on this After Visit Summary.  MyChart is used to connect with patients for Virtual Visits (Telemedicine).  Patients are able to view lab/test results, encounter notes, upcoming appointments, etc.  Non-urgent messages can be sent to your provider as well.   To learn more about what you can do with MyChart, go to [email or web address].    Your next appointment:   6 week(s)  The format for your next appointment:   In Person  Provider:   Jenne Campus, MD    Other Instructions NA

## 2021-11-29 NOTE — Progress Notes (Signed)
Cardiology Office Note:    Date:  11/29/2021   ID:  Kathryn [name], DOB Oct 29, 1955, MRN [id number]  PCP:  Ernestene Kiel, MD  Cardiologist:  Jenne Campus, MD    Referring MD: Ernestene Kiel, MD   Chief Complaint  Patient presents with   Chest Pain   Dizziness    Ongoing for months     History of Present Illness:    Kathryn [name] is a 66 y.o. female with past medical history significant for Takotsubo cardiomyopathy, normalization of left ventricular ejection fraction, in 2019 she had cardiac catheterization which showed 50% LAD stenosis, normal essential hypertension, dyslipidemia, presented to my office last time about a month ago with chief complaint of chest pain.  She described pain as heaviness tightness squeezing pressure burning chest that happen at rest.  At the same time she can walk climb stairs and no trouble.  Last time I offer her hospitalization with possible left cardiac catheterization, she declined.  Therefore conservatively I put her on Imdur, however, she could not tolerate that medication.  She ended up having echocardiogram which showed preserved left ventricle ejection fraction, stress test has been performed in the form of [place] which was normal.  He is coming today to my office to follow-up.  She thinks all the symptoms are related to stress which could be a possibility.  She said she got very stressful situation at home, her mom passed in September and family fighting over the inheritance.  She described the fact that she went to trip to [place] and it was a long trip and at that time she did not have any chest pain she was relaxing everything was fine.  Past Medical History:  Diagnosis Date   Anxiety    Arthritis    Atypical chest pain 08/23/2018   Chronic pain 11/02/2017   Cigarette smoker 11/27/2017   Coronary artery disease 50% mid LAD and mid circumflex on cardiac catheterization 2018 11/15/2018   COVID-19 virus infection 08/30/2018    Dyspnea on exertion 08/23/2018   Heart attack ([place]) 10/2017   Infection due to 2019 novel coronavirus 08/28/2018   Formatting of this note might be different from the original. Notification of POS result Isolation since day of testing of patient and family members [facility] will call with specific instructions Conv Care their POC until the [facility]HD contacts them Obtaining testing for symptomatic Family Members Discuss self-isolation measures Discuss in-home separation and safety measures Discuss web-based C   Mass of right axilla 08/04/2015   Neck pain 10/11/2011   Personal history of malignant neoplasm of nasal cavities, middle ear, and accessory sinuses 11/10/2011   Pneumonia    Skin cancer (melanoma) ([place])    Stress incontinence    Takotsubo cardiomyopathy    Tobacco use    UTI (urinary tract infection)     Past Surgical History:  Procedure Laterality Date   ABDOMINAL HYSTERECTOMY     cervical cancer   back skin flap     BACK SURGERY     lumbar and cervical   hernia surgery     X2    LEFT HEART CATH AND CORONARY ANGIOGRAPHY N/A 10/30/2017   Procedure: LEFT HEART CATH AND CORONARY ANGIOGRAPHY;  Surgeon: Lorretta Harp, MD;  Location: [facility];  Service: Cardiovascular;  Laterality: N/A;   MANDIBLE SURGERY     lower jaw   MELANOMA EXCISION     L arm and L leg, 2014   muscle flap  surgery     NECK SURGERY     NOSE SURGERY  2012   Lost her nose due to cancer   SKIN CANCER EXCISION     squamous cell cancer , 2012   WRIST SURGERY      Current Medications: Current Meds  Medication Sig   ALPRAZolam (XANAX) 0.25 MG tablet Take 0.25 mg by mouth at bedtime as needed for anxiety.    aspirin EC 81 MG tablet Take 81 mg by mouth daily.   atorvastatin (LIPITOR) 80 MG tablet Take 1 tablet (80 mg total) by mouth daily.   estrogens-methylTEST 1.25-2.5 MG TABS per tablet Take 1 tablet by mouth daily.   fluorouracil (EFUDEX) 5 % cream Apply 1 application  topically as needed (pre  screening cancer).   furosemide (LASIX) 20 MG tablet Take 1 tablet (20 mg total) by mouth as needed for fluid.   isosorbide mononitrate (IMDUR) 30 MG 24 hr tablet Take 1 tablet (30 mg total) by mouth daily.   losartan (COZAAR) 25 MG tablet Take 1 tablet (25 mg total) by mouth 2 (two) times daily. TAKE 1 TABLET BY MOUTH TWICE(2) DAILY   morphine (MS CONTIN) 30 MG 12 hr tablet Take 15 mg by mouth 2 (two) times daily.   nitroGLYCERIN (NITROSTAT) 0.4 MG SL tablet Place 1 tablet (0.4 mg total) under the tongue every 5 (five) minutes as needed for chest pain.   oxybutynin (DITROPAN XL) 15 MG 24 hr tablet Take 15 mg by mouth as needed (Bowel or bladder control).   polyethylene glycol (MIRALAX / GLYCOLAX) packet Take 8.6 g by mouth as needed for mild constipation.   ranolazine (RANEXA) 500 MG 12 hr tablet Take 1 tablet (500 mg total) by mouth 2 (two) times daily.   traZODone (DESYREL) 150 MG tablet Take 150 mg by mouth at bedtime.      Allergies:   Cephalosporins   Social History   Socioeconomic History   Marital status: Married    Spouse name: Not on file   Number of children: 2   Years of education: Not on file   Highest education level: Not on file  Occupational History   Not on file  Tobacco Use   Smoking status: Some Days    Packs/day: 0.25    Years: 40.00    Total pack years: 10.00    Types: Cigarettes   Smokeless tobacco: Never  Vaping Use   Vaping Use: Never used  Substance and Sexual Activity   Alcohol use: No   Drug use: No   Sexual activity: Not on file  Other Topics Concern   Not on file  Social History Narrative   Not on file   Social Determinants of Health   Financial Resource Strain: Not on file  Food Insecurity: Not on file  Transportation Needs: Not on file  Physical Activity: Not on file  Stress: Not on file  Social Connections: Not on file     Family History: The patient's family history includes Breast cancer in her mother; COPD in her father; Colon  polyps in her mother; Diabetes in her father; Heart attack in her father; Heart disease in her father; Hypertension in her father; Kidney disease in her father. There is no history of Colon cancer or Esophageal cancer. ROS:   Please see the history of present illness.    All 14 point review of systems negative except as described per history of present illness  EKGs/Labs/Other Studies Reviewed:  Recent Labs: No results found for requested labs within last 365 days.  Recent Lipid Panel    Component Value Date/Time   CHOL 138 10/30/2017 0227   TRIG 25 10/30/2017 0227   HDL 69 10/30/2017 0227   CHOLHDL 2.0 10/30/2017 0227   VLDL 5 10/30/2017 0227   LDLCALC 64 10/30/2017 0227    Physical Exam:    VS:  BP 128/78 (BP Location: Left Arm, Patient Position: Sitting)   Pulse 63   Ht 5' 4.5" (1.638 m)   Wt 125 lb 6.4 oz (56.9 kg)   SpO2 99%   BMI 21.19 kg/m     Wt Readings from Last 3 Encounters:  11/29/21 125 lb 6.4 oz (56.9 kg)  10/21/21 124 lb (56.2 kg)  10/14/21 124 lb 6.4 oz (56.4 kg)     GEN:  Well nourished, well developed in no acute distress HEENT: Normal NECK: No JVD; No carotid bruits LYMPHATICS: No lymphadenopathy CARDIAC: RRR, no murmurs, no rubs, no gallops RESPIRATORY:  Clear to auscultation without rales, wheezing or rhonchi  ABDOMEN: Soft, non-tender, non-distended MUSCULOSKELETAL:  No edema; No deformity  SKIN: Warm and dry LOWER EXTREMITIES: no swelling NEUROLOGIC:  Alert and oriented x 3 PSYCHIATRIC:  Normal affect   ASSESSMENT:    1. Coronary artery disease involving native coronary artery of native heart without angina pectoris   2. Takotsubo cardiomyopathy   3. Atypical chest pain   4. Dyspnea on exertion    PLAN:    In order of problems listed above:  Chest pain with some worrisome characteristics however atypical happening at rest never with exercise.  Stress test negative troponin negative, EKG no new changes.  I try Imdur however she  was unable to tolerate it.  Today I will ask her to add amlodipine 2.5 mg daily to her medical regiment, I also asked her to try nitroglycerin on an as-needed basis to see if it helps to have to better understanding what is going on she does have significant stomach problem ask her also to take Maalox and Mylanta for the pain to see if it helps. History of Takotsubo cardiomyopathy: Normalization last echocardiogram showed normal ejection fraction. Dyspnea on exertion stable Dyslipidemia I did review K PN which show her LDL 39 HDL 79 excellent cholesterol profile we will continue present management.  Her EKG today show sinus rhythm no ST segment changes, she is not on beta-blocker because of bradycardia   Medication Adjustments/Labs and Tests Ordered: Current medicines are reviewed at length with the patient today.  Concerns regarding medicines are outlined above.  No orders of the defined types were placed in this encounter.  Medication changes: No orders of the defined types were placed in this encounter.   Signed, Park Liter, MD, [facility] 11/29/2021 11:45 AM    [place]

## 2021-11-30 DIAGNOSIS — Z79899 Other long term (current) drug therapy: Secondary | ICD-10-CM | POA: Diagnosis not present

## 2021-11-30 DIAGNOSIS — N390 Urinary tract infection, site not specified: Secondary | ICD-10-CM | POA: Diagnosis not present

## 2021-11-30 DIAGNOSIS — R5383 Other fatigue: Secondary | ICD-10-CM | POA: Diagnosis not present

## 2021-11-30 DIAGNOSIS — E785 Hyperlipidemia, unspecified: Secondary | ICD-10-CM | POA: Diagnosis not present

## 2021-11-30 DIAGNOSIS — I10 Essential (primary) hypertension: Secondary | ICD-10-CM | POA: Diagnosis not present

## 2021-11-30 DIAGNOSIS — I700 Atherosclerosis of aorta: Secondary | ICD-10-CM | POA: Diagnosis not present

## 2021-11-30 DIAGNOSIS — M791 Myalgia, unspecified site: Secondary | ICD-10-CM | POA: Diagnosis not present

## 2021-12-01 ENCOUNTER — Other Ambulatory Visit (HOSPITAL_COMMUNITY): Payer: Self-pay

## 2021-12-01 DIAGNOSIS — G894 Chronic pain syndrome: Secondary | ICD-10-CM | POA: Diagnosis not present

## 2021-12-01 DIAGNOSIS — M47816 Spondylosis without myelopathy or radiculopathy, lumbar region: Secondary | ICD-10-CM | POA: Diagnosis not present

## 2021-12-01 DIAGNOSIS — M25551 Pain in right hip: Secondary | ICD-10-CM | POA: Diagnosis not present

## 2021-12-01 DIAGNOSIS — M47812 Spondylosis without myelopathy or radiculopathy, cervical region: Secondary | ICD-10-CM | POA: Diagnosis not present

## 2021-12-01 MED ORDER — OXYCODONE HCL 10 MG PO TABS
ORAL_TABLET | ORAL | 0 refills | Status: DC
  Filled 2021-12-01: qty 180, 30d supply, fill #0

## 2021-12-27 DIAGNOSIS — G894 Chronic pain syndrome: Secondary | ICD-10-CM | POA: Diagnosis not present

## 2021-12-27 DIAGNOSIS — M47812 Spondylosis without myelopathy or radiculopathy, cervical region: Secondary | ICD-10-CM | POA: Diagnosis not present

## 2021-12-27 DIAGNOSIS — M47816 Spondylosis without myelopathy or radiculopathy, lumbar region: Secondary | ICD-10-CM | POA: Diagnosis not present

## 2021-12-27 DIAGNOSIS — M25551 Pain in right hip: Secondary | ICD-10-CM | POA: Diagnosis not present

## 2022-01-11 ENCOUNTER — Encounter: Payer: Self-pay | Admitting: Cardiology

## 2022-01-11 ENCOUNTER — Ambulatory Visit: Payer: Medicare HMO | Attending: Cardiology | Admitting: Cardiology

## 2022-01-11 VITALS — BP 128/80 | HR 80 | Ht 64.5 in | Wt 128.4 lb

## 2022-01-11 DIAGNOSIS — I5181 Takotsubo syndrome: Secondary | ICD-10-CM

## 2022-01-11 DIAGNOSIS — I2510 Atherosclerotic heart disease of native coronary artery without angina pectoris: Secondary | ICD-10-CM

## 2022-01-11 DIAGNOSIS — R0789 Other chest pain: Secondary | ICD-10-CM | POA: Diagnosis not present

## 2022-01-11 MED ORDER — AMLODIPINE BESYLATE 5 MG PO TABS: 2.5000 mg | ORAL_TABLET | Freq: Every day | ORAL | 3 refills | Status: DC

## 2022-01-11 NOTE — Progress Notes (Signed)
Cardiology Office Note:    Date:  01/11/2022   ID:  DAILYNN Kathryn [name], DOB 08-31-1955, MRN [id number]  PCP:  Ernestene Kiel, MD  Cardiologist:  Jenne Campus, MD    Referring MD: Ernestene Kiel, MD   Chief Complaint  Patient presents with   Medication Management    Nitroglycerin resolves chest pain    History of Present Illness:    Kathryn [name] is a 66 y.o. female with past medical history significant for Takotsubo cardiomyopathy.  In 2019 she ended up having cardiac catheterization which showed 50% stenosis of the LAD.  Also essential hypertension, dyslipidemia.  She comes today to my office for regular follow-up she is doing complaining of having chest pain however she said that the pain typically happen at rest happen with stressful situation as well.  Does not happen with exercise is walking climbing stairs.  We tried different approaches to try nitroglycerin which seems to be relieving her pain but also complained of having headache in the morning may be headache is because of Imdur.  Past Medical History:  Diagnosis Date   Anxiety    Arthritis    Atypical chest pain 08/23/2018   Chronic pain 11/02/2017   Cigarette smoker 11/27/2017   Coronary artery disease 50% mid LAD and mid circumflex on cardiac catheterization 2018 11/15/2018   COVID-19 virus infection 08/30/2018   Dyspnea on exertion 08/23/2018   Heart attack ([place]) 10/2017   Infection due to 2019 novel coronavirus 08/28/2018   Formatting of this note might be different from the original. Notification of POS result Isolation since day of testing of patient and family members [facility] will call with specific instructions Conv Care their POC until the LHD contacts them Obtaining testing for symptomatic Family Members Discuss self-isolation measures Discuss in-home separation and safety measures Discuss web-based C   Mass of right axilla 08/04/2015   Neck pain 10/11/2011   Personal history of malignant neoplasm  of nasal cavities, middle ear, and accessory sinuses 11/10/2011   Pneumonia    Skin cancer (melanoma) ([place])    Stress incontinence    Takotsubo cardiomyopathy    Tobacco use    UTI (urinary tract infection)     Past Surgical History:  Procedure Laterality Date   ABDOMINAL HYSTERECTOMY     cervical cancer   back skin flap     BACK SURGERY     lumbar and cervical   hernia surgery     X2    LEFT HEART CATH AND CORONARY ANGIOGRAPHY N/A 10/30/2017   Procedure: LEFT HEART CATH AND CORONARY ANGIOGRAPHY;  Surgeon: Lorretta Harp, MD;  Location: [facility];  Service: Cardiovascular;  Laterality: N/A;   MANDIBLE SURGERY     lower jaw   MELANOMA EXCISION     L arm and L leg, 2014   muscle flap surgery     NECK SURGERY     NOSE SURGERY  2012   Lost her nose due to cancer   SKIN CANCER EXCISION     squamous cell cancer , 2012   WRIST SURGERY      Current Medications: Current Meds  Medication Sig   ALPRAZolam (XANAX) 0.25 MG tablet Take 0.25 mg by mouth at bedtime as needed for anxiety.    amLODipine (NORVASC) 2.5 MG tablet Take 1 tablet (2.5 mg total) by mouth daily.   aspirin EC 81 MG tablet Take 81 mg by mouth daily.   atorvastatin (LIPITOR) 80 MG tablet Take  1 tablet (80 mg total) by mouth daily.   estrogens-methylTEST 1.25-2.5 MG TABS per tablet Take 1 tablet by mouth daily.   fluorouracil (EFUDEX) 5 % cream Apply 1 application  topically as needed (pre screening cancer).   furosemide (LASIX) 20 MG tablet Take 1 tablet (20 mg total) by mouth as needed for fluid.   isosorbide mononitrate (IMDUR) 30 MG 24 hr tablet Take 1 tablet (30 mg total) by mouth daily.   losartan (COZAAR) 25 MG tablet Take 1 tablet (25 mg total) by mouth 2 (two) times daily. TAKE 1 TABLET BY MOUTH TWICE(2) DAILY   meloxicam (MOBIC) 15 MG tablet Take 15 mg by mouth daily.   morphine (MS CONTIN) 30 MG 12 hr tablet Take 15 mg by mouth 2 (two) times daily.   nitroGLYCERIN (NITROSTAT) 0.4 MG SL tablet  Place 1 tablet (0.4 mg total) under the tongue every 5 (five) minutes as needed for chest pain.   oxybutynin (DITROPAN XL) 15 MG 24 hr tablet Take 15 mg by mouth as needed (Bowel or bladder control).   Oxycodone HCl 10 MG TABS Take 1 tablet by mouth every four hours as needed for pain stop MS Contin. (Patient taking differently: Take 1 tablet by mouth as needed (pain).)   polyethylene glycol (MIRALAX / GLYCOLAX) packet Take 8.6 g by mouth as needed for mild constipation.   ranolazine (RANEXA) 500 MG 12 hr tablet Take 1 tablet (500 mg total) by mouth 2 (two) times daily.   traZODone (DESYREL) 150 MG tablet Take 150 mg by mouth at bedtime.      Allergies:   Cephalosporins   Social History   Socioeconomic History   Marital status: Married    Spouse name: Not on file   Number of children: 2   Years of education: Not on file   Highest education level: Not on file  Occupational History   Not on file  Tobacco Use   Smoking status: Some Days    Packs/day: 0.25    Years: 40.00    Total pack years: 10.00    Types: Cigarettes   Smokeless tobacco: Never  Vaping Use   Vaping Use: Never used  Substance and Sexual Activity   Alcohol use: No   Drug use: No   Sexual activity: Not on file  Other Topics Concern   Not on file  Social History Narrative   Not on file   Social Determinants of Health   Financial Resource Strain: Not on file  Food Insecurity: Not on file  Transportation Needs: Not on file  Physical Activity: Not on file  Stress: Not on file  Social Connections: Not on file     Family History: The patient's family history includes Breast cancer in her mother; COPD in her father; Colon polyps in her mother; Diabetes in her father; Heart attack in her father; Heart disease in her father; Hypertension in her father; Kidney disease in her father. There is no history of Colon cancer or Esophageal cancer. ROS:   Please see the history of present illness.    All 14 point review of  systems negative except as described per history of present illness  EKGs/Labs/Other Studies Reviewed:      Recent Labs: No results found for requested labs within last 365 days.  Recent Lipid Panel    Component Value Date/Time   CHOL 138 10/30/2017 0227   TRIG 25 10/30/2017 0227   HDL 69 10/30/2017 0227   CHOLHDL 2.0 10/30/2017 0227  VLDL 5 10/30/2017 0227   LDLCALC 64 10/30/2017 0227    Physical Exam:    VS:  BP 128/80 (BP Location: Left Arm, Patient Position: Sitting)   Pulse 80   Ht 5' 4.5" (1.638 m)   Wt 128 lb 6.4 oz (58.2 kg)   SpO2 97%   BMI 21.70 kg/m     Wt Readings from Last 3 Encounters:  01/11/22 128 lb 6.4 oz (58.2 kg)  11/29/21 125 lb 6.4 oz (56.9 kg)  10/21/21 124 lb (56.2 kg)     GEN:  Well nourished, well developed in no acute distress HEENT: Normal NECK: No JVD; No carotid bruits LYMPHATICS: No lymphadenopathy CARDIAC: RRR, no murmurs, no rubs, no gallops RESPIRATORY:  Clear to auscultation without rales, wheezing or rhonchi  ABDOMEN: Soft, non-tender, non-distended MUSCULOSKELETAL:  No edema; No deformity  SKIN: Warm and dry LOWER EXTREMITIES: no swelling NEUROLOGIC:  Alert and oriented x 3 PSYCHIATRIC:  Normal affect   ASSESSMENT:    1. Coronary artery disease involving native coronary artery of native heart without angina pectoris   2. Takotsubo cardiomyopathy   3. Atypical chest pain    PLAN:    In order of problems listed above:  Chest pain which is somewhat atypical but she does have known 50% LAD disease.  Plan is to continue watching this situation she is reluctant to pursue cardiac catheterization coronary CT angio at that stage.  I will increase dose of amlodipine to 5 mg daily I will not Imdur since she does have headache and more about potentially related to this.  She complain of having a lot of stress which is obviously contributing.  Ask her to start taking Maalox Mylanta and also use nitroglycerin on as-needed  basis History Takotsubo: Last echocardiogram showed preserved left ventricle ejection fraction we will continue present management. Dyslipidemia she is taking Lipitor 80 which I will continue I did review K PN which show me LDL 35 HDL 87 we will continue present management   Medication Adjustments/Labs and Tests Ordered: Current medicines are reviewed at length with the patient today.  Concerns regarding medicines are outlined above.  No orders of the defined types were placed in this encounter.  Medication changes: No orders of the defined types were placed in this encounter.   Signed, Park Liter, MD, [facility] 01/11/2022 1:13 PM    [place]

## 2022-01-11 NOTE — Patient Instructions (Signed)
Medication Instructions:  Your physician has recommended you make the following change in your medication:   Increase your Amlodipine to 5 mg daily.  *If you need a refill on your cardiac medications before your next appointment, please call your pharmacy*   Lab Work: None ordered If you have labs (blood work) drawn today and your tests are completely normal, you will receive your results only by: [place] (if you have MyChart) OR A paper copy in the mail If you have any lab test that is abnormal or we need to change your treatment, we will call you to review the results.   Testing/Procedures: None ordered   Follow-Up: At [facility], you and your health needs are our priority.  As part of our continuing mission to provide you with exceptional heart care, we have created designated Provider Care Teams.  These Care Teams include your primary Cardiologist (physician) and Advanced Practice Providers (APPs -  Physician Assistants and Nurse Practitioners) who all work together to provide you with the care you need, when you need it.  We recommend signing up for the patient portal called "MyChart".  Sign up information is provided on this After Visit Summary.  MyChart is used to connect with patients for Virtual Visits (Telemedicine).  Patients are able to view lab/test results, encounter notes, upcoming appointments, etc.  Non-urgent messages can be sent to your provider as well.   To learn more about what you can do with MyChart, go to [email or web address].    Your next appointment:   6 week(s)  The format for your next appointment:   In Person  Provider:   Jenne Campus, MD   Other Instructions NA

## 2022-01-31 ENCOUNTER — Other Ambulatory Visit: Payer: Self-pay

## 2022-01-31 MED ORDER — RANOLAZINE ER 500 MG PO TB12: 500.0000 mg | ORAL_TABLET | Freq: Two times a day (BID) | ORAL | 2 refills | Status: DC

## 2022-02-01 ENCOUNTER — Other Ambulatory Visit: Payer: Self-pay | Admitting: Cardiology

## 2022-02-14 DIAGNOSIS — H04123 Dry eye syndrome of bilateral lacrimal glands: Secondary | ICD-10-CM | POA: Diagnosis not present

## 2022-02-14 DIAGNOSIS — H524 Presbyopia: Secondary | ICD-10-CM | POA: Diagnosis not present

## 2022-02-16 DIAGNOSIS — L579 Skin changes due to chronic exposure to nonionizing radiation, unspecified: Secondary | ICD-10-CM | POA: Diagnosis not present

## 2022-02-16 DIAGNOSIS — L570 Actinic keratosis: Secondary | ICD-10-CM | POA: Diagnosis not present

## 2022-02-16 DIAGNOSIS — L988 Other specified disorders of the skin and subcutaneous tissue: Secondary | ICD-10-CM | POA: Diagnosis not present

## 2022-02-21 DIAGNOSIS — M47812 Spondylosis without myelopathy or radiculopathy, cervical region: Secondary | ICD-10-CM | POA: Diagnosis not present

## 2022-02-21 DIAGNOSIS — M47816 Spondylosis without myelopathy or radiculopathy, lumbar region: Secondary | ICD-10-CM | POA: Diagnosis not present

## 2022-02-21 DIAGNOSIS — M25551 Pain in right hip: Secondary | ICD-10-CM | POA: Diagnosis not present

## 2022-02-21 DIAGNOSIS — G894 Chronic pain syndrome: Secondary | ICD-10-CM | POA: Diagnosis not present

## 2022-02-25 ENCOUNTER — Ambulatory Visit: Payer: Medicare HMO | Attending: Cardiology | Admitting: Cardiology

## 2022-02-25 ENCOUNTER — Encounter: Payer: Self-pay | Admitting: Cardiology

## 2022-02-25 VITALS — BP 120/80 | HR 66 | Ht 64.5 in | Wt 124.0 lb

## 2022-02-25 DIAGNOSIS — R079 Chest pain, unspecified: Secondary | ICD-10-CM

## 2022-02-25 DIAGNOSIS — Z720 Tobacco use: Secondary | ICD-10-CM | POA: Diagnosis not present

## 2022-02-25 DIAGNOSIS — I5181 Takotsubo syndrome: Secondary | ICD-10-CM | POA: Diagnosis not present

## 2022-02-25 DIAGNOSIS — I2510 Atherosclerotic heart disease of native coronary artery without angina pectoris: Secondary | ICD-10-CM | POA: Diagnosis not present

## 2022-02-25 MED ORDER — METOPROLOL TARTRATE 100 MG PO TABS: ORAL_TABLET | ORAL | 0 refills | Status: DC

## 2022-02-25 NOTE — Patient Instructions (Signed)
Medication Instructions:   TAKE: Metoprolol '100mg'$  1 tablet 2 hours prior to CT Scan   HOLD: Lasix day of CT Scan  *If you need a refill on your cardiac medications before your next appointment, please call your pharmacy*   Lab Work: Your physician recommends that you return for lab work in: 1 week prior to CT Scan You need to have labs done when you are fasting.  You can come Monday through Friday 8:30 am to 12:00 pm and 1:15 to 4:30. You do not need to make an appointment as the order has already been placed. The labs you are going to have done are BMET   Testing/Procedures: Your physician has requested that you have cardiac CT. Cardiac computed tomography (CT) is a painless test that uses an x-ray machine to take clear, detailed pictures of your heart. For further information please visit [email or web address]. Please follow instruction sheet as given.    Your Cardiac CT will be scheduled at:   [facility] located off [facility] at the hospital.  Please arrive 30 minutes prior to your appointment time.  You can use the FREE valet parking offered at entrance to outpatient center (encouraged to control the heart rate for the test)   Please follow these instructions carefully (unless otherwise directed):    On the Night Before the Test: Be sure to Drink plenty of water. Do not consume any caffeinated/decaffeinated beverages or chocolate 12 hours prior to your test. Do not take any antihistamines 12 hours prior to your test.   On the Day of the Test: Drink plenty of water until 1 hour prior to the test. Do not eat any food 4 hours prior to the test. No smoking 4 hours prior to test. You may take your regular medications prior to the test.  Take metoprolol (Lopressor) two hours prior to test. HOLD Furosemide/Hydrochlorothiazide morning of the test. FEMALES- please wear underwire-free bra if available, avoid dresses & tight clothing. Wear plain shirt no  beads, sparkles, rhinestones, metal or heavy embroidery.  After the Test: Drink plenty of water. After receiving IV contrast, you may experience a mild flushed feeling. This is normal. On occasion, you may experience a mild rash up to 24 hours after the test. This is not dangerous. If this occurs, you can take Benadryl 25 mg and increase your fluid intake. If you experience trouble breathing, this can be serious. If it is severe call 911 IMMEDIATELY. If it is mild, please call our office. If you take any of these medications: Glipizide/Metformin, Avandament, Glucavance, please do not take 48 hours after completing test unless otherwise instructed.  We will call to schedule your test 2-4 weeks out understanding that some insurance companies will need an authorization prior to the service being performed.      Follow-Up: At [facility], you and your health needs are our priority.  As part of our continuing mission to provide you with exceptional heart care, we have created designated Provider Care Teams.  These Care Teams include your primary Cardiologist (physician) and Advanced Practice Providers (APPs -  Physician Assistants and Nurse Practitioners) who all work together to provide you with the care you need, when you need it.  We recommend signing up for the patient portal called "MyChart".  Sign up information is provided on this After Visit Summary.  MyChart is used to connect with patients for Virtual Visits (Telemedicine).  Patients are able to view lab/test results, encounter notes, upcoming appointments, etc.  Non-urgent messages can be sent to your provider as well.   To learn more about what you can do with MyChart, go to [email or web address].    Your next appointment:   3 month(s)  The format for your next appointment:   In Person  Provider:   Jenne Campus, MD    Other Instructions Cardiac CT Angiogram A cardiac CT angiogram is a procedure to look at the  heart and the area around the heart. It may be done to help find the cause of chest pains or other symptoms of heart disease. During this procedure, a substance called contrast dye is injected into the blood vessels in the area to be checked. A large X-ray machine, called a CT scanner, then takes detailed pictures of the heart and the surrounding area. The procedure is also sometimes called a coronary CT angiogram, coronary artery scanning, or CTA. A cardiac CT angiogram allows the health care provider to see how well blood is flowing to and from the heart. The health care provider will be able to see if there are any problems, such as: Blockage or narrowing of the coronary arteries in the heart. Fluid around the heart. Signs of weakness or disease in the muscles, valves, and tissues of the heart. Tell a health care provider about: Any allergies you have. This is especially important if you have had a previous allergic reaction to contrast dye. All medicines you are taking, including vitamins, herbs, eye drops, creams, and over-the-counter medicines. Any blood disorders you have. Any surgeries you have had. Any medical conditions you have. Whether you are pregnant or may be pregnant. Any anxiety disorders, chronic pain, or other conditions you have that may increase your stress or prevent you from lying still. What are the risks? Generally, this is a safe procedure. However, problems may occur, including: Bleeding. Infection. Allergic reactions to medicines or dyes. Damage to other structures or organs. Kidney damage from the contrast dye that is used. Increased risk of cancer from radiation exposure. This risk is low. Talk with your health care provider about: The risks and benefits of testing. How you can receive the lowest dose of radiation. What happens before the procedure? Wear comfortable clothing and remove any jewelry, glasses, dentures, and hearing aids. Follow instructions from  your health care provider about eating and drinking. This may include: For 12 hours before the procedure -- avoid caffeine. This includes tea, coffee, soda, energy drinks, and diet pills. Drink plenty of water or other fluids that do not have caffeine in them. Being well hydrated can prevent complications. For 4-6 hours before the procedure -- stop eating and drinking. The contrast dye can cause nausea, but this is less likely if your stomach is empty. Ask your health care provider about changing or stopping your regular medicines. This is especially important if you are taking diabetes medicines, blood thinners, or medicines to treat problems with erections (erectile dysfunction). What happens during the procedure?  Hair on your chest may need to be removed so that small sticky patches called electrodes can be placed on your chest. These will transmit information that helps to monitor your heart during the procedure. An IV will be inserted into one of your veins. You might be given a medicine to control your heart rate during the procedure. This will help to ensure that good images are obtained. You will be asked to lie on an exam table. This table will slide in and out of the CT machine during the  procedure. Contrast dye will be injected into the IV. You might feel warm, or you may get a metallic taste in your mouth. You will be given a medicine called nitroglycerin. This will relax or dilate the arteries in your heart. The table that you are lying on will move into the CT machine tunnel for the scan. The person running the machine will give you instructions while the scans are being done. You may be asked to: Keep your arms above your head. Hold your breath. Stay very still, even if the table is moving. When the scanning is complete, you will be moved out of the machine. The IV will be removed. The procedure may vary among health care providers and hospitals. What can I expect after the  procedure? After your procedure, it is common to have: A metallic taste in your mouth from the contrast dye. A feeling of warmth. A headache from the nitroglycerin. Follow these instructions at home: Take over-the-counter and prescription medicines only as told by your health care provider. If you are told, drink enough fluid to keep your urine pale yellow. This will help to flush the contrast dye out of your body. Most people can return to their normal activities right after the procedure. Ask your health care provider what activities are safe for you. It is up to you to get the results of your procedure. Ask your health care provider, or the department that is doing the procedure, when your results will be ready. Keep all follow-up visits as told by your health care provider. This is important. Contact a health care provider if: You have any symptoms of allergy to the contrast dye. These include: Shortness of breath. Rash or hives. A racing heartbeat. Summary A cardiac CT angiogram is a procedure to look at the heart and the area around the heart. It may be done to help find the cause of chest pains or other symptoms of heart disease. During this procedure, a large X-ray machine, called a CT scanner, takes detailed pictures of the heart and the surrounding area after a contrast dye has been injected into blood vessels in the area. Ask your health care provider about changing or stopping your regular medicines before the procedure. This is especially important if you are taking diabetes medicines, blood thinners, or medicines to treat erectile dysfunction. If you are told, drink enough fluid to keep your urine pale yellow. This will help to flush the contrast dye out of your body. This information is not intended to replace advice given to you by your health care provider. Make sure you discuss any questions you have with your health care provider. Document Revised: 06/17/2021 Document  Reviewed: 10/24/2018 Elsevier Patient Education  [place]

## 2022-02-25 NOTE — Progress Notes (Unsigned)
Cardiology Office Note:    Date:  02/25/2022   ID:  Kathryn [name], DOB 1955/07/02, MRN [id number]  PCP:  Ernestene Kiel, MD  Cardiologist:  Jenne Campus, MD    Referring MD: Ernestene Kiel, MD   Chief Complaint  Patient presents with   Follow-up    History of Present Illness:    Kathryn [name] is a 66 y.o. female past medical history significant for Takotsubo cardiomyopathy diagnosed in 2019, cardiac catheterization at that time showed up to 50% stenosis of left LAD.  Additional problem include essential hypertension, dyslipidemia.  She still complain having some chest pain.  Last time increase dose of amlodipine hoping that she may have vasospastic WBT relief with this medication but she still complaining having pain interestingly pain typically happening when she is sitting.  However she reports to get short of breath tired and exhausted while walking.  She try nitroglycerin and nitroglycerin helped with the pain.  Past Medical History:  Diagnosis Date   Anxiety    Arthritis    Atypical chest pain 08/23/2018   Chronic pain 11/02/2017   Cigarette smoker 11/27/2017   Coronary artery disease 50% mid LAD and mid circumflex on cardiac catheterization 2018 11/15/2018   COVID-19 virus infection 08/30/2018   Dyspnea on exertion 08/23/2018   Heart attack ([place]) 10/2017   Infection due to 2019 novel coronavirus 08/28/2018   Formatting of this note might be different from the original. Notification of POS result Isolation since day of testing of patient and family members [place] Dept will call with specific instructions Conv Care their POC until the LHD contacts them Obtaining testing for symptomatic Family Members Discuss self-isolation measures Discuss in-home separation and safety measures Discuss web-based C   Mass of right axilla 08/04/2015   Neck pain 10/11/2011   Personal history of malignant neoplasm of nasal cavities, middle ear, and accessory sinuses 11/10/2011    Pneumonia    Skin cancer (melanoma) ([place])    Stress incontinence    Takotsubo cardiomyopathy    Tobacco use    UTI (urinary tract infection)     Past Surgical History:  Procedure Laterality Date   ABDOMINAL HYSTERECTOMY     cervical cancer   back skin flap     BACK SURGERY     lumbar and cervical   hernia surgery     X2    LEFT HEART CATH AND CORONARY ANGIOGRAPHY N/A 10/30/2017   Procedure: LEFT HEART CATH AND CORONARY ANGIOGRAPHY;  Surgeon: Lorretta Harp, MD;  Location: [facility];  Service: Cardiovascular;  Laterality: N/A;   MANDIBLE SURGERY     lower jaw   MELANOMA EXCISION     L arm and L leg, 2014   muscle flap surgery     NECK SURGERY     NOSE SURGERY  2012   Lost her nose due to cancer   SKIN CANCER EXCISION     squamous cell cancer , 2012   WRIST SURGERY      Current Medications: Current Meds  Medication Sig   ALPRAZolam (XANAX) 0.25 MG tablet Take 0.25 mg by mouth at bedtime as needed for anxiety.    amLODipine (NORVASC) 5 MG tablet Take 0.5 tablets (2.5 mg total) by mouth daily.   aspirin EC 81 MG tablet Take 81 mg by mouth daily.   atorvastatin (LIPITOR) 80 MG tablet Take 1 tablet (80 mg total) by mouth daily.   estradiol (ESTRACE) 1 MG tablet Take 1.5 mg  by mouth daily.   fluorouracil (EFUDEX) 5 % cream Apply 1 application  topically as needed (pre screening cancer).   furosemide (LASIX) 20 MG tablet TAKE 1 TABLET (20 MG TOTAL) BY MOUTH AS NEEDED FOR FLUID.   isosorbide mononitrate (IMDUR) 30 MG 24 hr tablet Take 1 tablet (30 mg total) by mouth daily.   losartan (COZAAR) 25 MG tablet TAKE 1 TABLET (25 MG TOTAL) BY MOUTH TWO (2) TIMES DAILY.   meloxicam (MOBIC) 15 MG tablet Take 15 mg by mouth daily.   nitroGLYCERIN (NITROSTAT) 0.4 MG SL tablet Place 1 tablet (0.4 mg total) under the tongue every 5 (five) minutes as needed for chest pain.   oxybutynin (DITROPAN XL) 15 MG 24 hr tablet Take 15 mg by mouth as needed (Bowel or bladder control).    Oxycodone HCl 10 MG TABS Take 1 tablet by mouth every four hours as needed for pain stop MS Contin. (Patient taking differently: Take 1 tablet by mouth as needed (pain).)   polyethylene glycol (MIRALAX / GLYCOLAX) packet Take 8.6 g by mouth as needed for mild constipation.   ranolazine (RANEXA) 500 MG 12 hr tablet Take 1 tablet (500 mg total) by mouth 2 (two) times daily.   traZODone (DESYREL) 150 MG tablet Take 150 mg by mouth at bedtime.      Allergies:   Cephalosporins   Social History   Socioeconomic History   Marital status: Married    Spouse name: Not on file   Number of children: 2   Years of education: Not on file   Highest education level: Not on file  Occupational History   Not on file  Tobacco Use   Smoking status: Some Days    Packs/day: 0.25    Years: 40.00    Total pack years: 10.00    Types: Cigarettes   Smokeless tobacco: Never  Vaping Use   Vaping Use: Never used  Substance and Sexual Activity   Alcohol use: No   Drug use: No   Sexual activity: Not on file  Other Topics Concern   Not on file  Social History Narrative   Not on file   Social Determinants of Health   Financial Resource Strain: Not on file  Food Insecurity: Not on file  Transportation Needs: Not on file  Physical Activity: Not on file  Stress: Not on file  Social Connections: Not on file     Family History: The patient's family history includes Breast cancer in her mother; COPD in her father; Colon polyps in her mother; Diabetes in her father; Heart attack in her father; Heart disease in her father; Hypertension in her father; Kidney disease in her father. There is no history of Colon cancer or Esophageal cancer. ROS:   Please see the history of present illness.    All 14 point review of systems negative except as described per history of present illness  EKGs/Labs/Other Studies Reviewed:      Recent Labs: No results found for requested labs within last 365 days.  Recent Lipid  Panel    Component Value Date/Time   CHOL 138 10/30/2017 0227   TRIG 25 10/30/2017 0227   HDL 69 10/30/2017 0227   CHOLHDL 2.0 10/30/2017 0227   VLDL 5 10/30/2017 0227   LDLCALC 64 10/30/2017 0227    Physical Exam:    VS:  BP 120/80 (BP Location: Left Arm, Patient Position: Sitting, Cuff Size: Normal)   Pulse 66   Ht 5' 4.5" (1.638 m)  Wt 124 lb (56.2 kg)   SpO2 99%   BMI 20.96 kg/m     Wt Readings from Last 3 Encounters:  02/25/22 124 lb (56.2 kg)  01/11/22 128 lb 6.4 oz (58.2 kg)  11/29/21 125 lb 6.4 oz (56.9 kg)     GEN:  Well nourished, well developed in no acute distress HEENT: Normal NECK: No JVD; No carotid bruits LYMPHATICS: No lymphadenopathy CARDIAC: RRR, no murmurs, no rubs, no gallops RESPIRATORY:  Clear to auscultation without rales, wheezing or rhonchi  ABDOMEN: Soft, non-tender, non-distended MUSCULOSKELETAL:  No edema; No deformity  SKIN: Warm and dry LOWER EXTREMITIES: no swelling NEUROLOGIC:  Alert and oriented x 3 PSYCHIATRIC:  Normal affect   ASSESSMENT:    1. Coronary artery disease involving native coronary artery of native heart without angina pectoris   2. Takotsubo cardiomyopathy   3. Tobacco use    PLAN:    In order of problems listed above:  Coronary disease 50% LAD stress test negative just few months ago but she still complain of having symptoms which are worrisome after admit atypical but with clear past medical history and risk factors I think we need to rule out coronary artery disease.  I do not think we deserve to go to cardiac Cath Lab but I think coronary CT angio will be appropriate test.   I will give her Imdur.  She said that she took nitroglycerin and did not get headache hopefully she will be able to tolerate that 1 Takotsubo cardiomyopathy: Last echocardiogram showed preserved ejection fraction Smoking of course a problem she understands.   Medication Adjustments/Labs and Tests Ordered: Current medicines are reviewed  at length with the patient today.  Concerns regarding medicines are outlined above.  No orders of the defined types were placed in this encounter.  Medication changes: No orders of the defined types were placed in this encounter.   Signed, Park Liter, MD, [facility] 02/25/2022 3:54 PM    [place]

## 2022-03-02 DIAGNOSIS — E785 Hyperlipidemia, unspecified: Secondary | ICD-10-CM | POA: Diagnosis not present

## 2022-03-02 DIAGNOSIS — I10 Essential (primary) hypertension: Secondary | ICD-10-CM | POA: Diagnosis not present

## 2022-03-02 DIAGNOSIS — Z791 Long term (current) use of non-steroidal anti-inflammatories (NSAID): Secondary | ICD-10-CM | POA: Diagnosis not present

## 2022-03-02 DIAGNOSIS — K5900 Constipation, unspecified: Secondary | ICD-10-CM | POA: Diagnosis not present

## 2022-03-02 DIAGNOSIS — N952 Postmenopausal atrophic vaginitis: Secondary | ICD-10-CM | POA: Diagnosis not present

## 2022-03-02 DIAGNOSIS — Z008 Encounter for other general examination: Secondary | ICD-10-CM | POA: Diagnosis not present

## 2022-03-02 DIAGNOSIS — Z79891 Long term (current) use of opiate analgesic: Secondary | ICD-10-CM | POA: Diagnosis not present

## 2022-03-02 DIAGNOSIS — M1990 Unspecified osteoarthritis, unspecified site: Secondary | ICD-10-CM | POA: Diagnosis not present

## 2022-03-02 DIAGNOSIS — I25119 Atherosclerotic heart disease of native coronary artery with unspecified angina pectoris: Secondary | ICD-10-CM | POA: Diagnosis not present

## 2022-03-02 DIAGNOSIS — I700 Atherosclerosis of aorta: Secondary | ICD-10-CM | POA: Diagnosis not present

## 2022-03-02 DIAGNOSIS — R69 Illness, unspecified: Secondary | ICD-10-CM | POA: Diagnosis not present

## 2022-03-02 DIAGNOSIS — Z8249 Family history of ischemic heart disease and other diseases of the circulatory system: Secondary | ICD-10-CM | POA: Diagnosis not present

## 2022-03-02 DIAGNOSIS — I252 Old myocardial infarction: Secondary | ICD-10-CM | POA: Diagnosis not present

## 2022-03-17 DIAGNOSIS — R072 Precordial pain: Secondary | ICD-10-CM | POA: Diagnosis not present

## 2022-03-17 DIAGNOSIS — R079 Chest pain, unspecified: Secondary | ICD-10-CM | POA: Diagnosis not present

## 2022-03-21 DIAGNOSIS — I2510 Atherosclerotic heart disease of native coronary artery without angina pectoris: Secondary | ICD-10-CM | POA: Diagnosis not present

## 2022-03-23 ENCOUNTER — Encounter: Payer: Self-pay | Admitting: Cardiology

## 2022-03-24 ENCOUNTER — Telehealth: Payer: Self-pay

## 2022-03-24 NOTE — Telephone Encounter (Signed)
-----   Message from Park Liter, MD sent at 03/23/2022  4:32 PM EST ----- Coronary CT additional borderline stenosis seen in the LAD.  Will discuss details at her next visit

## 2022-03-24 NOTE — Telephone Encounter (Signed)
Patient notified of results.

## 2022-04-21 DIAGNOSIS — M47812 Spondylosis without myelopathy or radiculopathy, cervical region: Secondary | ICD-10-CM | POA: Diagnosis not present

## 2022-04-21 DIAGNOSIS — G894 Chronic pain syndrome: Secondary | ICD-10-CM | POA: Diagnosis not present

## 2022-04-21 DIAGNOSIS — M25551 Pain in right hip: Secondary | ICD-10-CM | POA: Diagnosis not present

## 2022-04-21 DIAGNOSIS — M47816 Spondylosis without myelopathy or radiculopathy, lumbar region: Secondary | ICD-10-CM | POA: Diagnosis not present

## 2022-05-24 ENCOUNTER — Other Ambulatory Visit (HOSPITAL_COMMUNITY)
Admission: RE | Admit: 2022-05-24 | Discharge: 2022-05-24 | Disposition: A | Discharge status: Home / Self Care | Payer: Medicare HMO | Source: Other Acute Inpatient Hospital | Attending: Oral and Maxillofacial Surgery | Admitting: Oral and Maxillofacial Surgery

## 2022-05-24 DIAGNOSIS — M869 Osteomyelitis, unspecified: Secondary | ICD-10-CM | POA: Insufficient documentation

## 2022-05-27 ENCOUNTER — Ambulatory Visit: Payer: Medicare HMO | Attending: Cardiology | Admitting: Cardiology

## 2022-05-27 ENCOUNTER — Encounter: Payer: Self-pay | Admitting: Cardiology

## 2022-05-27 VITALS — BP 124/80 | HR 76 | Ht 64.0 in | Wt 125.8 lb

## 2022-05-27 DIAGNOSIS — I5181 Takotsubo syndrome: Secondary | ICD-10-CM

## 2022-05-27 DIAGNOSIS — Z720 Tobacco use: Secondary | ICD-10-CM | POA: Diagnosis not present

## 2022-05-27 DIAGNOSIS — I2510 Atherosclerotic heart disease of native coronary artery without angina pectoris: Secondary | ICD-10-CM

## 2022-05-27 DIAGNOSIS — R0609 Other forms of dyspnea: Secondary | ICD-10-CM

## 2022-05-27 NOTE — Patient Instructions (Signed)
Medication Instructions:  Your physician recommends that you continue on your current medications as directed. Please refer to the Current Medication list given to you today.  *If you need a refill on your cardiac medications before your next appointment, please call your pharmacy*   Lab Work: None ordered If you have labs (blood work) drawn today and your tests are completely normal, you will receive your results only by: [place] (if you have MyChart) OR A paper copy in the mail If you have any lab test that is abnormal or we need to change your treatment, we will call you to review the results.   Testing/Procedures: None ordered   Follow-Up: At [facility], you and your health needs are our priority.  As part of our continuing mission to provide you with exceptional heart care, we have created designated Provider Care Teams.  These Care Teams include your primary Cardiologist (physician) and Advanced Practice Providers (APPs -  Physician Assistants and Nurse Practitioners) who all work together to provide you with the care you need, when you need it.  We recommend signing up for the patient portal called "MyChart".  Sign up information is provided on this After Visit Summary.  MyChart is used to connect with patients for Virtual Visits (Telemedicine).  Patients are able to view lab/test results, encounter notes, upcoming appointments, etc.  Non-urgent messages can be sent to your provider as well.   To learn more about what you can do with MyChart, go to [email or web address].    Your next appointment:   3 month(s)  The format for your next appointment:   In Person  Provider:   Jenne Campus, MD    Other Instructions none  Important Information About [place]

## 2022-05-27 NOTE — Progress Notes (Signed)
Cardiology Office Note:    Date:  05/27/2022   ID:  Kathryn [name], DOB [birth year], MRN [id number]  PCP:  Ernestene Kiel, MD  Cardiologist:  Jenne Campus, MD    Referring MD: Ernestene Kiel, MD   Chief Complaint  Patient presents with   Follow-up    History of Present Illness:    Kathryn [name] is a 67 y.o. female   past medical history significant for Takotsubo cardiomyopathy diagnosed in 2019, cardiac catheterization at that time showed up to 50% stenosis of left LAD.  Additional problem include essential hypertension, dyslipidemia.  Last time we decided to do coronary CT angiogram to make sure she does not have any reactivation of the problem however it did not happen.  She end up having some dental infection and multiple surgeries drainage multiple antibiotic swollen face simply feeling miserable gradually getting better finally.  Denies have any chest pain tightness squeezing pressure burning chest she said likely her chest is feeling good right now.  Past Medical History:  Diagnosis Date   Anxiety    Arthritis    Atypical chest pain 08/23/2018   Chronic pain 11/02/2017   Cigarette smoker 11/27/2017   Coronary artery disease 50% mid LAD and mid circumflex on cardiac catheterization 2018 11/15/2018   COVID-19 virus infection 08/30/2018   Dyspnea on exertion 08/23/2018   Heart attack ([place]) 10/2017   Infection due to 2019 novel coronavirus 08/28/2018   Formatting of this note might be different from the original. Notification of POS result Isolation since day of testing of patient and family members [place] Dept will call with specific instructions Conv Care their POC until the LHD contacts them Obtaining testing for symptomatic Family Members Discuss self-isolation measures Discuss in-home separation and safety measures Discuss web-based C   Mass of right axilla 08/04/2015   Neck pain 10/11/2011   Odontogenic infection of jaw    Personal history of malignant  neoplasm of nasal cavities, middle ear, and accessory sinuses 11/10/2011   Pneumonia    Skin cancer (melanoma) ([place])    Stress incontinence    Takotsubo cardiomyopathy    Tobacco use    UTI (urinary tract infection)     Past Surgical History:  Procedure Laterality Date   ABDOMINAL HYSTERECTOMY     cervical cancer   back skin flap     BACK SURGERY     lumbar and cervical   hernia surgery     X2    LEFT HEART CATH AND CORONARY ANGIOGRAPHY N/A 10/30/2017   Procedure: LEFT HEART CATH AND CORONARY ANGIOGRAPHY;  Surgeon: Lorretta Harp, MD;  Location: [facility];  Service: Cardiovascular;  Laterality: N/A;   MANDIBLE SURGERY     lower jaw   MELANOMA EXCISION     L arm and L leg, 2014   muscle flap surgery     NECK SURGERY     NOSE SURGERY  2012   Lost her nose due to cancer   SKIN CANCER EXCISION     squamous cell cancer , 2012   WRIST SURGERY      Current Medications: Current Meds  Medication Sig   ALPRAZolam (XANAX) 0.25 MG tablet Take 0.25 mg by mouth at bedtime as needed for anxiety.    amLODipine (NORVASC) 5 MG tablet Take 0.5 tablets (2.5 mg total) by mouth daily.   amoxicillin (AMOXIL) 500 MG capsule Take 500 mg by mouth 3 (three) times daily. 10 days- start 05/26/2022   aspirin  EC 81 MG tablet Take 81 mg by mouth daily.   atorvastatin (LIPITOR) 80 MG tablet Take 1 tablet (80 mg total) by mouth daily.   clindamycin (CLEOCIN) 300 MG capsule Take 300 mg by mouth 3 (three) times daily. 14 days/start 05/16/2022   estradiol (ESTRACE) 1 MG tablet Take 1.5 mg by mouth daily.   fluorouracil (EFUDEX) 5 % cream Apply 1 application  topically as needed (pre screening cancer).   furosemide (LASIX) 20 MG tablet TAKE 1 TABLET (20 MG TOTAL) BY MOUTH AS NEEDED FOR FLUID.   isosorbide mononitrate (IMDUR) 30 MG 24 hr tablet Take 1 tablet (30 mg total) by mouth daily.   losartan (COZAAR) 25 MG tablet TAKE 1 TABLET (25 MG TOTAL) BY MOUTH TWO (2) TIMES DAILY. (Patient taking  differently: Take 25 mg by mouth 2 (two) times daily.)   meloxicam (MOBIC) 15 MG tablet Take 15 mg by mouth daily.   metroNIDAZOLE (FLAGYL) 500 MG tablet Take 500 mg by mouth 3 (three) times daily.   morphine (MS CONTIN) 30 MG 12 hr tablet Take 15 mg by mouth 2 (two) times daily.   nitroGLYCERIN (NITROSTAT) 0.4 MG SL tablet Place 1 tablet (0.4 mg total) under the tongue every 5 (five) minutes as needed for chest pain.   oxybutynin (DITROPAN XL) 15 MG 24 hr tablet Take 15 mg by mouth as needed (Bowel or bladder control).   Oxycodone HCl 10 MG TABS Take 1 tablet by mouth every four hours as needed for pain stop MS Contin. (Patient taking differently: Take 1 tablet by mouth as needed (pain).)   polyethylene glycol (MIRALAX / GLYCOLAX) packet Take 8.6 g by mouth as needed for mild constipation.   ranolazine (RANEXA) 500 MG 12 hr tablet Take 1 tablet (500 mg total) by mouth 2 (two) times daily.   traZODone (DESYREL) 150 MG tablet Take 150 mg by mouth at bedtime.    [DISCONTINUED] metoprolol tartrate (LOPRESSOR) 100 MG tablet Take one tablet 2 hours before cardiac CT for heart greater than 55     Allergies:   Cephalosporins   Social History   Socioeconomic History   Marital status: Married    Spouse name: Not on file   Number of children: 2   Years of education: Not on file   Highest education level: Not on file  Occupational History   Not on file  Tobacco Use   Smoking status: Some Days    Packs/day: 0.25    Years: 40.00    Additional pack years: 0.00    Total pack years: 10.00    Types: Cigarettes   Smokeless tobacco: Never  Vaping Use   Vaping Use: Never used  Substance and Sexual Activity   Alcohol use: No   Drug use: No   Sexual activity: Not on file  Other Topics Concern   Not on file  Social History Narrative   Not on file   Social Determinants of Health   Financial Resource Strain: Not on file  Food Insecurity: Not on file  Transportation Needs: Not on file   Physical Activity: Not on file  Stress: Not on file  Social Connections: Not on file     Family History: The patient's family history includes Breast cancer in her mother; COPD in her father; Colon polyps in her mother; Diabetes in her father; Heart attack in her father; Heart disease in her father; Hypertension in her father; Kidney disease in her father. There is no history of Colon cancer or  Esophageal cancer. ROS:   Please see the history of present illness.    All 14 point review of systems negative except as described per history of present illness  EKGs/Labs/Other Studies Reviewed:      Recent Labs: No results found for requested labs within last 365 days.  Recent Lipid Panel    Component Value Date/Time   CHOL 138 10/30/2017 0227   TRIG 25 10/30/2017 0227   HDL 69 10/30/2017 0227   CHOLHDL 2.0 10/30/2017 0227   VLDL 5 10/30/2017 0227   LDLCALC 64 10/30/2017 0227    Physical Exam:    VS:  BP 124/80 (BP Location: Left Arm, Patient Position: Sitting)   Pulse 76   Ht 5\' 4"  (1.626 m)   Wt 125 lb 12.8 oz (57.1 kg)   SpO2 97%   BMI 21.59 kg/m     Wt Readings from Last 3 Encounters:  05/27/22 125 lb 12.8 oz (57.1 kg)  02/25/22 124 lb (56.2 kg)  01/11/22 128 lb 6.4 oz (58.2 kg)     GEN:  Well nourished, well developed in no acute distress HEENT: Normal NECK: No JVD; No carotid bruits LYMPHATICS: No lymphadenopathy CARDIAC: RRR, no murmurs, no rubs, no gallops RESPIRATORY:  Clear to auscultation without rales, wheezing or rhonchi  ABDOMEN: Soft, non-tender, non-distended MUSCULOSKELETAL:  No edema; No deformity  SKIN: Warm and dry LOWER EXTREMITIES: no swelling NEUROLOGIC:  Alert and oriented x 3 PSYCHIATRIC:  Normal affect   ASSESSMENT:    1. Coronary artery disease involving native coronary artery of native heart without angina pectoris   2. Takotsubo cardiomyopathy   3. Dyspnea on exertion   4. Tobacco use    PLAN:    In order of problems listed  above:  Coronary disease 50% stenosis of LAD however did have some chest pain.  Plan was to do coronary CT angio look like never happened.  Now she is asymptomatic but she is suffering so much with her dental work that it is difficult to judge I will simply ask her to come back to my office in about 3 months and see how she does. Dyslipidemia I did review her K PN which show me LDL of 35 and HDL 87 she is taking Lipitor 80 which I will continue. Essential hypertension blood pressure well-controlled. History of Takotsubo last ejection fraction was normal   Medication Adjustments/Labs and Tests Ordered: Current medicines are reviewed at length with the patient today.  Concerns regarding medicines are outlined above.  No orders of the defined types were placed in this encounter.  Medication changes: No orders of the defined types were placed in this encounter.   Signed, Park Liter, MD, [facility] 05/27/2022 3:08 PM    [facility]

## 2022-06-01 DIAGNOSIS — L570 Actinic keratosis: Secondary | ICD-10-CM | POA: Diagnosis not present

## 2022-06-01 DIAGNOSIS — L578 Other skin changes due to chronic exposure to nonionizing radiation: Secondary | ICD-10-CM | POA: Diagnosis not present

## 2022-06-01 DIAGNOSIS — B009 Herpesviral infection, unspecified: Secondary | ICD-10-CM | POA: Diagnosis not present

## 2022-06-01 DIAGNOSIS — D492 Neoplasm of unspecified behavior of bone, soft tissue, and skin: Secondary | ICD-10-CM | POA: Diagnosis not present

## 2022-06-02 ENCOUNTER — Other Ambulatory Visit: Payer: Self-pay

## 2022-06-02 ENCOUNTER — Ambulatory Visit: Payer: Medicare HMO | Admitting: Internal Medicine

## 2022-06-02 ENCOUNTER — Encounter: Payer: Self-pay | Admitting: Internal Medicine

## 2022-06-02 VITALS — BP 106/70 | HR 69 | Temp 97.9°F | Resp 16 | Ht 64.0 in | Wt 128.8 lb

## 2022-06-02 DIAGNOSIS — M272 Inflammatory conditions of jaws: Secondary | ICD-10-CM | POA: Insufficient documentation

## 2022-06-02 MED ORDER — AMOXICILLIN-POT CLAVULANATE 875-125 MG PO TABS: 1.0000 | ORAL_TABLET | Freq: Two times a day (BID) | ORAL | 0 refills | Status: DC

## 2022-06-02 NOTE — Progress Notes (Signed)
[facility]  Reason for Consult: Left mandibular osteomyelitis  Referring Provider: Dr. Lady Saucier   HPI:    Kathryn [name] is a 67 y.o. female with PMHx as below who presents to the clinic for left mandibular osteomyelitis.   Patient underwent left mandibular surgical placement of a dental implant on 02/08/2022.  She initially did well, however, presented on 03/11/2022 with left facial swelling and pain.  The dental implant had become infected and was removed.  She healed well, however, presented again to her dentist on 05/16/2022 with recurrent facial swelling and mandibular pain.  A CT scan revealed findings consistent with osteomyelitis of the left mandible with associated possible pathologic fracture.  She was prescribed clindamycin 300 mg 3 times daily.  However, she had minimal improvement in her symptoms.  She underwent another debridement of the area with fixation of the fracture on 05/24/2022.  Cultures obtained from the left mandible notable for rare Staphylococcus epidermidis (oxacillin sensitive) and rare Streptococcus intermedius (susceptibilities not done).  Patient is currently on amoxicillin and metronidazole per her dentist.  There was fixation hardware placed at time of her debridement 3/12 with a metal plate.   Patient's Medications  New Prescriptions   AMOXICILLIN-CLAVULANATE (AUGMENTIN) 875-125 MG TABLET    Take 1 tablet by mouth 2 (two) times daily.  Previous Medications   ALPRAZOLAM (XANAX) 0.25 MG TABLET    Take 0.25 mg by mouth at bedtime as needed for anxiety.    AMLODIPINE (NORVASC) 5 MG TABLET    Take 0.5 tablets (2.5 mg total) by mouth daily.   ASPIRIN EC 81 MG TABLET    Take 81 mg by mouth daily.   ATORVASTATIN (LIPITOR) 80 MG TABLET    Take 1 tablet (80 mg total) by mouth daily.   ESTRADIOL (ESTRACE) 1 MG TABLET    Take 1.5 mg by mouth daily.   FLUOROURACIL (EFUDEX) 5 % CREAM    Apply 1 application  topically as needed (pre  screening cancer).   FUROSEMIDE (LASIX) 20 MG TABLET    TAKE 1 TABLET (20 MG TOTAL) BY MOUTH AS NEEDED FOR FLUID.   ISOSORBIDE MONONITRATE (IMDUR) 30 MG 24 HR TABLET    Take 1 tablet (30 mg total) by mouth daily.   LOSARTAN (COZAAR) 25 MG TABLET    TAKE 1 TABLET (25 MG TOTAL) BY MOUTH TWO (2) TIMES DAILY.   MELOXICAM (MOBIC) 15 MG TABLET    Take 15 mg by mouth daily.   MORPHINE (MS CONTIN) 30 MG 12 HR TABLET    Take 15 mg by mouth 2 (two) times daily.   MUPIROCIN OINTMENT (BACTROBAN) 2 %    SMARTSIG:1 Application Topical 2-3 Times Daily   NITROGLYCERIN (NITROSTAT) 0.4 MG SL TABLET    Place 1 tablet (0.4 mg total) under the tongue every 5 (five) minutes as needed for chest pain.   OXYBUTYNIN (DITROPAN XL) 15 MG 24 HR TABLET    Take 15 mg by mouth as needed (Bowel or bladder control).   OXYCODONE HCL 10 MG TABS    Take 1 tablet by mouth every four hours as needed for pain stop MS Contin.   POLYETHYLENE GLYCOL (MIRALAX / GLYCOLAX) PACKET    Take 8.6 g by mouth as needed for mild constipation.   RANOLAZINE (RANEXA) 500 MG 12 HR TABLET    Take 1 tablet (500 mg total) by mouth 2 (two) times daily.   TRAZODONE (DESYREL) 150 MG TABLET  Take 150 mg by mouth at bedtime.   Modified Medications   No medications on file  Discontinued Medications   AMOXICILLIN (AMOXIL) 500 MG CAPSULE    Take 500 mg by mouth 3 (three) times daily. 10 days- start 05/26/2022   CLINDAMYCIN (CLEOCIN) 300 MG CAPSULE    Take 300 mg by mouth 3 (three) times daily. 14 days/start 05/16/2022   METRONIDAZOLE (FLAGYL) 500 MG TABLET    Take 500 mg by mouth 3 (three) times daily.      Past Medical History:  Diagnosis Date   Anxiety    Arthritis    Atypical chest pain 08/23/2018   Chronic pain 11/02/2017   Cigarette smoker 11/27/2017   Coronary artery disease 50% mid LAD and mid circumflex on cardiac catheterization 2018 11/15/2018   COVID-19 virus infection 08/30/2018   Dyspnea on exertion 08/23/2018   Heart attack ([place]) 10/2017    Infection due to 2019 novel coronavirus 08/28/2018   Formatting of this note might be different from the original. Notification of POS result Isolation since day of testing of patient and family members [place] [facility] will call with specific instructions Conv Care their POC until the [facility] contacts them Obtaining testing for symptomatic Family Members Discuss self-isolation measures Discuss in-home separation and safety measures Discuss web-based C   Mass of right axilla 08/04/2015   Neck pain 10/11/2011   Odontogenic infection of jaw    Personal history of malignant neoplasm of nasal cavities, middle ear, and accessory sinuses 11/10/2011   Pneumonia    Skin cancer (melanoma) ([place])    Stress incontinence    Takotsubo cardiomyopathy    Tobacco use    UTI (urinary tract infection)     Social History   Tobacco Use   Smoking status: Some Days    Packs/day: 0.25    Years: 40.00    Additional pack years: 0.00    Total pack years: 10.00    Types: Cigarettes   Smokeless tobacco: Never  Vaping Use   Vaping Use: Never used  Substance Use Topics   Alcohol use: No   Drug use: No    Family History  Problem Relation Age of Onset   Breast cancer Mother        said all her sisters too   Colon polyps Mother    Heart disease Father    Diabetes Father    Kidney disease Father        stage 3   COPD Father    Heart attack Father    Hypertension Father    Colon cancer Neg Hx    Esophageal cancer Neg Hx     Allergies  Allergen Reactions   Cephalosporins Rash    Review of Systems  All other systems reviewed and are negative.     OBJECTIVE:    Vitals:   06/02/22 1403  BP: 106/70  Pulse: 69  Resp: 16  Temp: 97.9 F (36.6 C)  TempSrc: Oral  SpO2: 99%  Weight: 128 lb 12.8 oz (58.4 kg)  Height: 5\' 4"  (1.626 m)     Body mass index is 22.11 kg/m.  Physical Exam Constitutional:      Appearance: Normal appearance.  HENT:     Head: Normocephalic and atraumatic.      Mouth/Throat:     Comments: The left mandible has swelling but currently not much drainage.  Photos reviewed show significant swelling, erythema and drainage from 05/24/22 Pulmonary:     Effort: Pulmonary effort is normal.  No respiratory distress.  Musculoskeletal:        General: Normal range of motion.  Skin:    General: Skin is warm and dry.     Comments: Left wrist fever blisters.   Neurological:     General: No focal deficit present.     Mental Status: She is alert and oriented to person, place, and time.  Psychiatric:        Mood and Affect: Mood normal.        Behavior: Behavior normal.      Labs and Microbiology:     Latest Ref Rng & Units 06/11/2020    4:22 PM 10/31/2017    5:39 AM 10/30/2017    2:27 AM  CBC  WBC 3.4 - 10.8 x10E3/uL 8.2  11.6  15.0   Hemoglobin 11.1 - 15.9 g/dL 13.9  13.5  14.8   Hematocrit 34.0 - 46.6 % 39.9  41.0  44.1   Platelets 150 - 450 x10E3/uL 298  304  368       Latest Ref Rng & Units 06/11/2020    4:22 PM 06/26/2019   11:34 AM 03/28/2019    8:41 AM  CMP  Glucose 65 - 99 mg/dL 111  101  99   BUN 8 - 27 mg/dL 11  14  14    Creatinine 0.57 - 1.00 mg/dL 0.75  0.73  0.78   Sodium 134 - 144 mmol/L 142  141  139   Potassium 3.5 - 5.2 mmol/L 4.9  5.2  5.0   Chloride 96 - 106 mmol/L 104  104  105   CO2 20 - 29 mmol/L 23  27  24    Calcium 8.7 - 10.3 mg/dL 9.3  9.6  9.3   Total Protein 6.0 - 8.5 g/dL 6.2     Total Bilirubin 0.0 - 1.2 mg/dL 0.5     Alkaline Phos 44 - 121 IU/L 44     AST 0 - 40 IU/L 20     ALT 0 - 32 IU/L 17        Recent Results (from the past 240 hour(s))  Aerobic/Anaerobic Culture w Gram Stain (surgical/deep wound)     Status: None (Preliminary result)   Collection Time: 05/24/22  3:19 PM   Specimen: Mandible; Abscess  Result Value Ref Range Status   Specimen Description MANDIBLE LEFT  Final   Special Requests SWAB  Final   Gram Stain NO WBC SEEN NO ORGANISMS SEEN   Final   Culture   Final    RARE STAPHYLOCOCCUS  EPIDERMIDIS RARE STREPTOCOCCUS INTERMEDIUS SUSCEPTIBILITIES TO FOLLOW NO ANAEROBES ISOLATED Performed at [facility], [address] [place], [place] [address]    Report Status PENDING  Incomplete   Organism ID, Bacteria STAPHYLOCOCCUS EPIDERMIDIS  Final      Susceptibility   Staphylococcus epidermidis - MIC*    CIPROFLOXACIN <=0.5 SENSITIVE Sensitive     ERYTHROMYCIN >=8 RESISTANT Resistant     GENTAMICIN <=0.5 SENSITIVE Sensitive     OXACILLIN <=0.25 SENSITIVE Sensitive     TETRACYCLINE <=1 SENSITIVE Sensitive     VANCOMYCIN 1 SENSITIVE Sensitive     TRIMETH/SULFA <=10 SENSITIVE Sensitive     CLINDAMYCIN >=8 RESISTANT Resistant     RIFAMPIN <=0.5 SENSITIVE Sensitive     Inducible Clindamycin NEGATIVE Sensitive     * RARE STAPHYLOCOCCUS EPIDERMIDIS     ASSESSMENT & PLAN:    Osteomyelitis of mandible  Patient with left mandibular osteomyelitis that developed after dental  implant procedure in November 2023.  Her implant was subsequently removed following this complication and she underwent debridement of the area on 05/24/2022 with her dentist.  Those cultures have grown oxacillin sensitive Staph epidermidis and strep intermedius.  I have contacted the microbiology lab for sensitivities on the strep intermedius.  Presumably this will be penicillin sensitive and will transition to Augmentin 875 twice daily.  Will obtain baseline lab work today.  Plan for follow-up in 4 weeks.  Ultimately, she will likely require more long term suppression due to hardware placement at time of her last debridement to address the pathologic fracture of her jaw.    Orders Placed This Encounter  Procedures   C-reactive protein   Sedimentation rate   Basic Metabolic Panel (BMET)   [facility] 06/02/2022, 2:30 PM   I have personally spent 45 minutes involved in face-to-face and non-face-to-face activities for  this patient on the day of the visit. Professional time spent includes the following activities: Preparing to see the patient (review of tests), Obtaining and/or reviewing separately obtained history (admission/discharge record), Performing a medically appropriate examination and/or evaluation , Ordering medications/tests/procedures, referring and communicating with other health care professionals, Documenting clinical information in the EMR, Independently interpreting results (not separately reported), Communicating results to the patient/family/caregiver, Counseling and educating the patient/family/caregiver and Care coordination (not separately reported).

## 2022-06-02 NOTE — Assessment & Plan Note (Signed)
  Patient with left mandibular osteomyelitis that developed after dental implant procedure in November 2023.  Her implant was subsequently removed following this complication and she underwent debridement of the area on 05/24/2022 with her dentist.  Those cultures have grown oxacillin sensitive Staph epidermidis and strep intermedius.  I have contacted the microbiology lab for sensitivities on the strep intermedius.  Presumably this will be penicillin sensitive and will transition to Augmentin 875 twice daily.  Will obtain baseline lab work today.  Plan for follow-up in 4 weeks.  Ultimately, she will likely require more long term suppression due to hardware placement at time of her last debridement to address the pathologic fracture of her jaw.

## 2022-06-03 LAB — CBC
HCT: 41.3 % (ref 35.0–45.0)
Hemoglobin: 14 g/dL (ref 11.7–15.5)
MCH: 33.6 pg — ABNORMAL HIGH (ref 27.0–33.0)
MCHC: 33.9 g/dL (ref 32.0–36.0)
MCV: 99 fL (ref 80.0–100.0)
MPV: 9.3 fL (ref 7.5–12.5)
Platelets: 503 10*3/uL — ABNORMAL HIGH (ref 140–400)
RBC: 4.17 10*6/uL (ref 3.80–5.10)
RDW: 12.8 % (ref 11.0–15.0)
WBC: 7.9 10*3/uL (ref 3.8–10.8)

## 2022-06-03 LAB — BASIC METABOLIC PANEL
BUN: 16 mg/dL (ref 7–25)
CO2: 29 mmol/L (ref 20–32)
Calcium: 9.4 mg/dL (ref 8.6–10.4)
Chloride: 103 mmol/L (ref 98–110)
Creat: 0.84 mg/dL (ref 0.50–1.05)
Glucose, Bld: 104 mg/dL — ABNORMAL HIGH (ref 65–99)
Potassium: 5.4 mmol/L — ABNORMAL HIGH (ref 3.5–5.3)
Sodium: 139 mmol/L (ref 135–146)

## 2022-06-03 LAB — SEDIMENTATION RATE: Sed Rate: 11 mm/h (ref 0–30)

## 2022-06-03 LAB — C-REACTIVE PROTEIN: CRP: 0.9 mg/L (ref <8.0)

## 2022-06-05 LAB — AEROBIC/ANAEROBIC CULTURE W GRAM STAIN (SURGICAL/DEEP WOUND): Gram Stain: NONE SEEN

## 2022-06-16 DIAGNOSIS — M47812 Spondylosis without myelopathy or radiculopathy, cervical region: Secondary | ICD-10-CM | POA: Diagnosis not present

## 2022-06-16 DIAGNOSIS — M47816 Spondylosis without myelopathy or radiculopathy, lumbar region: Secondary | ICD-10-CM | POA: Diagnosis not present

## 2022-06-16 DIAGNOSIS — G894 Chronic pain syndrome: Secondary | ICD-10-CM | POA: Diagnosis not present

## 2022-06-16 DIAGNOSIS — M25551 Pain in right hip: Secondary | ICD-10-CM | POA: Diagnosis not present

## 2022-06-30 ENCOUNTER — Other Ambulatory Visit: Payer: Self-pay

## 2022-06-30 MED ORDER — LOSARTAN POTASSIUM 25 MG PO TABS: 25.0000 mg | ORAL_TABLET | Freq: Two times a day (BID) | ORAL | 2 refills | Status: DC

## 2022-06-30 NOTE — Telephone Encounter (Signed)
Losartan sent.

## 2022-07-05 ENCOUNTER — Ambulatory Visit: Payer: Medicare HMO | Admitting: Internal Medicine

## 2022-07-05 ENCOUNTER — Other Ambulatory Visit: Payer: Self-pay

## 2022-07-05 ENCOUNTER — Encounter: Payer: Self-pay | Admitting: Internal Medicine

## 2022-07-05 VITALS — BP 125/82 | HR 72 | Resp 16 | Ht 64.0 in | Wt 125.0 lb

## 2022-07-05 DIAGNOSIS — M272 Inflammatory conditions of jaws: Secondary | ICD-10-CM | POA: Diagnosis not present

## 2022-07-05 MED ORDER — AMOXICILLIN-POT CLAVULANATE 875-125 MG PO TABS
1.0000 | ORAL_TABLET | Freq: Two times a day (BID) | ORAL | 0 refills | Status: DC
Start: 2022-07-05 — End: 2022-09-19

## 2022-07-05 NOTE — Progress Notes (Signed)
[facility]  CHIEF COMPLAINT:    Follow up for left mandibular OM  SUBJECTIVE:    Kathryn [name] is a 67 y.o. female with PMHx as below who presents to the clinic for follow up left mandibular OM.   She presents today for 4-week follow up after initial visit 06/02/22. She was started on Augmentin at that time and reports no issues with tolerating the antibiotic.  She reports improvement on antibiotics and no major issues noted today. Her baseline inflammatory markers were normal.  She does report some continued jaw swelling and a clicking feeling where the metal plate was placed.   Please see A&P for the details of today's visit and status of the patient's medical problems.   Patient's Medications  New Prescriptions   No medications on file  Previous Medications   ALPRAZOLAM (XANAX) 0.25 MG TABLET    Take 0.25 mg by mouth at bedtime as needed for anxiety.    AMLODIPINE (NORVASC) 5 MG TABLET    Take 0.5 tablets (2.5 mg total) by mouth daily.   ASPIRIN EC 81 MG TABLET    Take 81 mg by mouth daily.   ATORVASTATIN (LIPITOR) 80 MG TABLET    Take 1 tablet (80 mg total) by mouth daily.   ESTRADIOL (ESTRACE) 1 MG TABLET    Take 1.5 mg by mouth daily.   FLUOROURACIL (EFUDEX) 5 % CREAM    Apply 1 application  topically as needed (pre screening cancer).   FUROSEMIDE (LASIX) 20 MG TABLET    TAKE 1 TABLET (20 MG TOTAL) BY MOUTH AS NEEDED FOR FLUID.   ISOSORBIDE MONONITRATE (IMDUR) 30 MG 24 HR TABLET    Take 1 tablet (30 mg total) by mouth daily.   LOSARTAN (COZAAR) 25 MG TABLET    Take 1 tablet (25 mg total) by mouth 2 (two) times daily. TAKE 1 TABLET (25 MG TOTAL) BY MOUTH TWO (2) TIMES DAILY. Strength: 25 mg   MELOXICAM (MOBIC) 15 MG TABLET    Take 15 mg by mouth daily.   MORPHINE (MS CONTIN) 30 MG 12 HR TABLET    Take 15 mg by mouth 2 (two) times daily.   MUPIROCIN OINTMENT (BACTROBAN) 2 %    SMARTSIG:1 Application Topical 2-3 Times Daily   NITROGLYCERIN  (NITROSTAT) 0.4 MG SL TABLET    Place 1 tablet (0.4 mg total) under the tongue every 5 (five) minutes as needed for chest pain.   OXYBUTYNIN (DITROPAN XL) 15 MG 24 HR TABLET    Take 15 mg by mouth as needed (Bowel or bladder control).   OXYCODONE HCL 10 MG TABS    Take 1 tablet by mouth every four hours as needed for pain stop MS Contin.   POLYETHYLENE GLYCOL (MIRALAX / GLYCOLAX) PACKET    Take 8.6 g by mouth as needed for mild constipation.   RANOLAZINE (RANEXA) 500 MG 12 HR TABLET    Take 1 tablet (500 mg total) by mouth 2 (two) times daily.   TRAZODONE (DESYREL) 150 MG TABLET    Take 150 mg by mouth at bedtime.   Modified Medications   Modified Medication Previous Medication   AMOXICILLIN-CLAVULANATE (AUGMENTIN) 875-125 MG TABLET amoxicillin-clavulanate (AUGMENTIN) 875-125 MG tablet      Take 1 tablet by mouth 2 (two) times daily.    Take 1 tablet by mouth 2 (two) times daily.  Discontinued Medications   No medications on file  Past Medical History:  Diagnosis Date   Anxiety    Arthritis    Atypical chest pain 08/23/2018   Chronic pain 11/02/2017   Cigarette smoker 11/27/2017   Coronary artery disease 50% mid LAD and mid circumflex on cardiac catheterization 2018 11/15/2018   COVID-19 virus infection 08/30/2018   Dyspnea on exertion 08/23/2018   Heart attack 10/2017   Infection due to 2019 novel coronavirus 08/28/2018   Formatting of this note might be different from the original. Notification of POS result Isolation since day of testing of patient and family members [facility] will call with specific instructions Conv Care their POC until the [facility] contacts them Obtaining testing for symptomatic Family Members Discuss self-isolation measures Discuss in-home separation and safety measures Discuss web-based C   Mass of right axilla 08/04/2015   Neck pain 10/11/2011   Odontogenic infection of jaw    Personal history of malignant neoplasm of nasal cavities, middle ear, and  accessory sinuses 11/10/2011   Pneumonia    Skin cancer (melanoma)    Stress incontinence    Takotsubo cardiomyopathy    Tobacco use    UTI (urinary tract infection)     Social History   Tobacco Use   Smoking status: Some Days    Packs/day: 0.25    Years: 40.00    Additional pack years: 0.00    Total pack years: 10.00    Types: Cigarettes   Smokeless tobacco: Never  Vaping Use   Vaping Use: Never used  Substance Use Topics   Alcohol use: No   Drug use: No    Family History  Problem Relation Age of Onset   Breast cancer Mother        said all her sisters too   Colon polyps Mother    Heart disease Father    Diabetes Father    Kidney disease Father        stage 3   COPD Father    Heart attack Father    Hypertension Father    Colon cancer Neg Hx    Esophageal cancer Neg Hx     Allergies  Allergen Reactions   Cephalosporins Rash    Review of Systems  All other systems reviewed and are negative.  Except as noted above.   OBJECTIVE:    Vitals:   07/05/22 1401  BP: 125/82  Pulse: 72  Resp: 16  Weight: 125 lb (56.7 kg)  Height:  (1.626 m)   Body mass index is 21.46 kg/m.  Physical Exam Constitutional:      Appearance: Normal appearance.  HENT:     Head:     Comments: Mild swelling of left mandible.  No drainage, erythema.  Abdominal:     General: There is no distension.     Palpations: Abdomen is soft.  Skin:    General: Skin is warm and dry.  Neurological:     General: No focal deficit present.     Mental Status: She is alert and oriented to person, place, and time.  Psychiatric:        Mood and Affect: Mood normal.        Behavior: Behavior normal.      Labs and Microbiology:    Latest Ref Rng & Units 06/02/2022    2:28 AM 06/11/2020    4:22 PM 10/31/2017    5:39 AM  CBC  WBC 3.8 - 10.8 Thousand/uL 7.9  8.2  11.6   Hemoglobin 11.7 - 15.5 g/dL  14.0  13.9  13.5   Hematocrit 35.0 - 45.0 % 41.3  39.9  41.0   Platelets 140 - 400  Thousand/uL 503  298  304       Latest Ref Rng & Units 06/02/2022    2:28 AM 06/11/2020    4:22 PM 06/26/2019   11:34 AM  CMP  Glucose 65 - 99 mg/dL 027  253  664   BUN 7 - 25 mg/dL Creatinine 0.50 - 1.05 mg/dL 4.03  4.74  2.59   Sodium 135 - 146 mmol/L 139  142  141   Potassium 3.5 - 5.3 mmol/L 5.4  4.9  5.2   Chloride 98 - 110 mmol/L 103  104  104   CO2 20 - 32 mmol/L Calcium 8.6 - 10.4 mg/dL 9.4  9.3  9.6   Total Protein 6.0 - 8.5 g/dL  6.2    Total Bilirubin 0.0 - 1.2 mg/dL  0.5    Alkaline Phos 44 - 121 IU/L  44    AST 0 - 40 IU/L  20    ALT 0 - 32 IU/L  17        ASSESSMENT & PLAN:    Osteomyelitis of mandible Patient here for follow up of left mandibular OM that developed after dental implant procedure in November 2023.  Her implant was subsequently removed and she underwent debridement on 05/24/22 with OR cultures growing Staph epi and Strep intermedius.  She had a metal plate placed at time of debridement.  She is doing well on Augmentin at 4 weeks currently.  Will continue this and obtain labs today.  Discussed with patient that she may require longer term suppression due to hardware placement at time of her debridement to address pathologic fracture.  Follow up in 8 more weeks which will be the 12 week mark of antibiotics.  At that time would consider continued suppression or stopping therapy and observing.   Orders Placed This Encounter  Procedures   CBC   Basic metabolic panel    Order Specific Question:   Has the patient fasted?    Answer:   No   Sedimentation rate   C-reactive protein       [facility] 07/05/2022, 2:29 PM  I have personally spent 20 minutes involved in face-to-face and non-face-to-face activities for this patient on the day of the visit. Professional time spent includes the following activities: Preparing to see the patient (review of tests),  Obtaining and/or reviewing separately obtained history (admission/discharge record), Performing a medically appropriate examination and/or evaluation , Ordering medications/tests/procedures, referring and communicating with other health care professionals, Documenting clinical information in the EMR, Independently interpreting results (not separately reported), Communicating results to the patient/family/caregiver, Counseling and educating the patient/family/caregiver and Care coordination (not separately reported).

## 2022-07-05 NOTE — Assessment & Plan Note (Signed)
Patient here for follow up of left mandibular OM that developed after dental implant procedure in November 2023.  Her implant was subsequently removed and she underwent debridement on 05/24/22 with OR cultures growing Staph epi and Strep intermedius.  She had a metal plate placed at time of debridement.  She is doing well on Augmentin at 4 weeks currently.  Will continue this and obtain labs today.  Discussed with patient that she may require longer term suppression due to hardware placement at time of her debridement to address pathologic fracture.  Follow up in 8 more weeks which will be the 12 week mark of antibiotics.  At that time would consider continued suppression or stopping therapy and observing.

## 2022-07-06 LAB — BASIC METABOLIC PANEL
BUN: 17 mg/dL (ref 7–25)
CO2: 29 mmol/L (ref 20–32)
Calcium: 9.6 mg/dL (ref 8.6–10.4)
Chloride: 105 mmol/L (ref 98–110)
Creat: 0.86 mg/dL (ref 0.50–1.05)
Glucose, Bld: 102 mg/dL — ABNORMAL HIGH (ref 65–99)
Potassium: 4.9 mmol/L (ref 3.5–5.3)
Sodium: 140 mmol/L (ref 135–146)

## 2022-07-06 LAB — C-REACTIVE PROTEIN: CRP: <3 mg/L (ref <8.0)

## 2022-07-06 LAB — CBC
HCT: 38.3 % (ref 35.0–45.0)
Hemoglobin: 13.1 g/dL (ref 11.7–15.5)
MCH: 33.9 pg — ABNORMAL HIGH (ref 27.0–33.0)
MCHC: 34.2 g/dL (ref 32.0–36.0)
MCV: 99.2 fL (ref 80.0–100.0)
MPV: 9.7 fL (ref 7.5–12.5)
Platelets: 300 10*3/uL (ref 140–400)
RBC: 3.86 10*6/uL (ref 3.80–5.10)
RDW: 13.4 % (ref 11.0–15.0)
WBC: 5.9 10*3/uL (ref 3.8–10.8)

## 2022-07-06 LAB — SEDIMENTATION RATE: Sed Rate: 6 mm/h (ref 0–30)

## 2022-07-07 DIAGNOSIS — I10 Essential (primary) hypertension: Secondary | ICD-10-CM | POA: Diagnosis not present

## 2022-07-07 DIAGNOSIS — I700 Atherosclerosis of aorta: Secondary | ICD-10-CM | POA: Diagnosis not present

## 2022-07-07 DIAGNOSIS — Z1331 Encounter for screening for depression: Secondary | ICD-10-CM | POA: Diagnosis not present

## 2022-07-07 DIAGNOSIS — N3946 Mixed incontinence: Secondary | ICD-10-CM | POA: Diagnosis not present

## 2022-07-07 DIAGNOSIS — Z1231 Encounter for screening mammogram for malignant neoplasm of breast: Secondary | ICD-10-CM | POA: Diagnosis not present

## 2022-07-07 DIAGNOSIS — I25119 Atherosclerotic heart disease of native coronary artery with unspecified angina pectoris: Secondary | ICD-10-CM | POA: Diagnosis not present

## 2022-07-07 DIAGNOSIS — Z0000 Encounter for general adult medical examination without abnormal findings: Secondary | ICD-10-CM | POA: Diagnosis not present

## 2022-07-07 DIAGNOSIS — E785 Hyperlipidemia, unspecified: Secondary | ICD-10-CM | POA: Diagnosis not present

## 2022-07-07 DIAGNOSIS — E2839 Other primary ovarian failure: Secondary | ICD-10-CM | POA: Diagnosis not present

## 2022-07-07 DIAGNOSIS — Z6820 Body mass index (BMI) 20.0-20.9, adult: Secondary | ICD-10-CM | POA: Diagnosis not present

## 2022-07-14 DIAGNOSIS — I10 Essential (primary) hypertension: Secondary | ICD-10-CM | POA: Diagnosis not present

## 2022-07-14 DIAGNOSIS — N3946 Mixed incontinence: Secondary | ICD-10-CM | POA: Diagnosis not present

## 2022-07-14 DIAGNOSIS — E785 Hyperlipidemia, unspecified: Secondary | ICD-10-CM | POA: Diagnosis not present

## 2022-07-14 DIAGNOSIS — R5383 Other fatigue: Secondary | ICD-10-CM | POA: Diagnosis not present

## 2022-07-14 DIAGNOSIS — Z79899 Other long term (current) drug therapy: Secondary | ICD-10-CM | POA: Diagnosis not present

## 2022-07-18 ENCOUNTER — Other Ambulatory Visit: Payer: Self-pay | Admitting: Cardiology

## 2022-07-21 ENCOUNTER — Other Ambulatory Visit: Payer: Self-pay | Admitting: Cardiology

## 2022-07-21 ENCOUNTER — Encounter: Payer: Self-pay | Admitting: Cardiology

## 2022-07-21 DIAGNOSIS — M810 Age-related osteoporosis without current pathological fracture: Secondary | ICD-10-CM | POA: Diagnosis not present

## 2022-07-21 DIAGNOSIS — Z1231 Encounter for screening mammogram for malignant neoplasm of breast: Secondary | ICD-10-CM | POA: Diagnosis not present

## 2022-07-21 DIAGNOSIS — E2839 Other primary ovarian failure: Secondary | ICD-10-CM | POA: Diagnosis not present

## 2022-07-28 ENCOUNTER — Other Ambulatory Visit: Payer: Self-pay | Admitting: Cardiology

## 2022-07-28 NOTE — Telephone Encounter (Signed)
Rx refill sent to pharmacy. 

## 2022-08-11 DIAGNOSIS — G894 Chronic pain syndrome: Secondary | ICD-10-CM | POA: Diagnosis not present

## 2022-08-11 DIAGNOSIS — Z79891 Long term (current) use of opiate analgesic: Secondary | ICD-10-CM | POA: Diagnosis not present

## 2022-08-11 DIAGNOSIS — M47816 Spondylosis without myelopathy or radiculopathy, lumbar region: Secondary | ICD-10-CM | POA: Diagnosis not present

## 2022-08-11 DIAGNOSIS — M47812 Spondylosis without myelopathy or radiculopathy, cervical region: Secondary | ICD-10-CM | POA: Diagnosis not present

## 2022-08-11 DIAGNOSIS — M25551 Pain in right hip: Secondary | ICD-10-CM | POA: Diagnosis not present

## 2022-08-24 ENCOUNTER — Ambulatory Visit: Payer: Medicare HMO | Admitting: Internal Medicine

## 2022-08-29 ENCOUNTER — Ambulatory Visit: Payer: Medicare HMO | Attending: Cardiology | Admitting: Cardiology

## 2022-08-29 ENCOUNTER — Encounter: Payer: Self-pay | Admitting: Cardiology

## 2022-08-29 VITALS — BP 116/68 | HR 74 | Ht 64.5 in | Wt 125.2 lb

## 2022-08-29 DIAGNOSIS — I2510 Atherosclerotic heart disease of native coronary artery without angina pectoris: Secondary | ICD-10-CM

## 2022-08-29 DIAGNOSIS — R0789 Other chest pain: Secondary | ICD-10-CM

## 2022-08-29 DIAGNOSIS — I5181 Takotsubo syndrome: Secondary | ICD-10-CM | POA: Diagnosis not present

## 2022-08-29 NOTE — Progress Notes (Signed)
Cardiology Office Note:    Date:  08/29/2022   ID:  Kathryn [name], DOB 1955/09/20, MRN [id number]  PCP:  Philemon Kingdom, MD  Cardiologist:  Gypsy Balsam, MD    Referring MD: Philemon Kingdom, MD   Chief Complaint  Patient presents with   Follow-up    History of Present Illness:    Kathryn [name] is a 67 y.o. female with past medical history significant for Takotsubo cardiomyopathy diagnosed in 2019, cardiac catheterization after that showed 50% stenosis of the LAD additional problem include essential hypertension dyslipidemia, ejection fraction normalized.  Last time she was in my office she was complaining of having some atypical chest pain we wanted to do some ischemia workup however she was just after a lot of dental procedures and simply wanted postpone it she comes today to my office for follow-up.  She is doing well.  She described only 2 episodes of chest pain since have seen her last time both happening at rest at the same time she can walk, climb stairs with no difficulties.  Past Medical History:  Diagnosis Date   Anxiety    Arthritis    Atypical chest pain 08/23/2018   Chronic pain 11/02/2017   Cigarette smoker 11/27/2017   Coronary artery disease 50% mid LAD and mid circumflex on cardiac catheterization 2018 11/15/2018   COVID-19 virus infection 08/30/2018   Dyspnea on exertion 08/23/2018   Heart attack (HCC) 10/2017   Infection due to 2019 novel coronavirus 08/28/2018   Formatting of this note might be different from the original. Notification of POS result Isolation since day of testing of patient and family members Local Health Dept will call with specific instructions Conv Care their POC until the [facility] contacts them Obtaining testing for symptomatic Family Members Discuss self-isolation measures Discuss in-home separation and safety measures Discuss web-based C   Mass of right axilla 08/04/2015   Neck pain 10/11/2011   Odontogenic infection of jaw     Personal history of malignant neoplasm of nasal cavities, middle ear, and accessory sinuses 11/10/2011   Pneumonia    Skin cancer (melanoma) (HCC)    Stress incontinence    Takotsubo cardiomyopathy    Tobacco use    UTI (urinary tract infection)     Past Surgical History:  Procedure Laterality Date   ABDOMINAL HYSTERECTOMY     cervical cancer   back skin flap     BACK SURGERY     lumbar and cervical   hernia surgery     X2    LEFT HEART CATH AND CORONARY ANGIOGRAPHY N/A 10/30/2017   Procedure: LEFT HEART CATH AND CORONARY ANGIOGRAPHY;  Surgeon: Runell Gess, MD;  Location: [facility];  Service: Cardiovascular;  Laterality: N/A;   MANDIBLE SURGERY     lower jaw   MELANOMA EXCISION     L arm and L leg, 2014   muscle flap surgery     NECK SURGERY     NOSE SURGERY  2012   Lost her nose due to cancer   SKIN CANCER EXCISION     squamous cell cancer , 2012   WRIST SURGERY      Current Medications: Current Meds  Medication Sig   ALPRAZolam (XANAX) 0.25 MG tablet Take 0.25 mg by mouth at bedtime as needed for anxiety.    amLODipine (NORVASC) 5 MG tablet Take 0.5 tablets (2.5 mg total) by mouth daily.   aspirin EC 81 MG tablet Take 81 mg by mouth daily.  atorvastatin (LIPITOR) 80 MG tablet Take 1 tablet (80 mg total) by mouth daily.   estradiol (ESTRACE) 1 MG tablet Take 1.5 mg by mouth daily.   fluorouracil (EFUDEX) 5 % cream Apply 1 application  topically as needed (pre screening cancer).   furosemide (LASIX) 20 MG tablet TAKE 1 TABLET (20 MG TOTAL) BY MOUTH AS NEEDED FOR FLUID.   isosorbide mononitrate (IMDUR) 30 MG 24 hr tablet Take 1 tablet (30 mg total) by mouth daily.   losartan (COZAAR) 25 MG tablet TAKE 1 TABLET (25 MG TOTAL) BY MOUTH TWO (2) TIMES DAILY. (Patient taking differently: Take 25 mg by mouth daily.)   meloxicam (MOBIC) 15 MG tablet Take 15 mg by mouth daily.   morphine (MS CONTIN) 30 MG 12 hr tablet Take 15 mg by mouth 2 (two) times daily.    mupirocin ointment (BACTROBAN) 2 % Apply 1 Application topically daily.   nitroGLYCERIN (NITROSTAT) 0.4 MG SL tablet Place 1 tablet (0.4 mg total) under the tongue every 5 (five) minutes as needed for chest pain.   oxybutynin (DITROPAN XL) 15 MG 24 hr tablet Take 15 mg by mouth as needed (Bowel or bladder control).   polyethylene glycol (MIRALAX / GLYCOLAX) packet Take 8.6 g by mouth as needed for mild constipation.   ranolazine (RANEXA) 500 MG 12 hr tablet Take 1 tablet (500 mg total) by mouth 2 (two) times daily.   traZODone (DESYREL) 150 MG tablet Take 150 mg by mouth at bedtime.    [DISCONTINUED] Oxycodone HCl 10 MG TABS Take 1 tablet by mouth every four hours as needed for pain stop MS Contin. (Patient taking differently: Take 1 tablet by mouth as needed (pain).)     Allergies:   Cephalosporins   Social History   Socioeconomic History   Marital status: Married    Spouse name: Not on file   Number of children: 2   Years of education: Not on file   Highest education level: Not on file  Occupational History   Not on file  Tobacco Use   Smoking status: Some Days    Packs/day: 0.25    Years: 40.00    Additional pack years: 0.00    Total pack years: 10.00    Types: Cigarettes   Smokeless tobacco: Never  Vaping Use   Vaping Use: Never used  Substance and Sexual Activity   Alcohol use: No   Drug use: No   Sexual activity: Not on file  Other Topics Concern   Not on file  Social History Narrative   Not on file   Social Determinants of Health   Financial Resource Strain: Not on file  Food Insecurity: Not on file  Transportation Needs: Not on file  Physical Activity: Not on file  Stress: Not on file  Social Connections: Not on file     Family History: The patient's family history includes Breast cancer in her mother; COPD in her father; Colon polyps in her mother; Diabetes in her father; Heart attack in her father; Heart disease in her father; Hypertension in her father;  Kidney disease in her father. There is no history of Colon cancer or Esophageal cancer. ROS:   Please see the history of present illness.    All 14 point review of systems negative except as described per history of present illness  EKGs/Labs/Other Studies Reviewed:      Recent Labs: 07/05/2022: BUN 17; Creat 0.86; Hemoglobin 13.1; Platelets 300; Potassium 4.9; Sodium 140  Recent Lipid Panel  Component Value Date/Time   CHOL 138 10/30/2017 0227   TRIG 25 10/30/2017 0227   HDL 69 10/30/2017 0227   CHOLHDL 2.0 10/30/2017 0227   VLDL 5 10/30/2017 0227   LDLCALC 64 10/30/2017 0227    Physical Exam:    VS:  BP 116/68 (BP Location: Left Arm, Patient Position: Sitting)   Pulse 74   Ht 5' 4.5" (1.638 m)   Wt 125 lb 3.2 oz (56.8 kg)   LMP  (LMP Unknown)   SpO2 94%   BMI 21.16 kg/m     Wt Readings from Last 3 Encounters:  08/29/22 125 lb 3.2 oz (56.8 kg)  07/05/22 125 lb (56.7 kg)  06/02/22 128 lb 12.8 oz (58.4 kg)     GEN:  Well nourished, well developed in no acute distress HEENT: Normal NECK: No JVD; No carotid bruits LYMPHATICS: No lymphadenopathy CARDIAC: RRR, no murmurs, no rubs, no gallops RESPIRATORY:  Clear to auscultation without rales, wheezing or rhonchi  ABDOMEN: Soft, non-tender, non-distended MUSCULOSKELETAL:  No edema; No deformity  SKIN: Warm and dry LOWER EXTREMITIES: no swelling NEUROLOGIC:  Alert and oriented x 3 PSYCHIATRIC:  Normal affect   ASSESSMENT:    1. Takotsubo cardiomyopathy   2. Coronary artery disease involving native coronary artery of native heart without angina pectoris   3. Atypical chest pain    PLAN:    In order of problems listed above:  Takotsubo cardiomyopathy.  Normalization last echocardiogram reviewed from year ago doing well continue present management. Coronary disease with 50% mid LAD.  She is have very rare symptoms she prefers to monitor the situation which is fine with me.  Will continue antiplatelet  therapy. Atypical chest pain again very rare episode not related to exercise.  We had a long discussion about what to do with the situation she is talking about potentially having breast surgery for removal of her implants and I will see her to be forwarded on that I will decide if we need to do any ischemia workup based on his symptomatology.   Medication Adjustments/Labs and Tests Ordered: Current medicines are reviewed at length with the patient today.  Concerns regarding medicines are outlined above.  No orders of the defined types were placed in this encounter.  Medication changes: No orders of the defined types were placed in this encounter.   Signed, Georgeanna Lea, MD, [facility] 08/29/2022 2:05 PM    [facility]

## 2022-08-29 NOTE — Patient Instructions (Signed)

## 2022-09-07 ENCOUNTER — Telehealth: Payer: Self-pay

## 2022-09-07 NOTE — Telephone Encounter (Signed)
Received fax refill request for Amoxicillin. Per Dr. Earlene Plater note (4/23) patient should complete 12 weeks of antibiotics around mid June, and follow up to determine if suppressive antibiotics are needed or okay to stop.  Patient scheduled to follow up on 7/8 with Dr. Elinor Parkinson.  Will forward message to provider to advise if okay to send in refill or wait until appointment. Juanita Laster, RMA

## 2022-09-19 ENCOUNTER — Other Ambulatory Visit: Payer: Self-pay

## 2022-09-19 ENCOUNTER — Ambulatory Visit: Payer: Medicare HMO | Admitting: Infectious Diseases

## 2022-09-19 DIAGNOSIS — M272 Inflammatory conditions of jaws: Secondary | ICD-10-CM

## 2022-09-19 MED ORDER — AMOXICILLIN-POT CLAVULANATE 875-125 MG PO TABS
1.0000 | ORAL_TABLET | Freq: Two times a day (BID) | ORAL | 0 refills | Status: DC
Start: 2022-09-19 — End: 2022-10-17

## 2022-10-06 DIAGNOSIS — M47816 Spondylosis without myelopathy or radiculopathy, lumbar region: Secondary | ICD-10-CM | POA: Diagnosis not present

## 2022-10-06 DIAGNOSIS — M25551 Pain in right hip: Secondary | ICD-10-CM | POA: Diagnosis not present

## 2022-10-06 DIAGNOSIS — G894 Chronic pain syndrome: Secondary | ICD-10-CM | POA: Diagnosis not present

## 2022-10-06 DIAGNOSIS — M47812 Spondylosis without myelopathy or radiculopathy, cervical region: Secondary | ICD-10-CM | POA: Diagnosis not present

## 2022-10-16 ENCOUNTER — Other Ambulatory Visit: Payer: Self-pay | Admitting: Cardiology

## 2022-10-17 ENCOUNTER — Encounter: Payer: Self-pay | Admitting: Infectious Disease

## 2022-10-17 ENCOUNTER — Ambulatory Visit: Payer: Medicare HMO | Admitting: Infectious Disease

## 2022-10-17 ENCOUNTER — Other Ambulatory Visit: Payer: Self-pay

## 2022-10-17 VITALS — BP 99/64 | HR 67 | Temp 98.2°F

## 2022-10-17 DIAGNOSIS — M272 Inflammatory conditions of jaws: Secondary | ICD-10-CM | POA: Diagnosis not present

## 2022-10-17 DIAGNOSIS — T847XXA Infection and inflammatory reaction due to other internal orthopedic prosthetic devices, implants and grafts, initial encounter: Secondary | ICD-10-CM | POA: Insufficient documentation

## 2022-10-17 DIAGNOSIS — T847XXD Infection and inflammatory reaction due to other internal orthopedic prosthetic devices, implants and grafts, subsequent encounter: Secondary | ICD-10-CM

## 2022-10-17 HISTORY — DX: Infection and inflammatory reaction due to other internal orthopedic prosthetic devices, implants and grafts, initial encounter: T84.7XXA

## 2022-10-17 LAB — CBC WITH DIFFERENTIAL/PLATELET
Absolute Monocytes: 312 cells/uL (ref 200–950)
Basophils Absolute: 59 cells/uL (ref 0–200)
Basophils Relative: 0.9 %
Eosinophils Absolute: 572 cells/uL — ABNORMAL HIGH (ref 15–500)
Eosinophils Relative: 8.8 %
HCT: 36.4 % (ref 35.0–45.0)
Hemoglobin: 12.4 g/dL (ref 11.7–15.5)
Lymphs Abs: 2867 cells/uL (ref 850–3900)
MCH: 35 pg — ABNORMAL HIGH (ref 27.0–33.0)
MCHC: 34.1 g/dL (ref 32.0–36.0)
MCV: 102.8 fL — ABNORMAL HIGH (ref 80.0–100.0)
MPV: 10.5 fL (ref 7.5–12.5)
Monocytes Relative: 4.8 %
Neutro Abs: 2691 cells/uL (ref 1500–7800)
Neutrophils Relative %: 41.4 %
Platelets: 251 10*3/uL (ref 140–400)
RBC: 3.54 10*6/uL — ABNORMAL LOW (ref 3.80–5.10)
RDW: 12 % (ref 11.0–15.0)
Total Lymphocyte: 44.1 %
WBC: 6.5 10*3/uL (ref 3.8–10.8)

## 2022-10-17 MED ORDER — AMOXICILLIN-POT CLAVULANATE 875-125 MG PO TABS
1.0000 | ORAL_TABLET | Freq: Two times a day (BID) | ORAL | 11 refills | Status: DC
Start: 2022-10-17 — End: 2023-02-15

## 2022-10-17 NOTE — Progress Notes (Signed)
Patient hypotensive in clinic (77/51). Reports she's been dizzy recently, but thinks this is due to medication side effect and is working with PCP on this. States she has blurry vision, but that this is not acute and has been present since cataract surgery. Re-checked BP after labs, up to 99/64, she reports she feels stable and is able to ambulate to the front desk without assistance.   Sandie Ano, RN

## 2022-10-17 NOTE — Progress Notes (Signed)
Subjective:    Chief complaint: Follow-up for mandibular osteomyelitis   Patient ID: Kathryn [name], female    DOB: December 18, 1955, 67 y.o.   MRN: [id number]  HPI  Kathryn [name] is a 67 year old with left mandibular surgical placement of a dental implant on 02/08/2022.  She initially did well, however, presented on 03/11/2022 with left facial swelling and pain.  The dental implant had become infected and was removed.  She healed well, however, presented again to her dentist on 05/16/2022 with recurrent facial swelling and mandibular pain.  A CT scan revealed findings consistent with osteomyelitis of the left mandible with associated possible pathologic fracture.  She was prescribed clindamycin 67 mg 3 times daily.  However, she had minimal improvement in her symptoms.  She underwent another debridement of the area with fixation of the fracture on 05/24/2022 he had a metal plate placed at time of debridement. .  Cultures obtained from the left mandible notable for rare Staphylococcus epidermidis (oxacillin sensitive) and rare Streptococcus intermedius (susceptibilities not done). She was treated with amoxicillin and nodule followed by Augmentin and follow-up with my former partner Dr. Earlene Plater.  She seemed respond well to antibiotics.  Her inflammatory markers were notably normal prior to initiation of antibiotics.  She says that she does not have any pain anymore she does have a "knot on the corner of her jaw that was where this area of osteomyelitis was quite profoundly enlarged and photos that she showed me showed me previously.     Past Medical History:  Diagnosis Date   Anxiety    Arthritis    Atypical chest pain 08/23/2018   Chronic pain 11/02/2017   Cigarette smoker 11/27/2017   Coronary artery disease 50% mid LAD and mid circumflex on cardiac catheterization 2018 11/15/2018   COVID-19 virus infection 08/30/2018   Dyspnea on exertion 08/23/2018   Heart attack (HCC) 10/2017   Infection due to 2019  novel coronavirus 08/28/2018   Formatting of this note might be different from the original. Notification of POS result Isolation since day of testing of patient and family members Local Health Dept will call with specific instructions Conv Care their POC until the [facility] contacts them Obtaining testing for symptomatic Family Members Discuss self-isolation measures Discuss in-home separation and safety measures Discuss web-based C   Mass of right axilla 08/04/2015   Neck pain 10/11/2011   Odontogenic infection of jaw    Personal history of malignant neoplasm of nasal cavities, middle ear, and accessory sinuses 11/10/2011   Pneumonia    Skin cancer (melanoma) (HCC)    Stress incontinence    Takotsubo cardiomyopathy    Tobacco use    UTI (urinary tract infection)     Past Surgical History:  Procedure Laterality Date   ABDOMINAL HYSTERECTOMY     cervical cancer   back skin flap     BACK SURGERY     lumbar and cervical   hernia surgery     X2    LEFT HEART CATH AND CORONARY ANGIOGRAPHY N/A 10/30/2017   Procedure: LEFT HEART CATH AND CORONARY ANGIOGRAPHY;  Surgeon: Runell Gess, MD;  Location: [facility];  Service: Cardiovascular;  Laterality: N/A;   MANDIBLE SURGERY     lower jaw   MELANOMA EXCISION     L arm and L leg, 2014   muscle flap surgery     NECK SURGERY     NOSE SURGERY  2012   Lost her nose due to cancer  SKIN CANCER EXCISION     squamous cell cancer , 2012   WRIST SURGERY      Family History  Problem Relation Age of Onset   Breast cancer Mother        said all her sisters too   Colon polyps Mother    Heart disease Father    Diabetes Father    Kidney disease Father        stage 3   COPD Father    Heart attack Father    Hypertension Father    Colon cancer Neg Hx    Esophageal cancer Neg Hx       Social History   Socioeconomic History   Marital status: Married    Spouse name: Not on file   Number of children: 2   Years of education: Not on  file   Highest education level: Not on file  Occupational History   Not on file  Tobacco Use   Smoking status: Some Days    Current packs/day: 0.25    Average packs/day: 0.3 packs/day for 40.0 years (10.0 ttl pk-yrs)    Types: Cigarettes   Smokeless tobacco: Never  Vaping Use   Vaping status: Never Used  Substance and Sexual Activity   Alcohol use: No   Drug use: No   Sexual activity: Not on file  Other Topics Concern   Not on file  Social History Narrative   Not on file   Social Determinants of Health   Financial Resource Strain: Not on file  Food Insecurity: Not on file  Transportation Needs: Not on file  Physical Activity: Not on file  Stress: Not on file  Social Connections: Not on file    Allergies  Allergen Reactions   Cephalosporins Rash     Current Outpatient Medications:    ALPRAZolam (XANAX) 0.25 MG tablet, Take 0.25 mg by mouth at bedtime as needed for anxiety. , Disp: , Rfl:    amLODipine (NORVASC) 5 MG tablet, Take 0.5 tablets (2.5 mg total) by mouth daily., Disp: 90 tablet, Rfl: 3   amoxicillin-clavulanate (AUGMENTIN) 875-125 MG tablet, Take 1 tablet by mouth 2 (two) times daily., Disp: 60 tablet, Rfl: 0   aspirin EC 81 MG tablet, Take 81 mg by mouth daily., Disp: , Rfl:    atorvastatin (LIPITOR) 80 MG tablet, Take 1 tablet (80 mg total) by mouth daily., Disp: 90 tablet, Rfl: 2   estradiol (ESTRACE) 1 MG tablet, Take 1.5 mg by mouth daily., Disp: , Rfl:    fluorouracil (EFUDEX) 5 % cream, Apply 1 application  topically as needed (pre screening cancer)., Disp: , Rfl:    furosemide (LASIX) 20 MG tablet, TAKE 1 TABLET (20 MG TOTAL) BY MOUTH AS NEEDED FOR FLUID., Disp: 90 tablet, Rfl: 0   isosorbide mononitrate (IMDUR) 30 MG 24 hr tablet, Take 1 tablet (30 mg total) by mouth daily., Disp: 90 tablet, Rfl: 3   losartan (COZAAR) 25 MG tablet, TAKE 1 TABLET (25 MG TOTAL) BY MOUTH TWO (2) TIMES DAILY. (Patient taking differently: Take 25 mg by mouth daily.), Disp:  180 tablet, Rfl: 3   meloxicam (MOBIC) 15 MG tablet, Take 15 mg by mouth daily., Disp: , Rfl:    morphine (MS CONTIN) 30 MG 12 hr tablet, Take 15 mg by mouth 2 (two) times daily., Disp: , Rfl:    mupirocin ointment (BACTROBAN) 2 %, Apply 1 Application topically daily., Disp: , Rfl:    nitroGLYCERIN (NITROSTAT) 0.4 MG SL tablet, Place 1  tablet (0.4 mg total) under the tongue every 5 (five) minutes as needed for chest pain., Disp: 25 tablet, Rfl: 6   oxybutynin (DITROPAN XL) 15 MG 24 hr tablet, Take 15 mg by mouth as needed (Bowel or bladder control)., Disp: , Rfl:    polyethylene glycol (MIRALAX / GLYCOLAX) packet, Take 8.6 g by mouth as needed for mild constipation., Disp: , Rfl:    ranolazine (RANEXA) 500 MG 12 hr tablet, Take 1 tablet (500 mg total) by mouth 2 (two) times daily., Disp: 180 tablet, Rfl: 2   traZODone (DESYREL) 150 MG tablet, Take 150 mg by mouth at bedtime. , Disp: , Rfl:     Review of Systems  Constitutional:  Negative for activity change, appetite change, chills, diaphoresis, fatigue, fever and unexpected weight change.  HENT:  Negative for congestion, rhinorrhea, sinus pressure, sneezing, sore throat and trouble swallowing.   Eyes:  Negative for photophobia and visual disturbance.  Respiratory:  Negative for cough, chest tightness, shortness of breath, wheezing and stridor.   Cardiovascular:  Negative for chest pain, palpitations and leg swelling.  Gastrointestinal:  Negative for abdominal distention, abdominal pain, anal bleeding, blood in stool, constipation, diarrhea, nausea and vomiting.  Genitourinary:  Negative for difficulty urinating, dysuria, flank pain and hematuria.  Musculoskeletal:  Negative for arthralgias, back pain, gait problem, joint swelling and myalgias.  Skin:  Negative for color change, pallor, rash and wound.  Neurological:  Negative for dizziness, tremors, weakness and light-headedness.  Hematological:  Negative for adenopathy. Does not bruise/bleed  easily.  Psychiatric/Behavioral:  Negative for agitation, behavioral problems, confusion, decreased concentration, dysphoric mood and sleep disturbance.        Objective:   Physical Exam Constitutional:      General: She is not in acute distress.    Appearance: Normal appearance. She is well-developed. She is not ill-appearing or diaphoretic.  HENT:     Head: Normocephalic and atraumatic.     Right Ear: Hearing and external ear normal.     Left Ear: Hearing and external ear normal.     Nose: No nasal deformity or rhinorrhea.     Mouth/Throat:     Mouth: Mucous membranes are moist. Mucous membranes are pale.     Dentition: Abnormal dentition.     Comments: Implant in lower jaw visible as well as posterior area where one was removed Eyes:     General: No scleral icterus.    Conjunctiva/sclera: Conjunctivae normal.     Right eye: Right conjunctiva is not injected.     Left eye: Left conjunctiva is not injected.     Pupils: Pupils are equal, round, and reactive to light.  Neck:     Vascular: No JVD.     Comments: Solid area along her chin Cardiovascular:     Rate and Rhythm: Normal rate and regular rhythm.     Heart sounds: Normal heart sounds, S1 normal and S2 normal. No murmur heard.    No friction rub.  Abdominal:     General: Bowel sounds are normal. There is no distension.     Palpations: Abdomen is soft.     Tenderness: There is no abdominal tenderness.  Musculoskeletal:        General: Normal range of motion.     Right shoulder: Normal.     Left shoulder: Normal.     Cervical back: Normal range of motion.     Right hip: Normal.     Left hip: Normal.  Right knee: Normal.     Left knee: Normal.  Lymphadenopathy:     Head:     Right side of head: No submandibular, preauricular or posterior auricular adenopathy.     Left side of head: No submandibular, preauricular or posterior auricular adenopathy.     Cervical: No cervical adenopathy.     Right cervical: No  superficial or deep cervical adenopathy.    Left cervical: No superficial or deep cervical adenopathy.  Skin:    General: Skin is warm and dry.     Coloration: Skin is not pale.     Findings: No abrasion, bruising, ecchymosis, erythema, lesion or rash.     Nails: There is no clubbing.  Neurological:     Mental Status: She is alert and oriented to person, place, and time.     Sensory: No sensory deficit.     Coordination: Coordination normal.     Gait: Gait normal.  Psychiatric:        Attention and Perception: She is attentive.        Mood and Affect: Mood normal.        Speech: Speech normal.        Behavior: Behavior normal. Behavior is cooperative.        Thought Content: Thought content normal.        Judgment: Judgment normal.           Assessment & Plan:   Osteomyelitis of the mandible complicated by placement of hardware:  Will check sed rate CRP CMP and CBC differential  Continue her Augmentin rx and push for a year.  I will see her back in roughly 4 months time.  I have personally spent 26 minutes involved in face-to-face and non-face-to-face activities for this patient on the day of the visit. Professional time spent includes the following activities: Preparing to see the patient (review of tests), Obtaining and/or reviewing separately obtained history (admission/discharge record), Performing a medically appropriate examination and/or evaluation , Ordering medications/tests/procedures, referring and communicating with other health care professionals, Documenting clinical information in the EMR, Independently interpreting results (not separately reported), Communicating results to the patient/family/caregiver, Counseling and educating the patient/family/caregiver and Care coordination (not separately reported).

## 2022-10-19 DIAGNOSIS — I951 Orthostatic hypotension: Secondary | ICD-10-CM | POA: Diagnosis not present

## 2022-10-19 DIAGNOSIS — Z6820 Body mass index (BMI) 20.0-20.9, adult: Secondary | ICD-10-CM | POA: Diagnosis not present

## 2022-10-19 DIAGNOSIS — M869 Osteomyelitis, unspecified: Secondary | ICD-10-CM | POA: Diagnosis not present

## 2022-10-19 DIAGNOSIS — R739 Hyperglycemia, unspecified: Secondary | ICD-10-CM | POA: Diagnosis not present

## 2022-10-19 DIAGNOSIS — L989 Disorder of the skin and subcutaneous tissue, unspecified: Secondary | ICD-10-CM | POA: Diagnosis not present

## 2022-10-19 DIAGNOSIS — N3946 Mixed incontinence: Secondary | ICD-10-CM | POA: Diagnosis not present

## 2022-10-31 ENCOUNTER — Other Ambulatory Visit: Payer: Self-pay

## 2022-10-31 MED ORDER — AMLODIPINE BESYLATE 5 MG PO TABS: 2.5000 mg | ORAL_TABLET | Freq: Every day | ORAL | 2 refills | Status: DC

## 2022-10-31 MED ORDER — RANOLAZINE ER 500 MG PO TB12: 500.0000 mg | ORAL_TABLET | Freq: Two times a day (BID) | ORAL | 2 refills | Status: DC

## 2022-12-01 DIAGNOSIS — M25551 Pain in right hip: Secondary | ICD-10-CM | POA: Diagnosis not present

## 2022-12-01 DIAGNOSIS — M47812 Spondylosis without myelopathy or radiculopathy, cervical region: Secondary | ICD-10-CM | POA: Diagnosis not present

## 2022-12-01 DIAGNOSIS — M47816 Spondylosis without myelopathy or radiculopathy, lumbar region: Secondary | ICD-10-CM | POA: Diagnosis not present

## 2022-12-01 DIAGNOSIS — G894 Chronic pain syndrome: Secondary | ICD-10-CM | POA: Diagnosis not present

## 2022-12-08 ENCOUNTER — Encounter: Payer: Self-pay | Admitting: Cardiology

## 2022-12-08 ENCOUNTER — Ambulatory Visit: Payer: Medicare HMO | Attending: Cardiology | Admitting: Cardiology

## 2022-12-08 VITALS — BP 126/84 | HR 75 | Ht 64.5 in | Wt 129.4 lb

## 2022-12-08 DIAGNOSIS — I5181 Takotsubo syndrome: Secondary | ICD-10-CM | POA: Diagnosis not present

## 2022-12-08 DIAGNOSIS — I2510 Atherosclerotic heart disease of native coronary artery without angina pectoris: Secondary | ICD-10-CM

## 2022-12-08 DIAGNOSIS — R0609 Other forms of dyspnea: Secondary | ICD-10-CM

## 2022-12-08 NOTE — Addendum Note (Signed)
Addended by: Baldo Ash D on: 12/08/2022 02:40 PM   Modules accepted: Orders

## 2022-12-08 NOTE — Patient Instructions (Signed)
Medication Instructions:  Your physician recommends that you continue on your current medications as directed. Please refer to the Current Medication list given to you today.  *If you need a refill on your cardiac medications before your next appointment, please call your pharmacy*   Lab Work: None Ordered If you have labs (blood work) drawn today and your tests are completely normal, you will receive your results only by: MyChart Message (if you have MyChart) OR A paper copy in the mail If you have any lab test that is abnormal or we need to change your treatment, we will call you to review the results.   Testing/Procedures: Your physician has requested that you have an echocardiogram. Echocardiography is a painless test that uses sound waves to create images of your heart. It provides your doctor with information about the size and shape of your heart and how well your heart's chambers and valves are working. This procedure takes approximately one hour. There are no restrictions for this procedure. Please do NOT wear cologne, perfume, aftershave, or lotions (deodorant is allowed). Please arrive 15 minutes prior to your appointment time.      Follow-Up: At [facility], you and your health needs are our priority.  As part of our continuing mission to provide you with exceptional heart care, we have created designated Provider Care Teams.  These Care Teams include your primary Cardiologist (physician) and Advanced Practice Providers (APPs -  Physician Assistants and Nurse Practitioners) who all work together to provide you with the care you need, when you need it.  We recommend signing up for the patient portal called "MyChart".  Sign up information is provided on this After Visit Summary.  MyChart is used to connect with patients for Virtual Visits (Telemedicine).  Patients are able to view lab/test results, encounter notes, upcoming appointments, etc.  Non-urgent messages can be sent to  your provider as well.   To learn more about what you can do with MyChart, go to [email or web address].    Your next appointment:   6 month(s)  The format for your next appointment:   In Person  Provider:   Gypsy Balsam, MD    Other Instructions NA

## 2022-12-08 NOTE — Progress Notes (Signed)
Cardiology Office Note:    Date:  12/08/2022   ID:  Kathryn [name], DOB 1955/08/03, MRN [id number]  PCP:  Kathryn Kingdom, MD  Cardiologist:  Kathryn Balsam, MD    Referring MD: Kathryn Kingdom, MD   Chief Complaint  Patient presents with   CLearance  TBD    Patient instructed to have surgeon fax clearance request Dr. Ivin Booty Removal of Implant [phone]    History of Present Illness:    Kathryn [name] is a 67 y.o. female with past medical history significant for coronary artery disease she did have coronary CT angio done in January which showed mid LAD lesion which is borderline significance.  She denies have any typical symptoms.  She described to have 1 episode of chest pain since I seen her last time interesting it happens at rest at the same time she is able to compensate walk and difficulties.  She tells me that every day she climbs about 7 flight of stairs with no problem she get a little short of breath but otherwise no issue described to have some swelling of lower extremities which seems to be a little worse lately.  Past Medical History:  Diagnosis Date   Anxiety    Arthritis    Atypical chest pain 08/23/2018   Chronic pain 11/02/2017   Cigarette smoker 11/27/2017   Coronary artery disease 50% mid LAD and mid circumflex on cardiac catheterization 2018 11/15/2018   COVID-19 virus infection 08/30/2018   Dyspnea on exertion 08/23/2018   Hardware complicating wound infection (HCC) 10/17/2022   Heart attack (HCC) 10/2017   Infection due to 2019 novel coronavirus 08/28/2018   Formatting of this note might be different from the original. Notification of POS result Isolation since day of testing of patient and family members Local Health Dept will call with specific instructions Conv Care their POC until the [facility] contacts them Obtaining testing for symptomatic Family Members Discuss self-isolation measures Discuss in-home separation and safety measures  Discuss web-based C   Mass of right axilla 08/04/2015   Neck pain 10/11/2011   Odontogenic infection of jaw    Personal history of malignant neoplasm of nasal cavities, middle ear, and accessory sinuses 11/10/2011   Pneumonia    Skin cancer (melanoma) (HCC)    Stress incontinence    Takotsubo cardiomyopathy    Tobacco use    UTI (urinary tract infection)     Past Surgical History:  Procedure Laterality Date   ABDOMINAL HYSTERECTOMY     cervical cancer   back skin flap     BACK SURGERY     lumbar and cervical   hernia surgery     X2    LEFT HEART CATH AND CORONARY ANGIOGRAPHY N/A 10/30/2017   Procedure: LEFT HEART CATH AND CORONARY ANGIOGRAPHY;  Surgeon: Runell Gess, MD;  Location: [facility] INVASIVE CV LAB;  Service: Cardiovascular;  Laterality: N/A;   MANDIBLE SURGERY     lower jaw   MELANOMA EXCISION     L arm and L leg, 2014   muscle flap surgery     NECK SURGERY     NOSE SURGERY  2012   Lost her nose due to cancer   SKIN CANCER EXCISION     squamous cell cancer , 2012   WRIST SURGERY      Current Medications: Current Meds  Medication Sig   ALPRAZolam (XANAX) 0.25 MG tablet Take 0.25 mg by mouth at bedtime as needed for anxiety.    amLODipine (  NORVASC) 5 MG tablet Take 0.5 tablets (2.5 mg total) by mouth daily.   amoxicillin-clavulanate (AUGMENTIN) 875-125 MG tablet Take 1 tablet by mouth 2 (two) times daily.   aspirin EC 81 MG tablet Take 81 mg by mouth daily.   atorvastatin (LIPITOR) 80 MG tablet Take 1 tablet (80 mg total) by mouth daily.   Celecoxib (CELEBREX PO) Take 1 tablet by mouth 2 (two) times daily.   estradiol (ESTRACE) 1 MG tablet Take 1.5 mg by mouth daily.   fluorouracil (EFUDEX) 5 % cream Apply 1 application  topically as needed (pre screening cancer).   furosemide (LASIX) 20 MG tablet TAKE 1 TABLET (20 MG TOTAL) BY MOUTH AS NEEDED FOR FLUID.   isosorbide mononitrate (IMDUR) 30 MG 24 hr tablet Take 1 tablet (30 mg total) by mouth daily.   losartan  (COZAAR) 25 MG tablet TAKE 1 TABLET (25 MG TOTAL) BY MOUTH TWO (2) TIMES DAILY. (Patient taking differently: Take 25 mg by mouth daily.)   morphine (MS CONTIN) 30 MG 12 hr tablet Take 15 mg by mouth 2 (two) times daily.   mupirocin ointment (BACTROBAN) 2 % Apply 1 Application topically daily.   nitroGLYCERIN (NITROSTAT) 0.4 MG SL tablet Place 1 tablet (0.4 mg total) under the tongue every 5 (five) minutes as needed for chest pain.   polyethylene glycol (MIRALAX / GLYCOLAX) packet Take 8.6 g by mouth as needed for mild constipation.   ranolazine (RANEXA) 500 MG 12 hr tablet Take 1 tablet (500 mg total) by mouth 2 (two) times daily.   traZODone (DESYREL) 150 MG tablet Take 150 mg by mouth at bedtime.    [DISCONTINUED] meloxicam (MOBIC) 15 MG tablet Take 15 mg by mouth daily.   [DISCONTINUED] oxybutynin (DITROPAN XL) 15 MG 24 hr tablet Take 15 mg by mouth as needed (Bowel or bladder control).     Allergies:   Cephalosporins   Social History   Socioeconomic History   Marital status: Married    Spouse name: Not on file   Number of children: 2   Years of education: Not on file   Highest education level: Not on file  Occupational History   Not on file  Tobacco Use   Smoking status: Some Days    Current packs/day: 0.25    Average packs/day: 0.3 packs/day for 40.0 years (10.0 ttl pk-yrs)    Types: Cigarettes   Smokeless tobacco: Never  Vaping Use   Vaping status: Never Used  Substance and Sexual Activity   Alcohol use: No   Drug use: No   Sexual activity: Not on file  Other Topics Concern   Not on file  Social History Narrative   Not on file   Social Determinants of Health   Financial Resource Strain: Not on file  Food Insecurity: Not on file  Transportation Needs: Not on file  Physical Activity: Not on file  Stress: Not on file  Social Connections: Not on file     Family History: The patient's family history includes Breast cancer in her mother; COPD in her father; Colon  polyps in her mother; Diabetes in her father; Heart attack in her father; Heart disease in her father; Hypertension in her father; Kidney disease in her father. There is no history of Colon cancer or Esophageal cancer. ROS:   Please see the history of present illness.    All 14 point review of systems negative except as described per history of present illness  EKGs/Labs/Other Studies Reviewed:    EKG  Interpretation Date/Time:  Thursday December 08 2022 14:16:38 EDT Ventricular Rate:  75 PR Interval:  174 QRS Duration:  84 QT Interval:  396 QTC Calculation: 442 R Axis:   74  Text Interpretation: Normal sinus rhythm Normal ECG When compared with ECG of 31-Oct-2017 06:53, T wave inversion no longer evident in Inferior leads T wave inversion no longer evident in Lateral leads Confirmed by Kathryn [name] [phone]) on 12/08/2022 2:22:54 PM    Recent Labs: 10/17/2022: ALT 15; BUN 16; Creat 0.86; Hemoglobin 12.4; Platelets 251; Potassium 4.4; Sodium 140  Recent Lipid Panel    Component Value Date/Time   CHOL 138 10/30/2017 0227   TRIG 25 10/30/2017 0227   HDL 69 10/30/2017 0227   CHOLHDL 2.0 10/30/2017 0227   VLDL 5 10/30/2017 0227   LDLCALC 64 10/30/2017 0227    Physical Exam:    VS:  BP 126/84 (BP Location: Left Arm, Patient Position: Sitting)   Pulse 75   Ht 5' 4.5" (1.638 m)   Wt 129 lb 6.4 oz (58.7 kg)   LMP  (LMP Unknown)   SpO2 97%   BMI 21.87 kg/m     Wt Readings from Last 3 Encounters:  12/08/22 129 lb 6.4 oz (58.7 kg)  08/29/22 125 lb 3.2 oz (56.8 kg)  07/05/22 125 lb (56.7 kg)     GEN:  Well nourished, well developed in no acute distress HEENT: Normal NECK: No JVD; No carotid bruits LYMPHATICS: No lymphadenopathy CARDIAC: RRR, no murmurs, no rubs, no gallops RESPIRATORY:  Clear to auscultation without rales, wheezing or rhonchi  ABDOMEN: Soft, non-tender, non-distended MUSCULOSKELETAL:  No edema; No deformity  SKIN: Warm and dry LOWER EXTREMITIES: no  swelling NEUROLOGIC:  Alert and oriented x 3 PSYCHIATRIC:  Normal affect   ASSESSMENT:    1. Coronary artery disease involving native coronary artery of native heart without angina pectoris   2. Takotsubo cardiomyopathy   3. Dyspnea on exertion    PLAN:    In order of problems listed above:  Coronary disease borderline lesion in mid LAD.  Good exercise tolerance easily can do 4 METS.  Therefore in terms of evaluation before surgery I think should be fine to proceed with surgery with no reservations. History of Takotsubo described to have some more shortness of breath recently with some swelling of lower extremities.  Will schedule her to have ultrasounds of her heart make sure her heart is strong pumping blood well. Dyspnea exertion plan as described above. Dyslipidemia she is taking Lipitor 80 which I will continue I did review K PN which show me her LDL of 35 HDL 87 continue present management   Medication Adjustments/Labs and Tests Ordered: Current medicines are reviewed at length with the patient today.  Concerns regarding medicines are outlined above.  Orders Placed This Encounter  Procedures   EKG 12-Lead   Medication changes: No orders of the defined types were placed in this encounter.   Signed, Georgeanna Lea, MD, [facility] 12/08/2022 2:34 PM    [facility]

## 2022-12-26 ENCOUNTER — Other Ambulatory Visit: Payer: Self-pay | Admitting: Cardiology

## 2023-01-12 ENCOUNTER — Ambulatory Visit: Payer: Medicare HMO | Attending: Cardiology

## 2023-01-12 DIAGNOSIS — R0609 Other forms of dyspnea: Secondary | ICD-10-CM | POA: Diagnosis not present

## 2023-01-12 LAB — ECHOCARDIOGRAM COMPLETE
Area-P 1/2: 3.84 cm2
S' Lateral: 2.6 cm

## 2023-01-17 ENCOUNTER — Telehealth: Payer: Self-pay

## 2023-01-17 NOTE — Telephone Encounter (Signed)
Left message on My Chart with normal Echo results per Dr. Krasowski's note. Routed to PCP.  

## 2023-01-19 ENCOUNTER — Telehealth: Payer: Self-pay

## 2023-01-19 NOTE — Telephone Encounter (Signed)
Echo Results reviewed with pt as per Dr. Krasowski's note.  Pt verbalized understanding and had no additional questions. Routed to PCP 

## 2023-01-25 DIAGNOSIS — M47816 Spondylosis without myelopathy or radiculopathy, lumbar region: Secondary | ICD-10-CM | POA: Diagnosis not present

## 2023-01-25 DIAGNOSIS — M25551 Pain in right hip: Secondary | ICD-10-CM | POA: Diagnosis not present

## 2023-01-25 DIAGNOSIS — Z79899 Other long term (current) drug therapy: Secondary | ICD-10-CM | POA: Diagnosis not present

## 2023-01-25 DIAGNOSIS — M47812 Spondylosis without myelopathy or radiculopathy, cervical region: Secondary | ICD-10-CM | POA: Diagnosis not present

## 2023-01-25 DIAGNOSIS — G894 Chronic pain syndrome: Secondary | ICD-10-CM | POA: Diagnosis not present

## 2023-02-15 ENCOUNTER — Encounter: Payer: Self-pay | Admitting: Infectious Disease

## 2023-02-15 ENCOUNTER — Other Ambulatory Visit: Payer: Self-pay

## 2023-02-15 ENCOUNTER — Ambulatory Visit: Payer: Medicare HMO | Admitting: Infectious Disease

## 2023-02-15 VITALS — BP 131/80 | HR 79 | Temp 97.5°F | Wt 129.0 lb

## 2023-02-15 DIAGNOSIS — M272 Inflammatory conditions of jaws: Secondary | ICD-10-CM

## 2023-02-15 DIAGNOSIS — T847XXD Infection and inflammatory reaction due to other internal orthopedic prosthetic devices, implants and grafts, subsequent encounter: Secondary | ICD-10-CM | POA: Diagnosis not present

## 2023-02-15 DIAGNOSIS — Z7185 Encounter for immunization safety counseling: Secondary | ICD-10-CM

## 2023-02-15 MED ORDER — AMOXICILLIN-POT CLAVULANATE 875-125 MG PO TABS: 1.0000 | ORAL_TABLET | Freq: Two times a day (BID) | ORAL | 11 refills | Status: DC

## 2023-02-15 NOTE — Progress Notes (Signed)
Subjective:  Complaint follow-up for mandibular osteomyelitis on antibiotics  Patient ID: Kathryn [name], female    DOB: 1955/04/07, 67 y.o.   MRN: [id number]  HPI   67 year old with left mandibular surgical placement of a dental implant on 02/08/2022.  She initially did well, however, presented on 03/11/2022 with left facial swelling and pain.  The dental implant had become infected and was removed.  She healed well, however, presented again to her dentist on 05/16/2022 with recurrent facial swelling and mandibular pain.  A CT scan revealed findings consistent with osteomyelitis of the left mandible with associated possible pathologic fracture.  She was prescribed clindamycin 300 mg 3 times daily.  However, she had minimal improvement in her symptoms.  She underwent another debridement of the area with fixation of the fracture on 05/24/2022 he had a metal plate placed at time of debridement. .  Cultures obtained from the left mandible notable for rare Staphylococcus epidermidis (oxacillin sensitive) and rare Streptococcus intermedius notable to all antibiotics except for erythromycin. She was treated with amoxicillin,  followed by Augmentin and follow-up with my former partner Dr. Earlene Plater.  She seemed respond well to antibiotics.  Her inflammatory markers were notably normal prior to initiation of antibiotics.   She says at last visit that she does not have any pain anymore she does have a "knot on the corner of her jaw that was where this area of osteomyelitis was quite profoundly enlarged and photos that she showed me showed me previously.  Her ENT physician told her that he believes that this area is due to excessive calcium deposits that have occurred.  She is in need of further surgery to stabilize her jaw and this is going to be planned for January she is tolerating the Augmentin without difficulty.    Past Medical History:  Diagnosis Date   Anxiety    Arthritis    Atypical chest pain  08/23/2018   Chronic pain 11/02/2017   Cigarette smoker 11/27/2017   Coronary artery disease 50% mid LAD and mid circumflex on cardiac catheterization 2018 11/15/2018   COVID-19 virus infection 08/30/2018   Dyspnea on exertion 08/23/2018   Hardware complicating wound infection (HCC) 10/17/2022   Heart attack (HCC) 10/2017   Infection due to 2019 novel coronavirus 08/28/2018   Formatting of this note might be different from the original. Notification of POS result Isolation since day of testing of patient and family members Local Health Dept will call with specific instructions Conv Care their POC until the [facility] contacts them Obtaining testing for symptomatic Family Members Discuss self-isolation measures Discuss in-home separation and safety measures Discuss web-based C   Mass of right axilla 08/04/2015   Neck pain 10/11/2011   Odontogenic infection of jaw    Personal history of malignant neoplasm of nasal cavities, middle ear, and accessory sinuses 11/10/2011   Pneumonia    Skin cancer (melanoma) (HCC)    Stress incontinence    Takotsubo cardiomyopathy    Tobacco use    UTI (urinary tract infection)     Past Surgical History:  Procedure Laterality Date   ABDOMINAL HYSTERECTOMY     cervical cancer   back skin flap     BACK SURGERY     lumbar and cervical   hernia surgery     X2    LEFT HEART CATH AND CORONARY ANGIOGRAPHY N/A 10/30/2017   Procedure: LEFT HEART CATH AND CORONARY ANGIOGRAPHY;  Surgeon: Runell Gess, MD;  Location: [facility] INVASIVE  CV LAB;  Service: Cardiovascular;  Laterality: N/A;   MANDIBLE SURGERY     lower jaw   MELANOMA EXCISION     L arm and L leg, 2014   muscle flap surgery     NECK SURGERY     NOSE SURGERY  2012   Lost her nose due to cancer   SKIN CANCER EXCISION     squamous cell cancer , 2012   WRIST SURGERY      Family History  Problem Relation Age of Onset   Breast cancer Mother        said all her sisters too   Colon polyps Mother    Heart  disease Father    Diabetes Father    Kidney disease Father        stage 3   COPD Father    Heart attack Father    Hypertension Father    Colon cancer Neg Hx    Esophageal cancer Neg Hx       Social History   Socioeconomic History   Marital status: Married    Spouse name: Not on file   Number of children: 2   Years of education: Not on file   Highest education level: Not on file  Occupational History   Not on file  Tobacco Use   Smoking status: Some Days    Current packs/day: 0.25    Average packs/day: 0.3 packs/day for 40.0 years (10.0 ttl pk-yrs)    Types: Cigarettes   Smokeless tobacco: Never  Vaping Use   Vaping status: Never Used  Substance and Sexual Activity   Alcohol use: No   Drug use: No   Sexual activity: Not on file  Other Topics Concern   Not on file  Social History Narrative   Not on file   Social Determinants of Health   Financial Resource Strain: Not on file  Food Insecurity: Not on file  Transportation Needs: Not on file  Physical Activity: Not on file  Stress: Not on file  Social Connections: Not on file    Allergies  Allergen Reactions   Cephalosporins Rash     Current Outpatient Medications:    ALPRAZolam (XANAX) 0.25 MG tablet, Take 0.25 mg by mouth at bedtime as needed for anxiety. , Disp: , Rfl:    amLODipine (NORVASC) 5 MG tablet, Take 0.5 tablets (2.5 mg total) by mouth daily., Disp: 45 tablet, Rfl: 2   amoxicillin-clavulanate (AUGMENTIN) 875-125 MG tablet, Take 1 tablet by mouth 2 (two) times daily., Disp: 60 tablet, Rfl: 11   aspirin EC 81 MG tablet, Take 81 mg by mouth daily., Disp: , Rfl:    atorvastatin (LIPITOR) 80 MG tablet, Take 1 tablet (80 mg total) by mouth daily., Disp: 90 tablet, Rfl: 2   Celecoxib (CELEBREX PO), Take 1 tablet by mouth 2 (two) times daily., Disp: , Rfl:    estradiol (ESTRACE) 1 MG tablet, Take 1.5 mg by mouth daily., Disp: , Rfl:    fluorouracil (EFUDEX) 5 % cream, Apply 1 application  topically as  needed (pre screening cancer)., Disp: , Rfl:    furosemide (LASIX) 20 MG tablet, TAKE 1 TABLET (20 MG TOTAL) BY MOUTH AS NEEDED FOR FLUID., Disp: 90 tablet, Rfl: 0   isosorbide mononitrate (IMDUR) 30 MG 24 hr tablet, Take 1 tablet (30 mg total) by mouth daily., Disp: 90 tablet, Rfl: 3   losartan (COZAAR) 25 MG tablet, TAKE 1 TABLET (25 MG TOTAL) BY MOUTH TWO (2) TIMES DAILY. (Patient  taking differently: Take 25 mg by mouth daily.), Disp: 180 tablet, Rfl: 3   morphine (MS CONTIN) 30 MG 12 hr tablet, Take 15 mg by mouth 2 (two) times daily., Disp: , Rfl:    mupirocin ointment (BACTROBAN) 2 %, Apply 1 Application topically daily., Disp: , Rfl:    nitroGLYCERIN (NITROSTAT) 0.4 MG SL tablet, Place 1 tablet (0.4 mg total) under the tongue every 5 (five) minutes as needed for chest pain., Disp: 25 tablet, Rfl: 10   polyethylene glycol (MIRALAX / GLYCOLAX) packet, Take 8.6 g by mouth as needed for mild constipation., Disp: , Rfl:    ranolazine (RANEXA) 500 MG 12 hr tablet, Take 1 tablet (500 mg total) by mouth 2 (two) times daily., Disp: 180 tablet, Rfl: 2   traZODone (DESYREL) 150 MG tablet, Take 150 mg by mouth at bedtime. , Disp: , Rfl:     Review of Systems  Constitutional:  Negative for activity change, appetite change, chills, diaphoresis, fatigue, fever and unexpected weight change.  HENT:  Negative for congestion, rhinorrhea, sinus pressure, sneezing, sore throat and trouble swallowing.   Eyes:  Negative for photophobia and visual disturbance.  Respiratory:  Negative for cough, chest tightness, shortness of breath, wheezing and stridor.   Cardiovascular:  Negative for chest pain, palpitations and leg swelling.  Gastrointestinal:  Negative for abdominal distention, abdominal pain, anal bleeding, blood in stool, constipation, diarrhea, nausea and vomiting.  Genitourinary:  Negative for difficulty urinating, dysuria, flank pain and hematuria.  Musculoskeletal:  Negative for arthralgias, back pain,  gait problem, joint swelling and myalgias.  Skin:  Negative for color change, pallor, rash and wound.  Neurological:  Negative for dizziness, tremors, weakness and light-headedness.  Hematological:  Negative for adenopathy. Does not bruise/bleed easily.  Psychiatric/Behavioral:  Negative for agitation, behavioral problems, confusion, decreased concentration, dysphoric mood and sleep disturbance.        Objective:   Physical Exam Constitutional:      General: She is not in acute distress.    Appearance: Normal appearance. She is well-developed. She is not ill-appearing or diaphoretic.  HENT:     Head: Normocephalic and atraumatic.     Right Ear: Hearing and external ear normal.     Left Ear: Hearing and external ear normal.     Nose: No nasal deformity or rhinorrhea.  Eyes:     General: No scleral icterus.    Conjunctiva/sclera: Conjunctivae normal.     Right eye: Right conjunctiva is not injected.     Left eye: Left conjunctiva is not injected.     Pupils: Pupils are equal, round, and reactive to light.  Neck:     Vascular: No JVD.     Comments: Sensation of lump on left side Cardiovascular:     Rate and Rhythm: Normal rate and regular rhythm.     Heart sounds: Normal heart sounds, S1 normal and S2 normal. No murmur heard.    No friction rub.  Abdominal:     General: Bowel sounds are normal. There is no distension.     Palpations: Abdomen is soft.     Tenderness: There is no abdominal tenderness.  Musculoskeletal:        General: Normal range of motion.     Right shoulder: Normal.     Left shoulder: Normal.     Cervical back: Normal range of motion and neck supple.     Right hip: Normal.     Left hip: Normal.     Right knee:  Normal.     Left knee: Normal.  Lymphadenopathy:     Head:     Right side of head: No submandibular, preauricular or posterior auricular adenopathy.     Left side of head: No submandibular, preauricular or posterior auricular adenopathy.      Cervical: No cervical adenopathy.     Right cervical: No superficial or deep cervical adenopathy.    Left cervical: No superficial or deep cervical adenopathy.  Skin:    General: Skin is warm and dry.     Coloration: Skin is not pale.     Findings: No abrasion, bruising, ecchymosis, erythema, lesion or rash.     Nails: There is no clubbing.  Neurological:     Mental Status: She is alert and oriented to person, place, and time.     Sensory: No sensory deficit.     Coordination: Coordination normal.     Gait: Gait normal.  Psychiatric:        Attention and Perception: She is attentive.        Speech: Speech normal.        Behavior: Behavior normal. Behavior is cooperative.        Thought Content: Thought content normal.        Judgment: Judgment normal.           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  Will continue Augmentin trying to push through into March.  Vaccine counseling recommended updated COVID and flu vaccinations

## 2023-02-16 LAB — CBC WITH DIFFERENTIAL/PLATELET
Absolute Lymphocytes: 2643 {cells}/uL (ref 850–3900)
Absolute Monocytes: 336 {cells}/uL (ref 200–950)
Basophils Absolute: 59 {cells}/uL (ref 0–200)
Basophils Relative: 1 %
Eosinophils Absolute: 177 {cells}/uL (ref 15–500)
Eosinophils Relative: 3 %
HCT: 40.9 % (ref 35.0–45.0)
Hemoglobin: 14.1 g/dL (ref 11.7–15.5)
MCH: 35.3 pg — ABNORMAL HIGH (ref 27.0–33.0)
MCHC: 34.5 g/dL (ref 32.0–36.0)
MCV: 102.3 fL — ABNORMAL HIGH (ref 80.0–100.0)
MPV: 10.3 fL (ref 7.5–12.5)
Monocytes Relative: 5.7 %
Neutro Abs: 2685 {cells}/uL (ref 1500–7800)
Neutrophils Relative %: 45.5 %
Platelets: 281 10*3/uL (ref 140–400)
RBC: 4 10*6/uL (ref 3.80–5.10)
RDW: 12 % (ref 11.0–15.0)
Total Lymphocyte: 44.8 %
WBC: 5.9 10*3/uL (ref 3.8–10.8)

## 2023-02-16 LAB — C-REACTIVE PROTEIN: CRP: <3 mg/L (ref <8.0)

## 2023-02-16 LAB — BASIC METABOLIC PANEL WITH GFR
BUN: 15 mg/dL (ref 7–25)
CO2: 30 mmol/L (ref 20–32)
Calcium: 9.9 mg/dL (ref 8.6–10.4)
Chloride: 103 mmol/L (ref 98–110)
Creat: 0.74 mg/dL (ref 0.50–1.05)
Glucose, Bld: 110 mg/dL — ABNORMAL HIGH (ref 65–99)
Potassium: 4.8 mmol/L (ref 3.5–5.3)
Sodium: 139 mmol/L (ref 135–146)
eGFR: 89 mL/min/{1.73_m2} (ref >=60)

## 2023-02-16 LAB — SEDIMENTATION RATE: Sed Rate: 2 mm/h (ref 0–30)

## 2023-04-17 DIAGNOSIS — G894 Chronic pain syndrome: Secondary | ICD-10-CM | POA: Diagnosis not present

## 2023-04-17 DIAGNOSIS — M47812 Spondylosis without myelopathy or radiculopathy, cervical region: Secondary | ICD-10-CM | POA: Diagnosis not present

## 2023-04-17 DIAGNOSIS — Z79891 Long term (current) use of opiate analgesic: Secondary | ICD-10-CM | POA: Diagnosis not present

## 2023-04-17 DIAGNOSIS — M25551 Pain in right hip: Secondary | ICD-10-CM | POA: Diagnosis not present

## 2023-04-17 DIAGNOSIS — M47816 Spondylosis without myelopathy or radiculopathy, lumbar region: Secondary | ICD-10-CM | POA: Diagnosis not present

## 2023-04-22 ENCOUNTER — Other Ambulatory Visit: Payer: Self-pay | Admitting: Cardiology

## 2023-04-25 NOTE — Telephone Encounter (Signed)
Per patient request to sent the Amlodipine 2.5 mg rather than 5mg  to cut in half. Medication sent

## 2023-05-17 ENCOUNTER — Ambulatory Visit: Payer: Medicare HMO | Admitting: Infectious Disease

## 2023-05-17 DIAGNOSIS — M272 Inflammatory conditions of jaws: Secondary | ICD-10-CM

## 2023-06-07 DIAGNOSIS — M25551 Pain in right hip: Secondary | ICD-10-CM | POA: Diagnosis not present

## 2023-06-07 DIAGNOSIS — M47816 Spondylosis without myelopathy or radiculopathy, lumbar region: Secondary | ICD-10-CM | POA: Diagnosis not present

## 2023-06-07 DIAGNOSIS — G894 Chronic pain syndrome: Secondary | ICD-10-CM | POA: Diagnosis not present

## 2023-06-07 DIAGNOSIS — M47812 Spondylosis without myelopathy or radiculopathy, cervical region: Secondary | ICD-10-CM | POA: Diagnosis not present

## 2023-07-10 DIAGNOSIS — R32 Unspecified urinary incontinence: Secondary | ICD-10-CM | POA: Diagnosis not present

## 2023-07-10 DIAGNOSIS — G4700 Insomnia, unspecified: Secondary | ICD-10-CM | POA: Diagnosis not present

## 2023-07-10 DIAGNOSIS — R5383 Other fatigue: Secondary | ICD-10-CM | POA: Diagnosis not present

## 2023-07-10 DIAGNOSIS — I25119 Atherosclerotic heart disease of native coronary artery with unspecified angina pectoris: Secondary | ICD-10-CM | POA: Diagnosis not present

## 2023-07-10 DIAGNOSIS — E785 Hyperlipidemia, unspecified: Secondary | ICD-10-CM | POA: Diagnosis not present

## 2023-07-10 DIAGNOSIS — E559 Vitamin D deficiency, unspecified: Secondary | ICD-10-CM | POA: Diagnosis not present

## 2023-07-10 DIAGNOSIS — Z79899 Other long term (current) drug therapy: Secondary | ICD-10-CM | POA: Diagnosis not present

## 2023-07-10 DIAGNOSIS — Z1331 Encounter for screening for depression: Secondary | ICD-10-CM | POA: Diagnosis not present

## 2023-07-10 DIAGNOSIS — R7309 Other abnormal glucose: Secondary | ICD-10-CM | POA: Diagnosis not present

## 2023-07-10 DIAGNOSIS — M869 Osteomyelitis, unspecified: Secondary | ICD-10-CM | POA: Diagnosis not present

## 2023-07-10 DIAGNOSIS — Z6821 Body mass index (BMI) 21.0-21.9, adult: Secondary | ICD-10-CM | POA: Diagnosis not present

## 2023-07-10 DIAGNOSIS — F419 Anxiety disorder, unspecified: Secondary | ICD-10-CM | POA: Diagnosis not present

## 2023-07-10 DIAGNOSIS — Z0000 Encounter for general adult medical examination without abnormal findings: Secondary | ICD-10-CM | POA: Diagnosis not present

## 2023-07-11 ENCOUNTER — Other Ambulatory Visit: Payer: Self-pay | Admitting: Cardiology

## 2023-07-24 DIAGNOSIS — R32 Unspecified urinary incontinence: Secondary | ICD-10-CM | POA: Diagnosis not present

## 2023-07-24 DIAGNOSIS — I700 Atherosclerosis of aorta: Secondary | ICD-10-CM | POA: Diagnosis not present

## 2023-07-24 DIAGNOSIS — Z833 Family history of diabetes mellitus: Secondary | ICD-10-CM | POA: Diagnosis not present

## 2023-07-24 DIAGNOSIS — I252 Old myocardial infarction: Secondary | ICD-10-CM | POA: Diagnosis not present

## 2023-07-24 DIAGNOSIS — G4700 Insomnia, unspecified: Secondary | ICD-10-CM | POA: Diagnosis not present

## 2023-07-24 DIAGNOSIS — E785 Hyperlipidemia, unspecified: Secondary | ICD-10-CM | POA: Diagnosis not present

## 2023-07-24 DIAGNOSIS — M1990 Unspecified osteoarthritis, unspecified site: Secondary | ICD-10-CM | POA: Diagnosis not present

## 2023-07-24 DIAGNOSIS — F411 Generalized anxiety disorder: Secondary | ICD-10-CM | POA: Diagnosis not present

## 2023-07-24 DIAGNOSIS — I25119 Atherosclerotic heart disease of native coronary artery with unspecified angina pectoris: Secondary | ICD-10-CM | POA: Diagnosis not present

## 2023-07-24 DIAGNOSIS — Z008 Encounter for other general examination: Secondary | ICD-10-CM | POA: Diagnosis not present

## 2023-07-24 DIAGNOSIS — Z8249 Family history of ischemic heart disease and other diseases of the circulatory system: Secondary | ICD-10-CM | POA: Diagnosis not present

## 2023-07-24 DIAGNOSIS — K5900 Constipation, unspecified: Secondary | ICD-10-CM | POA: Diagnosis not present

## 2023-07-24 DIAGNOSIS — M810 Age-related osteoporosis without current pathological fracture: Secondary | ICD-10-CM | POA: Diagnosis not present

## 2023-08-02 DIAGNOSIS — M25551 Pain in right hip: Secondary | ICD-10-CM | POA: Diagnosis not present

## 2023-08-02 DIAGNOSIS — M47812 Spondylosis without myelopathy or radiculopathy, cervical region: Secondary | ICD-10-CM | POA: Diagnosis not present

## 2023-08-02 DIAGNOSIS — M47816 Spondylosis without myelopathy or radiculopathy, lumbar region: Secondary | ICD-10-CM | POA: Diagnosis not present

## 2023-08-02 DIAGNOSIS — G894 Chronic pain syndrome: Secondary | ICD-10-CM | POA: Diagnosis not present

## 2023-08-29 ENCOUNTER — Ambulatory Visit: Attending: Cardiology | Admitting: Cardiology

## 2023-08-29 VITALS — BP 104/60 | HR 71 | Ht 64.0 in | Wt 138.0 lb

## 2023-08-29 DIAGNOSIS — R0609 Other forms of dyspnea: Secondary | ICD-10-CM

## 2023-08-29 DIAGNOSIS — I2510 Atherosclerotic heart disease of native coronary artery without angina pectoris: Secondary | ICD-10-CM

## 2023-08-29 DIAGNOSIS — R079 Chest pain, unspecified: Secondary | ICD-10-CM

## 2023-08-29 DIAGNOSIS — I5181 Takotsubo syndrome: Secondary | ICD-10-CM

## 2023-08-29 MED ORDER — ISOSORBIDE MONONITRATE ER 60 MG PO TB24: 60.0000 mg | ORAL_TABLET | Freq: Every day | ORAL | 3 refills | Status: AC

## 2023-08-29 NOTE — Progress Notes (Signed)
 Cardiology Office Note:    Date:  08/29/2023   ID:  Kathryn [name], DOB 29-Feb-1956, MRN [id number]  PCP:  Kathryn Bering, MD  Cardiologist:  Kathryn Burger, MD    Referring MD: Kathryn Bering, MD   Chief Complaint  Patient presents with   Chest Pain    History of Present Illness:    Kathryn [name] is a 68 y.o. female past medical history significant for coronary artery disease she did have coronary CT angio in January which showed mid LAD lesion with borderline significance she described to have some chest pain, atypical usually happen at night at rest.  At the same time she walks on the regular basis does not have pain during that time.  Interestingly when she gets pain she take nitroglycerin  it helps.  Also recently she was diagnosed with ADHD and some medication has been contemplated.  Past Medical History:  Diagnosis Date   Anxiety    Arthritis    Atypical chest pain 08/23/2018   Chronic pain 11/02/2017   Cigarette smoker 11/27/2017   Coronary artery disease 50% mid LAD and mid circumflex on cardiac catheterization 2018 11/15/2018   COVID-19 virus infection 08/30/2018   Dyspnea on exertion 08/23/2018   Hardware complicating wound infection (HCC) 10/17/2022   Heart attack (HCC) 10/2017   Infection due to 2019 novel coronavirus 08/28/2018   Formatting of this note might be different from the original. Notification of POS result Isolation since day of testing of patient and family members Local Health Dept will call with specific instructions Conv Care their POC until the [facility] contacts them Obtaining testing for symptomatic Family Members Discuss self-isolation measures Discuss in-home separation and safety measures Discuss web-based C   Mass of right axilla 08/04/2015   Neck pain 10/11/2011   Odontogenic infection of jaw    Personal history of malignant neoplasm of nasal cavities, middle ear, and accessory sinuses 11/10/2011   Pneumonia    Skin cancer (melanoma)  (HCC)    Stress incontinence    Takotsubo cardiomyopathy    Tobacco use    UTI (urinary tract infection)     Past Surgical History:  Procedure Laterality Date   ABDOMINAL HYSTERECTOMY     cervical cancer   back skin flap     BACK SURGERY     lumbar and cervical   hernia surgery     X2    LEFT HEART CATH AND CORONARY ANGIOGRAPHY N/A 10/30/2017   Procedure: LEFT HEART CATH AND CORONARY ANGIOGRAPHY;  Surgeon: Kathryn Leigh, MD;  Location: [facility];  Service: Cardiovascular;  Laterality: N/A;   MANDIBLE SURGERY     lower jaw   MELANOMA EXCISION     L arm and L leg, 2014   muscle flap surgery     NECK SURGERY     NOSE SURGERY  2012   Lost her nose due to cancer   SKIN CANCER EXCISION     squamous cell cancer , 2012   WRIST SURGERY      Current Medications: Current Meds  Medication Sig   ALPRAZolam  (XANAX ) 0.25 MG tablet Take 0.25 mg by mouth at bedtime as needed for anxiety.    amLODipine  (NORVASC ) 2.5 MG tablet Take 1 tablet (2.5 mg total) by mouth daily.   amoxicillin -clavulanate (AUGMENTIN ) 875-125 MG tablet Take 1 tablet by mouth 2 (two) times daily.   aspirin  EC 81 MG tablet Take 81 mg by mouth daily.   atorvastatin  (LIPITOR ) 80 MG tablet  Take 1 tablet (80 mg total) by mouth daily.   Celecoxib (CELEBREX PO) Take 1 tablet by mouth 2 (two) times daily.   estradiol (ESTRACE) 1 MG tablet Take 1.5 mg by mouth daily.   fesoterodine (TOVIAZ) 8 MG TB24 tablet Take 8 mg by mouth daily.   fluorouracil (EFUDEX) 5 % cream Apply 1 application  topically as needed (pre screening cancer).   furosemide  (LASIX ) 20 MG tablet TAKE 1 TABLET (20 MG TOTAL) BY MOUTH AS NEEDED FOR FLUID.   isosorbide  mononitrate (IMDUR ) 60 MG 24 hr tablet Take 1 tablet (60 mg total) by mouth daily.   losartan  (COZAAR ) 25 MG tablet TAKE 1 TABLET (25 MG TOTAL) BY MOUTH TWO (2) TIMES DAILY.   morphine  (MS CONTIN ) 30 MG 12 hr tablet Take 15 mg by mouth 2 (two) times daily.   mupirocin ointment  (BACTROBAN) 2 % Apply 1 Application topically daily.   MYRBETRIQ 50 MG TB24 tablet Take 50 mg by mouth daily.   nitroGLYCERIN  (NITROSTAT ) 0.4 MG SL tablet Place 1 tablet (0.4 mg total) under the tongue every 5 (five) minutes as needed for chest pain.   polyethylene glycol (MIRALAX / GLYCOLAX) packet Take 8.6 g by mouth as needed for mild constipation.   ranolazine  (RANEXA ) 500 MG 12 hr tablet Take 1 tablet (500 mg total) by mouth 2 (two) times daily.   solifenacin (VESICARE) 10 MG tablet Take 10 mg by mouth daily.   traZODone (DESYREL) 150 MG tablet Take 150 mg by mouth at bedtime.    [DISCONTINUED] isosorbide  mononitrate (IMDUR ) 30 MG 24 hr tablet Take 1 tablet (30 mg total) by mouth daily.     Allergies:   Cephalosporins   Social History   Socioeconomic History   Marital status: Married    Spouse name: Not on file   Number of children: 2   Years of education: Not on file   Highest education level: Not on file  Occupational History   Not on file  Tobacco Use   Smoking status: Some Days    Current packs/day: 0.25    Average packs/day: 0.3 packs/day for 40.0 years (10.0 ttl pk-yrs)    Types: Cigarettes   Smokeless tobacco: Never  Vaping Use   Vaping status: Never Used  Substance and Sexual Activity   Alcohol use: No   Drug use: No   Sexual activity: Not on file  Other Topics Concern   Not on file  Social History Narrative   Not on file   Social Drivers of Health   Financial Resource Strain: Not on file  Food Insecurity: Not on file  Transportation Needs: Not on file  Physical Activity: Not on file  Stress: Not on file  Social Connections: Not on file     Family History: The patient's family history includes Breast cancer in her mother; COPD in her father; Colon polyps in her mother; Diabetes in her father; Heart attack in her father; Heart disease in her father; Hypertension in her father; Kidney disease in her father. There is no history of Colon cancer or Esophageal  cancer. ROS:   Please see the history of present illness.    All 14 point review of systems negative except as described per history of present illness  EKGs/Labs/Other Studies Reviewed:    EKG Interpretation Date/Time:  Tuesday August 29 2023 16:00:36 EDT Ventricular Rate:  71 PR Interval:  186 QRS Duration:  78 QT Interval:  396 QTC Calculation: 430 R Axis:   74  Text Interpretation: Normal sinus rhythm Low voltage QRS Borderline ECG When compared with ECG of 08-Dec-2022 14:16, No significant change was found Confirmed by Kathryn [name] [phone]) on 08/29/2023 4:16:00 PM    Recent Labs: 10/17/2022: ALT 15 02/15/2023: BUN 15; Creat 0.74; Hemoglobin 14.1; Platelets 281; Potassium 4.8; Sodium 139  Recent Lipid Panel    Component Value Date/Time   CHOL 138 10/30/2017 0227   TRIG 25 10/30/2017 0227   HDL 69 10/30/2017 0227   CHOLHDL 2.0 10/30/2017 0227   VLDL 5 10/30/2017 0227   LDLCALC 64 10/30/2017 0227    Physical Exam:    VS:  BP 104/60 (BP Location: Right Arm, Patient Position: Sitting)   Pulse 71   Ht 5' 4 (1.626 m)   Wt 138 lb (62.6 kg)   LMP  (LMP Unknown)   SpO2 98%   BMI 23.69 kg/m     Wt Readings from Last 3 Encounters:  08/29/23 138 lb (62.6 kg)  02/15/23 129 lb (58.5 kg)  12/08/22 129 lb 6.4 oz (58.7 kg)     GEN:  Well nourished, well developed in no acute distress HEENT: Normal NECK: No JVD; No carotid bruits LYMPHATICS: No lymphadenopathy CARDIAC: RRR, no murmurs, no rubs, no gallops RESPIRATORY:  Clear to auscultation without rales, wheezing or rhonchi  ABDOMEN: Soft, non-tender, non-distended MUSCULOSKELETAL:  No edema; No deformity  SKIN: Warm and dry LOWER EXTREMITIES: no swelling NEUROLOGIC:  Alert and oriented x 3 PSYCHIATRIC:  Normal affect   ASSESSMENT:    1. Chest pain, unspecified type   2. Coronary artery disease involving native coronary artery of native heart without angina pectoris   3. Takotsubo cardiomyopathy   4. Dyspnea on  exertion    PLAN:    In order of problems listed above:  Chest pain atypical characteristic but seems nitroglycerin  helping will increase dose of Imdur  from 30 to 60 mg daily we will continue rest of the medication. Dyslipidemia I did review K PN which show me LDL 35 HDL 87 with this data from a year ago.  Will call primary care physician get more up-to-date information. History of Takotsubo with complete recovery. In terms of usage of ADHD medication should be fine to try   Medication Adjustments/Labs and Tests Ordered: Current medicines are reviewed at length with the patient today.  Concerns regarding medicines are outlined above.  Orders Placed This Encounter  Procedures   EKG 12-Lead   Medication changes:  Meds ordered this encounter  Medications   isosorbide  mononitrate (IMDUR ) 60 MG 24 hr tablet    Sig: Take 1 tablet (60 mg total) by mouth daily.    Dispense:  90 tablet    Refill:  3    Signed, Manfred Seed, MD, [facility] 08/29/2023 5:00 PM    [facility]

## 2023-08-29 NOTE — Patient Instructions (Addendum)
 Medication Instructions:   INCREASE: Imdur to 60mg  daily   Lab Work: None Ordered If you have labs (blood work) drawn today and your tests are completely normal, you will receive your results only by: MyChart Message (if you have MyChart) OR A paper copy in the mail If you have any lab test that is abnormal or we need to change your treatment, we will call you to review the results.   Testing/Procedures: None Ordered   Follow-Up: At [facility], you and your health needs are our priority.  As part of our continuing mission to provide you with exceptional heart care, we have created designated Provider Care Teams.  These Care Teams include your primary Cardiologist (physician) and Advanced Practice Providers (APPs -  Physician Assistants and Nurse Practitioners) who all work together to provide you with the care you need, when you need it.  We recommend signing up for the patient portal called "MyChart".  Sign up information is provided on this After Visit Summary.  MyChart is used to connect with patients for Virtual Visits (Telemedicine).  Patients are able to view lab/test results, encounter notes, upcoming appointments, etc.  Non-urgent messages can be sent to your provider as well.   To learn more about what you can do with MyChart, go to [email or web address].    Your next appointment:   6 month(s)  The format for your next appointment:   In Person  Provider:   Gypsy Balsam, MD    Other Instructions NA

## 2023-09-27 DIAGNOSIS — M47812 Spondylosis without myelopathy or radiculopathy, cervical region: Secondary | ICD-10-CM | POA: Diagnosis not present

## 2023-09-27 DIAGNOSIS — M47816 Spondylosis without myelopathy or radiculopathy, lumbar region: Secondary | ICD-10-CM | POA: Diagnosis not present

## 2023-09-27 DIAGNOSIS — M25551 Pain in right hip: Secondary | ICD-10-CM | POA: Diagnosis not present

## 2023-09-27 DIAGNOSIS — G894 Chronic pain syndrome: Secondary | ICD-10-CM | POA: Diagnosis not present

## 2023-09-28 DIAGNOSIS — Z79891 Long term (current) use of opiate analgesic: Secondary | ICD-10-CM | POA: Diagnosis not present

## 2023-09-28 DIAGNOSIS — G894 Chronic pain syndrome: Secondary | ICD-10-CM | POA: Diagnosis not present

## 2023-11-14 ENCOUNTER — Ambulatory Visit: Admitting: Cardiology

## 2023-11-21 DIAGNOSIS — H524 Presbyopia: Secondary | ICD-10-CM | POA: Diagnosis not present

## 2023-11-21 DIAGNOSIS — H04123 Dry eye syndrome of bilateral lacrimal glands: Secondary | ICD-10-CM | POA: Diagnosis not present

## 2023-11-21 DIAGNOSIS — H35373 Puckering of macula, bilateral: Secondary | ICD-10-CM | POA: Diagnosis not present

## 2023-11-30 DIAGNOSIS — N3941 Urge incontinence: Secondary | ICD-10-CM | POA: Diagnosis not present

## 2023-11-30 DIAGNOSIS — R351 Nocturia: Secondary | ICD-10-CM | POA: Diagnosis not present

## 2023-11-30 DIAGNOSIS — R350 Frequency of micturition: Secondary | ICD-10-CM | POA: Diagnosis not present

## 2023-12-05 DIAGNOSIS — G894 Chronic pain syndrome: Secondary | ICD-10-CM | POA: Diagnosis not present

## 2023-12-05 DIAGNOSIS — M47816 Spondylosis without myelopathy or radiculopathy, lumbar region: Secondary | ICD-10-CM | POA: Diagnosis not present

## 2023-12-05 DIAGNOSIS — M47812 Spondylosis without myelopathy or radiculopathy, cervical region: Secondary | ICD-10-CM | POA: Diagnosis not present

## 2023-12-05 DIAGNOSIS — M25551 Pain in right hip: Secondary | ICD-10-CM | POA: Diagnosis not present

## 2024-01-02 DIAGNOSIS — N3281 Overactive bladder: Secondary | ICD-10-CM | POA: Diagnosis not present

## 2024-01-02 DIAGNOSIS — R351 Nocturia: Secondary | ICD-10-CM | POA: Diagnosis not present

## 2024-01-02 DIAGNOSIS — R350 Frequency of micturition: Secondary | ICD-10-CM | POA: Diagnosis not present

## 2024-01-02 DIAGNOSIS — R399 Unspecified symptoms and signs involving the genitourinary system: Secondary | ICD-10-CM | POA: Diagnosis not present

## 2024-01-05 ENCOUNTER — Encounter: Payer: Self-pay | Admitting: Gastroenterology

## 2024-01-08 ENCOUNTER — Other Ambulatory Visit: Payer: Self-pay

## 2024-01-08 DIAGNOSIS — I2510 Atherosclerotic heart disease of native coronary artery without angina pectoris: Secondary | ICD-10-CM | POA: Diagnosis not present

## 2024-01-08 DIAGNOSIS — K5900 Constipation, unspecified: Secondary | ICD-10-CM | POA: Diagnosis not present

## 2024-01-08 DIAGNOSIS — R7309 Other abnormal glucose: Secondary | ICD-10-CM | POA: Diagnosis not present

## 2024-01-08 DIAGNOSIS — Z6823 Body mass index (BMI) 23.0-23.9, adult: Secondary | ICD-10-CM | POA: Diagnosis not present

## 2024-01-08 DIAGNOSIS — R1084 Generalized abdominal pain: Secondary | ICD-10-CM | POA: Diagnosis not present

## 2024-01-08 DIAGNOSIS — N3946 Mixed incontinence: Secondary | ICD-10-CM | POA: Diagnosis not present

## 2024-01-08 DIAGNOSIS — E785 Hyperlipidemia, unspecified: Secondary | ICD-10-CM | POA: Diagnosis not present

## 2024-01-08 DIAGNOSIS — G4700 Insomnia, unspecified: Secondary | ICD-10-CM | POA: Diagnosis not present

## 2024-01-08 DIAGNOSIS — R41840 Attention and concentration deficit: Secondary | ICD-10-CM | POA: Diagnosis not present

## 2024-01-08 DIAGNOSIS — F419 Anxiety disorder, unspecified: Secondary | ICD-10-CM | POA: Diagnosis not present

## 2024-01-08 MED ORDER — AMLODIPINE BESYLATE 2.5 MG PO TABS: 2.5000 mg | ORAL_TABLET | Freq: Every day | ORAL | 1 refills | Status: AC

## 2024-01-08 MED ORDER — ATORVASTATIN CALCIUM 80 MG PO TABS: 80.0000 mg | ORAL_TABLET | Freq: Every day | ORAL | 1 refills | Status: AC

## 2024-01-11 ENCOUNTER — Other Ambulatory Visit: Payer: Self-pay | Admitting: Cardiology

## 2024-01-30 DIAGNOSIS — Z1231 Encounter for screening mammogram for malignant neoplasm of breast: Secondary | ICD-10-CM | POA: Diagnosis not present

## 2024-01-30 DIAGNOSIS — R928 Other abnormal and inconclusive findings on diagnostic imaging of breast: Secondary | ICD-10-CM | POA: Diagnosis not present

## 2024-01-30 DIAGNOSIS — R92333 Mammographic heterogeneous density, bilateral breasts: Secondary | ICD-10-CM | POA: Diagnosis not present

## 2024-01-31 DIAGNOSIS — M47812 Spondylosis without myelopathy or radiculopathy, cervical region: Secondary | ICD-10-CM | POA: Diagnosis not present

## 2024-01-31 DIAGNOSIS — M47816 Spondylosis without myelopathy or radiculopathy, lumbar region: Secondary | ICD-10-CM | POA: Diagnosis not present

## 2024-01-31 DIAGNOSIS — G894 Chronic pain syndrome: Secondary | ICD-10-CM | POA: Diagnosis not present

## 2024-01-31 DIAGNOSIS — M25551 Pain in right hip: Secondary | ICD-10-CM | POA: Diagnosis not present

## 2024-02-06 DIAGNOSIS — R351 Nocturia: Secondary | ICD-10-CM | POA: Diagnosis not present

## 2024-02-06 DIAGNOSIS — N3281 Overactive bladder: Secondary | ICD-10-CM | POA: Diagnosis not present

## 2024-02-06 DIAGNOSIS — R350 Frequency of micturition: Secondary | ICD-10-CM | POA: Diagnosis not present

## 2024-02-23 ENCOUNTER — Ambulatory Visit: Payer: Self-pay | Admitting: Gastroenterology

## 2024-02-23 ENCOUNTER — Encounter: Payer: Self-pay | Admitting: Gastroenterology

## 2024-02-23 ENCOUNTER — Ambulatory Visit: Admitting: Gastroenterology

## 2024-02-23 ENCOUNTER — Other Ambulatory Visit

## 2024-02-23 ENCOUNTER — Ambulatory Visit (INDEPENDENT_AMBULATORY_CARE_PROVIDER_SITE_OTHER)
Admission: RE | Admit: 2024-02-23 | Discharge: 2024-02-23 | Disposition: A | Source: Ambulatory Visit | Attending: Gastroenterology

## 2024-02-23 VITALS — BP 138/70 | HR 74 | Ht 62.5 in | Wt 143.0 lb

## 2024-02-23 DIAGNOSIS — E538 Deficiency of other specified B group vitamins: Secondary | ICD-10-CM | POA: Diagnosis not present

## 2024-02-23 DIAGNOSIS — Z1211 Encounter for screening for malignant neoplasm of colon: Secondary | ICD-10-CM

## 2024-02-23 DIAGNOSIS — R194 Change in bowel habit: Secondary | ICD-10-CM | POA: Diagnosis not present

## 2024-02-23 DIAGNOSIS — R1310 Dysphagia, unspecified: Secondary | ICD-10-CM

## 2024-02-23 DIAGNOSIS — K219 Gastro-esophageal reflux disease without esophagitis: Secondary | ICD-10-CM

## 2024-02-23 DIAGNOSIS — G894 Chronic pain syndrome: Secondary | ICD-10-CM | POA: Diagnosis not present

## 2024-02-23 DIAGNOSIS — Z85828 Personal history of other malignant neoplasm of skin: Secondary | ICD-10-CM

## 2024-02-23 DIAGNOSIS — R151 Fecal smearing: Secondary | ICD-10-CM

## 2024-02-23 DIAGNOSIS — Z0389 Encounter for observation for other suspected diseases and conditions ruled out: Secondary | ICD-10-CM | POA: Diagnosis not present

## 2024-02-23 DIAGNOSIS — R5383 Other fatigue: Secondary | ICD-10-CM | POA: Diagnosis not present

## 2024-02-23 DIAGNOSIS — R109 Unspecified abdominal pain: Secondary | ICD-10-CM

## 2024-02-23 LAB — B12 AND FOLATE PANEL
Folate: 22.4 ng/mL (ref >5.9)
Vitamin B-12: 281 pg/mL (ref 211–911)

## 2024-02-23 MED ORDER — NA SULFATE-K SULFATE-MG SULF 17.5-3.13-1.6 GM/177ML PO SOLN: 1.0000 | Freq: Once | ORAL | 0 refills | Status: AC

## 2024-02-23 NOTE — Progress Notes (Signed)
 Chief Complaint:colonoscopy, constipation, fatigue Primary GI Doctor: Dr. Charlanne  HPI:  Patient is a  68  year old female patient with past medical history of CAD, chronic pain, anxiety,GERD with eso dysphagia, who was self referred to me for a evaluation of colonoscopy, constipation, fatigue .    08/29/23 seen by cardiology for chest pain. Imdur  increased. Reviewed entire note.  Interval History Patient presents for evaluation of several gastrointestinal complaints and to discuss EGD/colon.  Patient reports having chronic constipation since giving birth to her first child.  Patient is currently using over-the-counter MiraLAX as needed.  Patient reports that she uses it every other day or daily it causes too much diarrhea.  Patient reports over the last 2 weeks she has not been taking any laxatives due to having pasty loose stool.  Patient reports she will wake up first thing in the morning to go to the restroom and pass stool without even knowing.  Patient has had intermittent abdominal discomfort in the lower region with severe bloating after eating.  No blood in stool.  Reports she just had abdominal xray in [place] approx 2 mths ago? and showed mild constipation.   Patient taking Morphine  MS Contin  TID for chronic pain.  Patient also complains of history of esophageal dysphagia.  Patient reports this has been going on for quite some time.  Patient reports she will have pills that get lodged in the back of her throat as well as choking on foods such as meat or solid.  Patient denies having any GERD symptoms.  Patient has taken PPI therapy in the past and denies this improving the dysphagia.  Patient has had swallow study in the past and worked with speech which she states also did not improve her current symptoms. At the time EGD was recommended however due to recent cardiac issues it was not completed.   Patient has a lot of fatigue.   Nonsmoker. No alcohol use.   Patient currently  taking baby Asa 81mg  po daily   Patients first colonoscopy was at age 49 with Dr. Towana and normal per patient.   Surgical history: multiple as listed below  Patient's family history includes: Breast cancer in her mother;  Colon polyps in her mother, father with esophageal strictures requiring dilatation  Patient having procedure on Jan 1/6 with urology for PNE, she has had issues with urinary incontinence.   Wt Readings from Last 3 Encounters:  02/23/24 143 lb (64.9 kg)  08/29/23 138 lb (62.6 kg)  02/15/23 129 lb (58.5 kg)    Past Medical History:  Diagnosis Date   Anxiety    Arthritis    Atypical chest pain 08/23/2018   Basal cell carcinoma    Cervical cancer (HCC)    Chronic pain 11/02/2017   Cigarette smoker 11/27/2017   Coronary artery disease 50% mid LAD and mid circumflex on cardiac catheterization 2018 11/15/2018   COVID-19 virus infection 08/30/2018   Dyspnea on exertion 08/23/2018   Hardware complicating wound infection 10/17/2022   Heart attack (HCC) 10/2017   Infection due to 2019 novel coronavirus 08/28/2018   Formatting of this note might be different from the original. Notification of POS result Isolation since day of testing of patient and family members Local Health Dept will call with specific instructions Conv Care their POC until the [facility] contacts them Obtaining testing for symptomatic Family Members Discuss self-isolation measures Discuss in-home separation and safety measures Discuss web-based C   Kidney stone    Mass of  right axilla 08/04/2015   Neck pain 10/11/2011   Odontogenic infection of jaw    Ovarian cyst    Personal history of malignant neoplasm of nasal cavities, middle ear, and accessory sinuses 11/10/2011   Pneumonia    Skin cancer (melanoma) (HCC)    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    Stress incontinence    Takotsubo cardiomyopathy    Tobacco use    UTI (urinary tract infection)     Past Surgical History:  Procedure Laterality  Date   ABDOMINAL HYSTERECTOMY     cervical cancer   CARPAL TUNNEL RELEASE Right    CERVICAL DISC SURGERY     x 3   INGUINAL HERNIA REPAIR Left    LEFT HEART CATH AND CORONARY ANGIOGRAPHY N/A 10/30/2017   Procedure: LEFT HEART CATH AND CORONARY ANGIOGRAPHY;  Surgeon: Court Dorn PARAS, MD;  Location: [facility];  Service: Cardiovascular;  Laterality: N/A;   LUMBAR FUSION     MANDIBLE SURGERY     lower jaw   MELANOMA EXCISION     L arm and L leg, 2014   muscle flap surgery     between shoulder blades   NOSE SURGERY  2012   Lost her nose due to cancer   SKIN CANCER EXCISION     squamous cell cancer , 2012   TRIGGER FINGER RELEASE Right    thumb   UMBILICAL HERNIA REPAIR     WRIST SURGERY     bone fragments    Current Outpatient Medications  Medication Sig Dispense Refill   ALPRAZolam  (XANAX ) 0.25 MG tablet Take 0.25 mg by mouth at bedtime as needed for anxiety.      amLODipine  (NORVASC ) 2.5 MG tablet Take 1 tablet (2.5 mg total) by mouth daily. 90 tablet 1   amoxicillin -clavulanate (AUGMENTIN ) 875-125 MG tablet Take 1 tablet by mouth 2 (two) times daily. 60 tablet 11   aspirin  EC 81 MG tablet Take 81 mg by mouth daily.     atorvastatin  (LIPITOR ) 80 MG tablet Take 1 tablet (80 mg total) by mouth daily. 90 tablet 1   estradiol (ESTRACE) 1 MG tablet Take 1.5 mg by mouth daily.     fesoterodine (TOVIAZ) 8 MG TB24 tablet Take 8 mg by mouth daily.     fluorouracil (EFUDEX) 5 % cream Apply 1 application  topically as needed (pre screening cancer).     furosemide  (LASIX ) 20 MG tablet TAKE 1 TABLET (20 MG TOTAL) BY MOUTH AS NEEDED FOR FLUID. 90 tablet 0   isosorbide  mononitrate (IMDUR ) 60 MG 24 hr tablet Take 1 tablet (60 mg total) by mouth daily. 90 tablet 3   losartan  (COZAAR ) 25 MG tablet TAKE 1 TABLET (25 MG TOTAL) BY MOUTH TWO (2) TIMES DAILY. 180 tablet 3   morphine  (MS CONTIN ) 30 MG 12 hr tablet Take 15 mg by mouth 2 (two) times daily.     mupirocin ointment (BACTROBAN) 2  % Apply 1 Application topically daily.     Na Sulfate-K Sulfate-Mg Sulfate concentrate (SUPREP) 17.5-3.13-1.6 GM/177ML SOLN Take 1 kit (354 mLs total) by mouth once for 1 dose. 354 mL 0   nitroGLYCERIN  (NITROSTAT ) 0.4 MG SL tablet Place 1 tablet (0.4 mg total) under the tongue every 5 (five) minutes as needed for chest pain. 25 tablet 10   polyethylene glycol (MIRALAX / GLYCOLAX) packet Take 8.6 g by mouth as needed for mild constipation.     ranolazine  (RANEXA ) 500 MG 12 hr tablet Take  1 tablet (500 mg total) by mouth 2 (two) times daily. 180 tablet 2   solifenacin (VESICARE) 10 MG tablet Take 10 mg by mouth daily.     traZODone (DESYREL) 150 MG tablet Take 150 mg by mouth at bedtime.      Celecoxib (CELEBREX PO) Take 1 tablet by mouth 2 (two) times daily.     MYRBETRIQ 50 MG TB24 tablet Take 50 mg by mouth daily.     No current facility-administered medications for this visit.    Allergies as of 02/23/2024 - Review Complete 02/23/2024  Allergen Reaction Noted   Cephalosporins Rash 05/21/2013    Family History  Problem Relation Age of Onset   Breast cancer Mother        said all her sisters too   Colon polyps Mother    Throat cancer Mother        [place]   Thyroid  disease Mother    Heart disease Father    Diabetes Father    Kidney disease Father        stage 3   COPD Father    Heart attack Father    Hypertension Father    Macular degeneration Father    Blindness Father    Pancreatic cancer Father    Multiple sclerosis Sister    Macular degeneration Maternal Grandmother    Blindness Maternal Grandmother    Heart attack Paternal Grandfather    Macular degeneration Paternal Aunt        all aunts   Colon cancer Neg Hx    Esophageal cancer Neg Hx     Review of Systems:    Constitutional: No weight loss, fever, chills, weakness or fatigue HEENT: Eyes: No change in vision               Ears, Nose, Throat:  No change in hearing or congestion Skin: No rash or  itching Cardiovascular: No chest pain, chest pressure or palpitations   Respiratory: No SOB or cough Gastrointestinal: See HPI and otherwise negative Genitourinary: No dysuria or change in urinary frequency Neurological: No headache, dizziness or syncope Musculoskeletal: No new muscle or joint pain Hematologic: No bleeding or bruising Psychiatric: No history of depression or anxiety    Physical Exam:  Vital signs: BP 138/70 (BP Location: Left Arm, Patient Position: Sitting, Cuff Size: Normal)   Pulse 74   Ht 5' 2.5 (1.588 m) Comment: height measured without shoes  Wt 143 lb (64.9 kg)   LMP  (LMP Unknown)   BMI 25.74 kg/m   Constitutional:   Pleasant  female appears to be in NAD, Well developed, Well nourished, alert and cooperative Eyes:   PEERL, EOMI. No icterus. Conjunctiva pink. Neck:  Supple Throat: Oral cavity and pharynx without inflammation, swelling or lesion.  Respiratory: Respirations even and unlabored. Lungs clear to auscultation bilaterally.   No wheezes, crackles, or rhonchi.  Cardiovascular: Normal S1, S2. Regular rate and rhythm. No peripheral edema, cyanosis or pallor.  Gastrointestinal:  Soft, nondistended, nontender. No rebound or guarding. Normal bowel sounds. No appreciable masses or hepatomegaly. Rectal:  Not performed.  Msk:  Symmetrical without gross deformities. Without edema, no deformity or joint abnormality.  Neurologic:  Alert and  oriented x4;  grossly normal neurologically.  Skin:   Dry and intact without significant lesions or rashes.  RELEVANT LABS AND IMAGING: CBC    Latest Ref Rng & Units 02/15/2023    3:40 PM 10/17/2022    2:36 PM 07/05/2022    2:26 AM  CBC  WBC 3.8 - 10.8 Thousand/uL 5.9  6.5  5.9   Hemoglobin 11.7 - 15.5 g/dL 85.8  87.5  86.8   Hematocrit 35.0 - 45.0 % 40.9  36.4  38.3   Platelets 140 - 400 Thousand/uL 281  251  300      CMP     Latest Ref Rng & Units 02/15/2023    3:40 PM 10/17/2022    2:36 PM 07/05/2022    2:26 AM   CMP  Glucose 65 - 99 mg/dL 889  882  897   BUN 7 - 25 mg/dL 15  16  17    Creatinine 0.50 - 1.05 mg/dL 9.25  9.13  9.13   Sodium 135 - 146 mmol/L 139  140  140   Potassium 3.5 - 5.3 mmol/L 4.8  4.4  4.9   Chloride 98 - 110 mmol/L 103  104  105   CO2 20 - 32 mmol/L 30  31  29    Calcium  8.6 - 10.4 mg/dL 9.9  9.5  9.6   Total Protein 6.1 - 8.1 g/dL  6.3    Total Bilirubin 0.2 - 1.2 mg/dL  0.5    AST 10 - 35 U/L  17    ALT 6 - 29 U/L  15       Lab Results  Component Value Date   TSH 1.240 06/11/2020   11/2022 echo- Left ventricular ejection fraction, by estimation, is 60 to 65%.   11/2018 MRI Abd MPRESSION: 1. No suspicious liver lesions identified at this time. Multiple nonenhancing T2 hyperintense structures involve both lobes of liver compatible with multiple cysts  12/2017 DG esophagus IMPRESSION: 1. Poor epiglottis turnover at least partially due to anterior osteophyte at C3-4, causing stasis of pill in the vallecula. This reproduced the patient's symptoms. 2. Suspect gastroesophageal reflux.  No esophageal stricture. 3. On the final images trace barium was seen at the level of the cords, attributed to nonvisualized laryngeal penetration.  01/16/2018 DG swallow with speech Pt presents with mild oropharyngeal dysphagia with decreased oral propulsion of solids/puree resulting in tongue base residuals that spill lower into pharynx.     In addition, decreased epiglottic deflection results in vallecular residuals post-swallow (worse with solids/pudding).  Chin tuck/head turn right/neck extension did not prevent accumulation at vallecular region.  Liquid swallows and dry swallows *though effortful for pt* were helpful to reduce residuals to minimal.     Trace laryngeal penetration due to decreased laryngeal elevation observed - but not to vocal cords and was only trace.  Barium tablet given with pudding which cleared well without significant residuals.     Using video, educated pt  to findings/recommendations using teach back.  Pt states she does not drink liquids during meals - advised her to start consuming liquids t/o meal to decrease residuals.  In addition, advised pt to strengthen a forced gag as adequate tongue base retraction will expel solids/pudding from vallecular space.  Uncertain to source of pt's dysphagia?  Of note, at one point, pt sensed residuals in pharynx when her pharynx appearead clear of barium - ? referrant?  Suspect multifactorial dysphagia present.  Provided pt with written precautions and using teach back, education reinforced.  Thanks for this consult.   Assessment/Plan: Encounter Diagnoses  Name Primary?   Special screening for malignant neoplasms, colon Yes   Altered bowel habits    Fecal smearing    Abdominal pain, unspecified abdominal location    Gastroesophageal reflux disease, unspecified whether esophagitis present  Dysphagia, unspecified type    B12 deficiency    History of skin cancer    Chronic pain syndrome    Other fatigue    68 year old female patient that presents with altered bowel habits, fecal smearing, abdominal pain, and eso dysphagia.  #1 Colon screening colonoscopy, last colonoscopy 18 years ago - Schedule for a colonoscopy in [facility] with Dr. Charlanne. The risks and benefits of colonoscopy with possible polypectomy / biopsies were discussed and the patient agrees to proceed.  -request records from Dr. Towana  #2 History of Constipation,  #3 on chronic opioids  #4 Altered BM's, currently loose and pasty - abdominal xray 2 view r/o obstipation -bowel purge day #1  - then restart Miralax po dialy, titrate as needed  #5 H/O GERD with #6 esophageal dysphagia , not on PPI. Denies GERD symptoms. MBSS with uncertain source of dysphagia (01/2028) per Speech report #7 Hx of basal cell and squamous carcinoma of nasal cavity, sinus, accessory muscle - s/p surgery- no radiation per pt -Schedule EGD in [facility] with poss dilatation  with Dr. Charlanne. The risks and benefits of EGD with possible biopsies and esophageal dilation were discussed with the patient who agrees to proceed.  #8 H/O NSTEMI 10/30/2017: d/t demand ischemia d/t stress induced cardiomyopathy (Takotsubo syndrome). EF 60-65% by Echo. Followed by Dr. Krasowski.  #9 Elevated MCV and MCH, chronic. Hgb WNL. Noovert bleeding #10 Fatigue -Check  b 12 / folate level  Thank you for the courtesy of this consult. Please call me with any questions or concerns.   Jonah Nestle, FNP-C [facility] 02/23/2024, 12:49 PM  Cc: Jefferey Fitch, MD

## 2024-02-23 NOTE — Patient Instructions (Addendum)
 Step #1 wait for results of abdominal xray Deanna, NP recommends that you complete a bowel purge (to clean out your bowels). Please do the following: Purchase a bottle of Miralax over the counter as well as a box of 5 mg dulcolax tablets. Take 4 dulcolax tablets. Wait 1 hour. You will then drink 6-8 capfuls of Miralax mixed in an adequate amount of water/juice/gatorade (you may choose which of these liquids to drink) over the next 2-3 hours. You should expect results within 1 to 6 hours after completing the bowel purge.  Your provider has requested that you go to the basement level for lab work before leaving today. Press B on the elevator. The lab is located at the first door on the left as you exit the elevator.  You have been scheduled for an endoscopy and colonoscopy. Please follow the written instructions given to you at your visit today.  If you use inhalers (even only as needed), please bring them with you on the day of your procedure.  DO NOT TAKE 7 DAYS PRIOR TO TEST- Trulicity (dulaglutide) Ozempic, Wegovy (semaglutide) Mounjaro, Zepbound (tirzepatide) Bydureon Bcise (exanatide extended release)  DO NOT TAKE 1 DAY PRIOR TO YOUR TEST Rybelsus (semaglutide) Adlyxin (lixisenatide) Victoza (liraglutide) Byetta (exanatide) ___________________________________________________________________________ Due to recent changes in healthcare laws, you may see the results of your imaging and laboratory studies on MyChart before your provider has had a chance to review them.  We understand that in some cases there may be results that are confusing or concerning to you. Not all laboratory results come back in the same time frame and the provider may be waiting for multiple results in order to interpret others.  Please give us  48 hours in order for your provider to thoroughly review all the results before contacting the office for clarification of your results.    _______________________________________________________  If your blood pressure at your visit was 140/90 or greater, please contact your primary care physician to follow up on this.  _______________________________________________________  If you are age 68 or older, your body mass index should be between 23-30. Your Body mass index is 25.74 kg/m. If this is out of the aforementioned range listed, please consider follow up with your Primary Care Provider.  If you are age 13 or younger, your body mass index should be between 19-25. Your Body mass index is 25.74 kg/m. If this is out of the aformentioned range listed, please consider follow up with your Primary Care Provider.   ________________________________________________________  The  GI providers would like to encourage you to use MYCHART to communicate with providers for non-urgent requests or questions.  Due to long hold times on the telephone, sending your provider a message by [facility] may be a faster and more efficient way to get a response.  Please allow 48 business hours for a response.  Please remember that this is for non-urgent requests.  _______________________________________________________  [facility] is using a team-based approach to care.  Your team is made up of your doctor and two to three APPS. Our APPS (Nurse Practitioners and Physician Assistants) work with your physician to ensure care continuity for you. They are fully qualified to address your health concerns and develop a treatment plan. They communicate directly with your gastroenterologist to care for you. Seeing the Advanced Practice Practitioners on your physician's team can help you by facilitating care more promptly, often allowing for earlier appointments, access to diagnostic testing, procedures, and other specialty referrals.   Thank you for  trusting me with your gastrointestinal care. Deanna May, FNP-C

## 2024-04-09 ENCOUNTER — Telehealth: Payer: Self-pay | Admitting: *Deleted

## 2024-04-09 MED ORDER — RANOLAZINE ER 500 MG PO TB12: 500.0000 mg | ORAL_TABLET | Freq: Two times a day (BID) | ORAL | 1 refills | Status: AC

## 2024-04-09 NOTE — Telephone Encounter (Signed)
 Rx refill sent to pharmacy.

## 2024-04-11 ENCOUNTER — Encounter: Payer: Self-pay | Admitting: Gastroenterology

## 2024-04-11 ENCOUNTER — Ambulatory Visit: Admitting: Gastroenterology

## 2024-04-11 VITALS — BP 125/67 | HR 61 | Temp 97.7°F | Resp 11 | Ht 62.0 in | Wt 143.0 lb

## 2024-04-11 DIAGNOSIS — D122 Benign neoplasm of ascending colon: Secondary | ICD-10-CM

## 2024-04-11 DIAGNOSIS — K635 Polyp of colon: Secondary | ICD-10-CM

## 2024-04-11 DIAGNOSIS — R1310 Dysphagia, unspecified: Secondary | ICD-10-CM

## 2024-04-11 DIAGNOSIS — K2281 Esophageal polyp: Secondary | ICD-10-CM | POA: Diagnosis not present

## 2024-04-11 DIAGNOSIS — Z1211 Encounter for screening for malignant neoplasm of colon: Secondary | ICD-10-CM | POA: Diagnosis present

## 2024-04-11 DIAGNOSIS — D125 Benign neoplasm of sigmoid colon: Secondary | ICD-10-CM

## 2024-04-11 DIAGNOSIS — E538 Deficiency of other specified B group vitamins: Secondary | ICD-10-CM

## 2024-04-11 DIAGNOSIS — K222 Esophageal obstruction: Secondary | ICD-10-CM | POA: Diagnosis not present

## 2024-04-11 DIAGNOSIS — R109 Unspecified abdominal pain: Secondary | ICD-10-CM

## 2024-04-11 DIAGNOSIS — K219 Gastro-esophageal reflux disease without esophagitis: Secondary | ICD-10-CM | POA: Diagnosis not present

## 2024-04-11 DIAGNOSIS — K3189 Other diseases of stomach and duodenum: Secondary | ICD-10-CM | POA: Diagnosis not present

## 2024-04-11 DIAGNOSIS — R194 Change in bowel habit: Secondary | ICD-10-CM

## 2024-04-11 DIAGNOSIS — K640 First degree hemorrhoids: Secondary | ICD-10-CM | POA: Diagnosis not present

## 2024-04-11 DIAGNOSIS — K2270 Barrett's esophagus without dysplasia: Secondary | ICD-10-CM | POA: Diagnosis not present

## 2024-04-11 DIAGNOSIS — K2970 Gastritis, unspecified, without bleeding: Secondary | ICD-10-CM | POA: Diagnosis not present

## 2024-04-11 DIAGNOSIS — Q399 Congenital malformation of esophagus, unspecified: Secondary | ICD-10-CM

## 2024-04-11 MED ORDER — OMEPRAZOLE 20 MG PO CPDR: 20.0000 mg | DELAYED_RELEASE_CAPSULE | Freq: Every day | ORAL | 0 refills | Status: AC

## 2024-04-11 MED ORDER — SODIUM CHLORIDE 0.9 % IV SOLN: 500.0000 mL | Freq: Once | INTRAVENOUS | Status: DC

## 2024-04-11 NOTE — Progress Notes (Signed)
 1315 Simethicone 133 mg per 2 cc given with 3 cc of H20 per Dr Charlanne request. Robinul 0.1 mg IV given due large amount of secretions upon assessment.  MD made aware, vss

## 2024-04-11 NOTE — Patient Instructions (Signed)
 YOU HAD AN ENDOSCOPIC PROCEDURE TODAY AT THE [facility]:   Refer to the procedure report that was given to you for any specific questions about what was found during the examination.  If the procedure report does not answer your questions, please call your gastroenterologist to clarify.  If you requested that your care partner not be given the details of your procedure findings, then the procedure report has been included in a sealed envelope for you to review at your convenience later.  YOU SHOULD EXPECT: Some feelings of bloating in the abdomen. Passage of more gas than usual.  Walking can help get rid of the air that was put into your GI tract during the procedure and reduce the bloating. If you had a lower endoscopy (such as a colonoscopy or flexible sigmoidoscopy) you may notice spotting of blood in your stool or on the toilet paper. If you underwent a bowel prep for your procedure, you may not have a normal bowel movement for a few days.  Please Note:  You might notice some irritation and congestion in your nose or some drainage.  This is from the oxygen used during your procedure.  There is no need for concern and it should clear up in a day or so.  SYMPTOMS TO REPORT IMMEDIATELY:  Following lower endoscopy (colonoscopy or flexible sigmoidoscopy):  Excessive amounts of blood in the stool  Significant tenderness or worsening of abdominal pains  Swelling of the abdomen that is new, acute  Fever of 100F or higher  Following upper endoscopy (EGD)  Vomiting of blood or coffee ground material  New chest pain or pain under the shoulder blades  Painful or persistently difficult swallowing  New shortness of breath  Fever of 100F or higher  Black, tarry-looking stools  Resume previous diet Continue present medications Await pathology results Start omeprazole  - 20 mg by mouth daily If still problems, would recommend barium swallow with barium tablet followed by ENT  consultation Handouts on polyps and hemorrhoids given  For urgent or emergent issues, a gastroenterologist can be reached at any hour by calling [phone]. Do not use MyChart messaging for urgent concerns.    DIET:  We do recommend a small meal at first, but then you may proceed to your regular diet.  Drink plenty of fluids but you should avoid alcoholic beverages for 24 hours.  ACTIVITY:  You should plan to take it easy for the rest of today and you should NOT DRIVE or use heavy machinery until tomorrow (because of the sedation medicines used during the test).    FOLLOW UP: Our staff will call the number listed on your records the next business day following your procedure.  We will call around 7:15- 8:00 am to check on you and address any questions or concerns that you may have regarding the information given to you following your procedure. If we do not reach you, we will leave a message.     If any biopsies were taken you will be contacted by phone or by letter within the next 1-3 weeks.  Please call us  at [phone] if you have not heard about the biopsies in 3 weeks.    SIGNATURES/CONFIDENTIALITY: You and/or your care partner have signed paperwork which will be entered into your electronic medical record.  These signatures attest to the fact that that the information above on your After Visit Summary has been reviewed and is understood.  Full responsibility of the confidentiality of this discharge  information lies with you and/or your care-partner.

## 2024-04-11 NOTE — Progress Notes (Signed)
 "   Chief Complaint:colonoscopy, constipation, fatigue Primary GI Doctor: Dr. Charlanne   HPI:  Patient is a  69  year old female patient with past medical history of CAD, chronic pain, anxiety,GERD with eso dysphagia, who was self referred to me for a evaluation of colonoscopy, constipation, fatigue .     08/29/23 seen by cardiology for chest pain. Imdur  increased. Reviewed entire note.   Interval History Patient presents for evaluation of several gastrointestinal complaints and to discuss EGD/colon.   Patient reports having chronic constipation since giving birth to her first child.  Patient is currently using over-the-counter MiraLAX as needed.  Patient reports that she uses it every other day or daily it causes too much diarrhea.  Patient reports over the last 2 weeks she has not been taking any laxatives due to having pasty loose stool.  Patient reports she will wake up first thing in the morning to go to the restroom and pass stool without even knowing.  Patient has had intermittent abdominal discomfort in the lower region with severe bloating after eating.  No blood in stool.   Reports she just had abdominal xray in [place] approx 2 mths ago? and showed mild constipation.    Patient taking Morphine  MS Contin  TID for chronic pain.   Patient also complains of history of esophageal dysphagia.  Patient reports this has been going on for quite some time.  Patient reports she will have pills that get lodged in the back of her throat as well as choking on foods such as meat or solid.  Patient denies having any GERD symptoms.  Patient has taken PPI therapy in the past and denies this improving the dysphagia.  Patient has had swallow study in the past and worked with speech which she states also did not improve her current symptoms. At the time EGD was recommended however due to recent cardiac issues it was not completed.    Patient has a lot of fatigue.    Nonsmoker. No alcohol use.    Patient  currently taking baby Asa 81mg  po daily    Patients first colonoscopy was at age 9 with Dr. Towana and normal per patient.    Surgical history: multiple as listed below   Patient's family history includes: Breast cancer in her mother;  Colon polyps in her mother, father with esophageal strictures requiring dilatation   Patient having procedure on Jan 1/6 with urology for PNE, she has had issues with urinary incontinence.       Wt Readings from Last 3 Encounters:  02/23/24 143 lb (64.9 kg)  08/29/23 138 lb (62.6 kg)  02/15/23 129 lb (58.5 kg)        Past Medical History:  Diagnosis Date   Anxiety     Arthritis     Atypical chest pain 08/23/2018   Basal cell carcinoma     Cervical cancer (HCC)     Chronic pain 11/02/2017   Cigarette smoker 11/27/2017   Coronary artery disease 50% mid LAD and mid circumflex on cardiac catheterization 2018 11/15/2018   COVID-19 virus infection 08/30/2018   Dyspnea on exertion 08/23/2018   Hardware complicating wound infection 10/17/2022   Heart attack (HCC) 10/2017   Infection due to 2019 novel coronavirus 08/28/2018    Formatting of this note might be different from the original. Notification of POS result Isolation since day of testing of patient and family members Local Health Dept will call with specific instructions Conv Care their POC until the [facility]  contacts them Obtaining testing for symptomatic Family Members Discuss self-isolation measures Discuss in-home separation and safety measures Discuss web-based C   Kidney stone     Mass of right axilla 08/04/2015   Neck pain 10/11/2011   Odontogenic infection of jaw     Ovarian cyst     Personal history of malignant neoplasm of nasal cavities, middle ear, and accessory sinuses 11/10/2011   Pneumonia     Skin cancer (melanoma) (HCC)     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     Stress incontinence     Takotsubo cardiomyopathy     Tobacco use     UTI (urinary tract infection)                  Past Surgical History:  Procedure Laterality Date   ABDOMINAL HYSTERECTOMY        cervical cancer   CARPAL TUNNEL RELEASE Right     CERVICAL DISC SURGERY        x 3   INGUINAL HERNIA REPAIR Left     LEFT HEART CATH AND CORONARY ANGIOGRAPHY N/A 10/30/2017    Procedure: LEFT HEART CATH AND CORONARY ANGIOGRAPHY;  Surgeon: Court Dorn PARAS, MD;  Location: [facility];  Service: Cardiovascular;  Laterality: N/A;   LUMBAR FUSION       MANDIBLE SURGERY        lower jaw   MELANOMA EXCISION        L arm and L leg, 2014   muscle flap surgery        between shoulder blades   NOSE SURGERY   2012    Lost her nose due to cancer   SKIN CANCER EXCISION        squamous cell cancer , 2012   TRIGGER FINGER RELEASE Right      thumb   UMBILICAL HERNIA REPAIR       WRIST SURGERY        bone fragments                Current Outpatient Medications  Medication Sig Dispense Refill   ALPRAZolam  (XANAX ) 0.25 MG tablet Take 0.25 mg by mouth at bedtime as needed for anxiety.        amLODipine  (NORVASC ) 2.5 MG tablet Take 1 tablet (2.5 mg total) by mouth daily. 90 tablet 1   amoxicillin -clavulanate (AUGMENTIN ) 875-125 MG tablet Take 1 tablet by mouth 2 (two) times daily. 60 tablet 11   aspirin  EC 81 MG tablet Take 81 mg by mouth daily.       atorvastatin  (LIPITOR ) 80 MG tablet Take 1 tablet (80 mg total) by mouth daily. 90 tablet 1   estradiol (ESTRACE) 1 MG tablet Take 1.5 mg by mouth daily.       fesoterodine (TOVIAZ) 8 MG TB24 tablet Take 8 mg by mouth daily.       fluorouracil (EFUDEX) 5 % cream Apply 1 application  topically as needed (pre screening cancer).       furosemide  (LASIX ) 20 MG tablet TAKE 1 TABLET (20 MG TOTAL) BY MOUTH AS NEEDED FOR FLUID. 90 tablet 0   isosorbide  mononitrate (IMDUR ) 60 MG 24 hr tablet Take 1 tablet (60 mg total) by mouth daily. 90 tablet 3   losartan  (COZAAR ) 25 MG tablet TAKE 1 TABLET (25 MG TOTAL) BY MOUTH TWO (2) TIMES DAILY. 180 tablet 3   morphine  (MS  CONTIN) 30 MG 12 hr tablet Take 15 mg by mouth 2 (  two) times daily.       mupirocin ointment (BACTROBAN) 2 % Apply 1 Application topically daily.       Na Sulfate-K Sulfate-Mg Sulfate concentrate (SUPREP) 17.5-3.13-1.6 GM/177ML SOLN Take 1 kit (354 mLs total) by mouth once for 1 dose. 354 mL 0   nitroGLYCERIN  (NITROSTAT ) 0.4 MG SL tablet Place 1 tablet (0.4 mg total) under the tongue every 5 (five) minutes as needed for chest pain. 25 tablet 10   polyethylene glycol (MIRALAX / GLYCOLAX) packet Take 8.6 g by mouth as needed for mild constipation.       ranolazine  (RANEXA ) 500 MG 12 hr tablet Take 1 tablet (500 mg total) by mouth 2 (two) times daily. 180 tablet 2   solifenacin (VESICARE) 10 MG tablet Take 10 mg by mouth daily.       traZODone (DESYREL) 150 MG tablet Take 150 mg by mouth at bedtime.        Celecoxib (CELEBREX PO) Take 1 tablet by mouth 2 (two) times daily.       MYRBETRIQ 50 MG TB24 tablet Take 50 mg by mouth daily.          No current facility-administered medications for this visit.             Allergies as of 02/23/2024 - Review Complete 02/23/2024  Allergen Reaction Noted   Cephalosporins Rash 05/21/2013           Family History  Problem Relation Age of Onset   Breast cancer Mother          said all her sisters too   Colon polyps Mother     Throat cancer Mother          [place]   Thyroid  disease Mother     Heart disease Father     Diabetes Father     Kidney disease Father          stage 3   COPD Father     Heart attack Father     Hypertension Father     Macular degeneration Father     Blindness Father     Pancreatic cancer Father     Multiple sclerosis Sister     Macular degeneration Maternal Grandmother     Blindness Maternal Grandmother     Heart attack Paternal Grandfather     Macular degeneration Paternal Aunt          all aunts   Colon cancer Neg Hx     Esophageal cancer Neg Hx            Review of Systems:    Constitutional: No weight loss,  fever, chills, weakness or fatigue HEENT: Eyes: No change in vision               Ears, Nose, Throat:  No change in hearing or congestion Skin: No rash or itching Cardiovascular: No chest pain, chest pressure or palpitations   Respiratory: No SOB or cough Gastrointestinal: See HPI and otherwise negative Genitourinary: No dysuria or change in urinary frequency Neurological: No headache, dizziness or syncope Musculoskeletal: No new muscle or joint pain Hematologic: No bleeding or bruising Psychiatric: No history of depression or anxiety      Physical Exam:  Vital signs: BP 138/70 (BP Location: Left Arm, Patient Position: Sitting, Cuff Size: Normal)   Pulse 74   Ht 5' 2.5 (1.588 m) Comment: height measured without shoes  Wt 143 lb (64.9 kg)   LMP  (LMP Unknown)   BMI  25.74 kg/m    Constitutional:   Pleasant  female appears to be in NAD, Well developed, Well nourished, alert and cooperative Eyes:   PEERL, EOMI. No icterus. Conjunctiva pink. Neck:  Supple Throat: Oral cavity and pharynx without inflammation, swelling or lesion.  Respiratory: Respirations even and unlabored. Lungs clear to auscultation bilaterally.   No wheezes, crackles, or rhonchi.  Cardiovascular: Normal S1, S2. Regular rate and rhythm. No peripheral edema, cyanosis or pallor.  Gastrointestinal:  Soft, nondistended, nontender. No rebound or guarding. Normal bowel sounds. No appreciable masses or hepatomegaly. Rectal:  Not performed.  Msk:  Symmetrical without gross deformities. Without edema, no deformity or joint abnormality.  Neurologic:  Alert and  oriented x4;  grossly normal neurologically.  Skin:   Dry and intact without significant lesions or rashes.   RELEVANT LABS AND IMAGING: CBC     Latest Ref Rng & Units 02/15/2023    3:40 PM 10/17/2022    2:36 PM 07/05/2022    2:26 AM  CBC  WBC 3.8 - 10.8 Thousand/uL 5.9  6.5  5.9   Hemoglobin 11.7 - 15.5 g/dL 85.8  87.5  86.8   Hematocrit 35.0 - 45.0 % 40.9  36.4   38.3   Platelets 140 - 400 Thousand/uL 281  251  300       CMP         Latest Ref Rng & Units 02/15/2023    3:40 PM 10/17/2022    2:36 PM 07/05/2022    2:26 AM  CMP  Glucose 65 - 99 mg/dL 889  882  897   BUN 7 - 25 mg/dL 15  16  17    Creatinine 0.50 - 1.05 mg/dL 9.25  9.13  9.13   Sodium 135 - 146 mmol/L 139  140  140   Potassium 3.5 - 5.3 mmol/L 4.8  4.4  4.9   Chloride 98 - 110 mmol/L 103  104  105   CO2 20 - 32 mmol/L 30  31  29    Calcium  8.6 - 10.4 mg/dL 9.9  9.5  9.6   Total Protein 6.1 - 8.1 g/dL   6.3     Total Bilirubin 0.2 - 1.2 mg/dL   0.5     AST 10 - 35 U/L   17     ALT 6 - 29 U/L   15         Recent Labs       Lab Results  Component Value Date    TSH 1.240 06/11/2020     11/2022 echo- Left ventricular ejection fraction, by estimation, is 60 to 65%.    11/2018 MRI Abd MPRESSION: 1. No suspicious liver lesions identified at this time. Multiple nonenhancing T2 hyperintense structures involve both lobes of liver compatible with multiple cysts   12/2017 DG esophagus IMPRESSION: 1. Poor epiglottis turnover at least partially due to anterior osteophyte at C3-4, causing stasis of pill in the vallecula. This reproduced the patient's symptoms. 2. Suspect gastroesophageal reflux.  No esophageal stricture. 3. On the final images trace barium was seen at the level of the cords, attributed to nonvisualized laryngeal penetration.   01/16/2018 DG swallow with speech Pt presents with mild oropharyngeal dysphagia with decreased oral propulsion of solids/puree resulting in tongue base residuals that spill lower into pharynx.     In addition, decreased epiglottic deflection results in vallecular residuals post-swallow (worse with solids/pudding).  Chin tuck/head turn right/neck extension did not prevent accumulation at vallecular region.  Liquid swallows  and dry swallows *though effortful for pt* were helpful to reduce residuals to minimal.     Trace laryngeal penetration due  to decreased laryngeal elevation observed - but not to vocal cords and was only trace.  Barium tablet given with pudding which cleared well without significant residuals.     Using video, educated pt to findings/recommendations using teach back.  Pt states she does not drink liquids during meals - advised her to start consuming liquids t/o meal to decrease residuals.  In addition, advised pt to strengthen a forced gag as adequate tongue base retraction will expel solids/pudding from vallecular space.  Uncertain to source of pt's dysphagia?  Of note, at one point, pt sensed residuals in pharynx when her pharynx appearead clear of barium - ? referrant?  Suspect multifactorial dysphagia present.  Provided pt with written precautions and using teach back, education reinforced.  Thanks for this consult.     Assessment/Plan:     Encounter Diagnoses  Name Primary?   Special screening for malignant neoplasms, colon Yes   Altered bowel habits     Fecal smearing     Abdominal pain, unspecified abdominal location     Gastroesophageal reflux disease, unspecified whether esophagitis present     Dysphagia, unspecified type     B12 deficiency     History of skin cancer     Chronic pain syndrome     Other fatigue      69 year old female patient that presents with altered bowel habits, fecal smearing, abdominal pain, and eso dysphagia.   #1 Colon screening colonoscopy, last colonoscopy 18 years ago - Schedule for a colonoscopy in [facility] with Dr. Charlanne. The risks and benefits of colonoscopy with possible polypectomy / biopsies were discussed and the patient agrees to proceed.  -request records from Dr. Towana   #2 History of Constipation,  #3 on chronic opioids  #4 Altered BM's, currently loose and pasty - abdominal xray 2 view r/o obstipation -bowel purge day #1  - then restart Miralax po dialy, titrate as needed   #5 H/O GERD with #6 esophageal dysphagia , not on PPI. Denies GERD symptoms. MBSS with  uncertain source of dysphagia (01/2028) per Speech report #7 Hx of basal cell and squamous carcinoma of nasal cavity, sinus, accessory muscle - s/p surgery- no radiation per pt -Schedule EGD in [facility] with poss dilatation with Dr. Charlanne. The risks and benefits of EGD with possible biopsies and esophageal dilation were discussed with the patient who agrees to proceed.   #8 H/O NSTEMI 10/30/2017: d/t demand ischemia d/t stress induced cardiomyopathy (Takotsubo syndrome). EF 60-65% by Echo. Followed by Dr. Krasowski.   #9 Elevated MCV and MCH, chronic. Hgb WNL. Noovert bleeding #10 Fatigue -Check  b 12 / folate level   Thank you for the courtesy of this consult. Please call me with any questions or concerns.    Deanna May, FNP-C [facility]   Attending physician's note   I have taken history, reviewed the chart and examined the patient. I performed a substantive portion of this encounter, including complete performance of at least one of the key components, in conjunction with the APP. I agree with the Advanced Practitioner's note, impression and recommendations.   For EGD with possible dil/colon today   Anselm Charlanne, MD [facility] [phone]  "

## 2024-04-11 NOTE — Progress Notes (Signed)
 Report given to PACU, vss

## 2024-04-11 NOTE — Op Note (Signed)
 [facility] Patient Name: Kathryn [name] Procedure Date: 04/11/2024 2:01 PM MRN: [id number] Endoscopist: Lynnie Bring , MD, [id number] Age: 69 Referring MD:  Date of Birth: 1955/09/09 Gender: Female Account #: [id number] Procedure:                Upper GI endoscopy Indications:              Dysphagia Medicines:                Monitored Anesthesia Care Procedure:                Pre-Anesthesia Assessment:                           - Prior to the procedure, a History and Physical                            was performed, and patient medications and                            allergies were reviewed. The patient's tolerance of                            previous anesthesia was also reviewed. The risks                            and benefits of the procedure and the sedation                            options and risks were discussed with the patient.                            All questions were answered, and informed consent                            was obtained. Prior Anticoagulants: The patient has                            taken no anticoagulant or antiplatelet agents. ASA                            Grade Assessment: II - A patient with mild systemic                            disease. After reviewing the risks and benefits,                            the patient was deemed in satisfactory condition to                            undergo the procedure.                           After obtaining informed consent, the endoscope was  passed under direct vision. Throughout the                            procedure, the patient's blood pressure, pulse, and                            oxygen saturations were monitored continuously. The                            Olympus Scope SN [id number] was introduced through the                            mouth, and advanced to the second part of duodenum.                            The upper GI endoscopy was accomplished  without                            difficulty. The patient tolerated the procedure                            well. Scope In: Scope Out: Findings:                 The examined esophagus was moderately tortuous.                            Biopsies were obtained from the proximal and distal                            esophagus with cold forceps for histology of                            suspected eosinophilic esophagitis.                           A single 8 mm polyp with no bleeding was found 26                            cm from the incisors. The polyp was removed with a                            cold snare. Resection and retrieval were complete.                           One benign-appearing, intrinsic mild stenosis was                            found 38 cm from the incisors. This stenosis                            measured 1.4 cm (inner diameter). The stenosis was  traversed. A TTS dilator was passed through the                            scope. Dilation with a 15-16.5-18 mm balloon                            dilator was performed to 18 mm.                           Localized mild inflammation characterized by                            congestion (edema) and erythema was found in the                            gastric antrum. Biopsies were taken with a cold                            forceps for histology.                           The examined duodenum was normal. Biopsies for                            histology were taken with a cold forceps for                            evaluation of celiac disease. Complications:            No immediate complications. Estimated Blood Loss:     Estimated blood loss: none. Impression:               - Esophageal polyp. Resected and retrieved.                           - Benign-appearing esophageal stenosis. Dilated.                           - Gastritis. Biopsied. Recommendation:           - Patient has a contact  number available for                            emergencies. The signs and symptoms of potential                            delayed complications were discussed with the                            patient. Return to normal activities tomorrow.                            Written discharge instructions were provided to the                            patient.                           -  Resume previous diet.                           - Start omeprazole  20 mg p.o. daily #90. 4RF                           - Await pathology results.                           - If still problems, would recommend Barium swallow                            with barium tablet followed by ENT consultation.                           - The findings and recommendations were discussed                            with the patient's family. Lynnie Bring, MD 04/11/2024 2:53:26 PM This report has been signed electronically.

## 2024-04-11 NOTE — Op Note (Signed)
 [facility] Patient Name: Kathryn [name] Procedure Date: 04/11/2024 2:00 PM MRN: [id number] Endoscopist: Lynnie Bring , MD, [id number] Age: 69 Referring MD:  Date of Birth: [birth year] Gender: Female Account #: [id number] Procedure:                Colonoscopy Indications:              Screening for colorectal malignant neoplasm Medicines:                Monitored Anesthesia Care Procedure:                Pre-Anesthesia Assessment:                           - Prior to the procedure, a History and Physical                            was performed, and patient medications and                            allergies were reviewed. The patient's tolerance of                            previous anesthesia was also reviewed. The risks                            and benefits of the procedure and the sedation                            options and risks were discussed with the patient.                            All questions were answered, and informed consent                            was obtained. Prior Anticoagulants: The patient has                            taken no anticoagulant or antiplatelet agents. ASA                            Grade Assessment: II - A patient with mild systemic                            disease. After reviewing the risks and benefits,                            the patient was deemed in satisfactory condition to                            undergo the procedure.                           After obtaining informed consent, the colonoscope  was passed under direct vision. Throughout the                            procedure, the patient's blood pressure, pulse, and                            oxygen saturations were monitored continuously. The                            Olympus Scope [id number] was introduced through the                            anus and advanced to the the cecum, identified by                            appendiceal  orifice and ileocecal valve. The                            colonoscopy was somewhat difficult due to a                            tortuous colon. Successful completion of the                            procedure was aided by applying abdominal pressure.                            The patient tolerated the procedure well. The                            quality of the bowel preparation was good. The                            ileocecal valve, appendiceal orifice, and rectum                            were photographed. Scope In: 2:24:35 PM Scope Out: 2:48:49 PM Scope Withdrawal Time: 0 hours 15 minutes 11 seconds  Total Procedure Duration: 0 hours 24 minutes 14 seconds  Findings:                 Two sessile polyps were found in the proximal                            ascending colon. The polyps were 2 to 3 mm in size.                            These polyps were removed with a cold biopsy                            forceps. Resection and retrieval were complete.                           Two sessile polyps were found  in the mid sigmoid                            colon and distal sigmoid colon. The polyps were 2                            to 5 mm in size. Smaller polyp was removed by cold                            biopsy forceps and the larger with a cold snare.                            Resection and retrieval were complete.                           Non-bleeding internal hemorrhoids were found during                            retroflexion. The hemorrhoids were small and Grade                            I (internal hemorrhoids that do not prolapse).                           The exam was otherwise without abnormality on                            direct and retroflexion views. Complications:            No immediate complications. Estimated Blood Loss:     Estimated blood loss: none. Impression:               - Two 2 to 3 mm polyps in the proximal ascending                             colon, removed with a cold biopsy forceps. Resected                            and retrieved.                           - Two 2 to 5 mm polyps in the mid sigmoid colon and                            in the distal sigmoid colon, removed with a cold                            snare and Bx forceps. Resected and retrieved.                           - Non-bleeding internal hemorrhoids.                           - The examination was otherwise normal on direct  and retroflexion views. The colon was highly                            torturous. Recommendation:           - Patient has a contact number available for                            emergencies. The signs and symptoms of potential                            delayed complications were discussed with the                            patient. Return to normal activities tomorrow.                            Written discharge instructions were provided to the                            patient.                           - Resume previous diet.                           - Continue present medications.                           - Await pathology results.                           - Repeat colonoscopy for surveillance based on                            pathology results.                           - The findings and recommendations were discussed                            with the patient's family. Lynnie Bring, MD 04/11/2024 2:57:19 PM This report has been signed electronically.

## 2024-04-12 ENCOUNTER — Telehealth: Payer: Self-pay | Admitting: *Deleted

## 2024-04-12 NOTE — Telephone Encounter (Signed)
" °  Follow up Call-     04/11/2024   12:49 PM  Call back number  Post procedure Call Back phone  # [phone]  Permission to leave phone message Yes     Patient questions:  Do you have a fever, pain , or abdominal swelling? No. Pain Score  0 *  Have you tolerated food without any problems? Yes.    Have you been able to return to your normal activities? Yes.    Do you have any questions about your discharge instructions: Diet   No. Medications  No. Follow up visit  No.  Do you have questions or concerns about your Care? No.  Actions: * If pain score is 4 or above: No action needed, pain <4.   "

## 2024-04-14 NOTE — Progress Notes (Unsigned)
 "  Subjective:  Complaint follow-up for mandibular osteomyelitis    Patient ID: Kathryn [name], female    DOB: 03-Aug-1955, 69 y.o.   MRN: [id number]  HPI   69 year old with left mandibular surgical placement of a dental implant on 02/08/2022.  She initially did well, however, presented on 03/11/2022 with left facial swelling and pain.  The dental implant had become infected and was removed.  She healed well, however, presented again to her dentist on 05/16/2022 with recurrent facial swelling and mandibular pain.  A CT scan revealed findings consistent with osteomyelitis of the left mandible with associated possible pathologic fracture.  She was prescribed clindamycin 300 mg 3 times daily.  However, she had minimal improvement in her symptoms.  She underwent another debridement of the area with fixation of the fracture on 05/24/2022 he had a metal plate placed at time of debridement. .  Cultures obtained from the left mandible notable for rare Staphylococcus epidermidis (oxacillin sensitive) and rare Streptococcus intermedius notable to all antibiotics except for erythromycin. She was treated with amoxicillin ,  followed by Augmentin  and follow-up with my former partner Dr. Prentiss.  She seemed respond well to antibiotics.  Her inflammatory markers were notably normal prior to initiation of antibiotics.   She says at last visit that she does not have any pain anymore she does have a knot on the corner of her jaw that was where this area of osteomyelitis was quite profoundly enlarged and photos that she showed me showed me previously.  Her ENT physician told her that he believes that this area is due to excessive calcium  deposits that have occurred.  She is in need of further surgery to stabilize her jaw and this is going to be planned for January she is tolerating the Augmentin  without difficulty.    Past Medical History:  Diagnosis Date   Anxiety    Arthritis    Atypical chest pain 08/23/2018    Basal cell carcinoma    Cervical cancer (HCC)    Chronic pain 11/02/2017   Cigarette smoker 11/27/2017   Coronary artery disease 50% mid LAD and mid circumflex on cardiac catheterization 2018 11/15/2018   COVID-19 virus infection 08/30/2018   Dyspnea on exertion 08/23/2018   Hardware complicating wound infection 10/17/2022   Heart attack (HCC) 10/2017   Infection due to 2019 novel coronavirus 08/28/2018   Formatting of this note might be different from the original. Notification of POS result Isolation since day of testing of patient and family members Local Health Dept will call with specific instructions Conv Care their POC until the [facility] contacts them Obtaining testing for symptomatic Family Members Discuss self-isolation measures Discuss in-home separation and safety measures Discuss web-based C   Kidney stone    Mass of right axilla 08/04/2015   Neck pain 10/11/2011   Odontogenic infection of jaw    Ovarian cyst    Personal history of malignant neoplasm of nasal cavities, middle ear, and accessory sinuses 11/10/2011   Pneumonia    Skin cancer (melanoma) (HCC)    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    Stress incontinence    Takotsubo cardiomyopathy    Tobacco use    UTI (urinary tract infection)     Past Surgical History:  Procedure Laterality Date   ABDOMINAL HYSTERECTOMY     cervical cancer   CARPAL TUNNEL RELEASE Right    CERVICAL DISC SURGERY     x 3   INGUINAL HERNIA REPAIR Left  LEFT HEART CATH AND CORONARY ANGIOGRAPHY N/A 10/30/2017   Procedure: LEFT HEART CATH AND CORONARY ANGIOGRAPHY;  Surgeon: Court Dorn PARAS, MD;  Location: [facility];  Service: Cardiovascular;  Laterality: N/A;   LUMBAR FUSION     MANDIBLE SURGERY     lower jaw   MELANOMA EXCISION     L arm and L leg, 2014   muscle flap surgery     between shoulder blades   NOSE SURGERY  2012   Lost her nose due to cancer   SKIN CANCER EXCISION     squamous cell cancer , 2012   TRIGGER FINGER  RELEASE Right    thumb   UMBILICAL HERNIA REPAIR     WRIST SURGERY     bone fragments    Family History  Problem Relation Age of Onset   Breast cancer Mother        said all her sisters too   Colon polyps Mother    Throat cancer Mother        [place]   Thyroid  disease Mother    Heart disease Father    Diabetes Father    Kidney disease Father        stage 3   COPD Father    Heart attack Father    Hypertension Father    Macular degeneration Father    Blindness Father    Pancreatic cancer Father    Multiple sclerosis Sister    Macular degeneration Paternal Aunt        all aunts   Macular degeneration Maternal Grandmother    Blindness Maternal Grandmother    Heart attack Paternal Grandfather    Colon cancer Neg Hx    Esophageal cancer Neg Hx    Stomach cancer Neg Hx    Rectal cancer Neg Hx       Social History   Socioeconomic History   Marital status: Married    Spouse name: Not on file   Number of children: 2   Years of education: Not on file   Highest education level: Not on file  Occupational History   Not on file  Tobacco Use   Smoking status: Some Days    Current packs/day: 0.25    Average packs/day: 0.3 packs/day for 40.0 years (10.0 ttl pk-yrs)    Types: Cigarettes   Smokeless tobacco: Never  Vaping Use   Vaping status: Never Used  Substance and Sexual Activity   Alcohol use: No   Drug use: No   Sexual activity: Not on file  Other Topics Concern   Not on file  Social History Narrative   Not on file   Social Drivers of Health   Tobacco Use: High Risk (04/11/2024)   Patient History    Smoking Tobacco Use: Some Days    Smokeless Tobacco Use: Never    Passive Exposure: Not on file  Financial Resource Strain: Not on file  Food Insecurity: Not on file  Transportation Needs: Not on file  Physical Activity: Not on file  Stress: Not on file  Social Connections: Not on file  Depression (PHQ2-9): Low Risk (02/15/2023)   Depression (PHQ2-9)    PHQ-2  Score: 0  Alcohol Screen: Not on file  Housing: Not on file  Utilities: Not on file  Health Literacy: Not on file    Allergies  Allergen Reactions   Cephalosporins Rash     Current Outpatient Medications:    ALPRAZolam  (XANAX ) 0.25 MG tablet, Take 0.25 mg by mouth  at bedtime as needed for anxiety. , Disp: , Rfl:    amLODipine  (NORVASC ) 2.5 MG tablet, Take 1 tablet (2.5 mg total) by mouth daily., Disp: 90 tablet, Rfl: 1   amoxicillin -clavulanate (AUGMENTIN ) 875-125 MG tablet, Take 1 tablet by mouth 2 (two) times daily., Disp: 60 tablet, Rfl: 11   aspirin  EC 81 MG tablet, Take 81 mg by mouth daily., Disp: , Rfl:    atorvastatin  (LIPITOR ) 80 MG tablet, Take 1 tablet (80 mg total) by mouth daily., Disp: 90 tablet, Rfl: 1   Celecoxib (CELEBREX PO), Take 1 tablet by mouth 2 (two) times daily., Disp: , Rfl:    estradiol (ESTRACE) 1 MG tablet, Take 1.5 mg by mouth daily., Disp: , Rfl:    fesoterodine (TOVIAZ) 8 MG TB24 tablet, Take 8 mg by mouth daily. (Patient not taking: Reported on 04/11/2024), Disp: , Rfl:    fluorouracil (EFUDEX) 5 % cream, Apply 1 application  topically as needed (pre screening cancer). (Patient not taking: Reported on 04/11/2024), Disp: , Rfl:    furosemide  (LASIX ) 20 MG tablet, TAKE 1 TABLET (20 MG TOTAL) BY MOUTH AS NEEDED FOR FLUID., Disp: 90 tablet, Rfl: 0   isosorbide  mononitrate (IMDUR ) 30 MG 24 hr tablet, Take 30 mg by mouth daily., Disp: , Rfl:    isosorbide  mononitrate (IMDUR ) 60 MG 24 hr tablet, Take 1 tablet (60 mg total) by mouth daily., Disp: 90 tablet, Rfl: 3   losartan  (COZAAR ) 25 MG tablet, TAKE 1 TABLET (25 MG TOTAL) BY MOUTH TWO (2) TIMES DAILY., Disp: 180 tablet, Rfl: 3   meloxicam (MOBIC) 15 MG tablet, Take 15 mg by mouth daily., Disp: , Rfl:    morphine  (MS CONTIN ) 30 MG 12 hr tablet, Take 15 mg by mouth 2 (two) times daily., Disp: , Rfl:    mupirocin ointment (BACTROBAN) 2 %, Apply 1 Application topically daily. (Patient not taking: Reported on  04/11/2024), Disp: , Rfl:    MYRBETRIQ 50 MG TB24 tablet, Take 50 mg by mouth daily., Disp: , Rfl:    nitroGLYCERIN  (NITROSTAT ) 0.4 MG SL tablet, Place 1 tablet (0.4 mg total) under the tongue every 5 (five) minutes as needed for chest pain., Disp: 25 tablet, Rfl: 10   omeprazole  (PRILOSEC) 20 MG capsule, Take 1 capsule (20 mg total) by mouth daily., Disp: 90 capsule, Rfl: 0   ranolazine  (RANEXA ) 500 MG 12 hr tablet, Take 1 tablet (500 mg total) by mouth 2 (two) times daily., Disp: 180 tablet, Rfl: 1   solifenacin (VESICARE) 10 MG tablet, Take 10 mg by mouth daily., Disp: , Rfl:    traZODone (DESYREL) 150 MG tablet, Take 150 mg by mouth at bedtime. , Disp: , Rfl:     Review of Systems  Constitutional:  Negative for activity change, appetite change, chills, diaphoresis, fatigue, fever and unexpected weight change.  HENT:  Negative for congestion, rhinorrhea, sinus pressure, sneezing, sore throat and trouble swallowing.   Eyes:  Negative for photophobia and visual disturbance.  Respiratory:  Negative for cough, chest tightness, shortness of breath, wheezing and stridor.   Cardiovascular:  Negative for chest pain, palpitations and leg swelling.  Gastrointestinal:  Negative for abdominal distention, abdominal pain, anal bleeding, blood in stool, constipation, diarrhea, nausea and vomiting.  Genitourinary:  Negative for difficulty urinating, dysuria, flank pain and hematuria.  Musculoskeletal:  Negative for arthralgias, back pain, gait problem, joint swelling and myalgias.  Skin:  Negative for color change, pallor, rash and wound.  Neurological:  Negative for dizziness, tremors,  weakness and light-headedness.  Hematological:  Negative for adenopathy. Does not bruise/bleed easily.  Psychiatric/Behavioral:  Negative for agitation, behavioral problems, confusion, decreased concentration, dysphoric mood and sleep disturbance.        Objective:   Physical Exam Constitutional:      General: She is  not in acute distress.    Appearance: Normal appearance. She is well-developed. She is not ill-appearing or diaphoretic.  HENT:     Head: Normocephalic and atraumatic.     Right Ear: Hearing and external ear normal.     Left Ear: Hearing and external ear normal.     Nose: No nasal deformity or rhinorrhea.  Eyes:     General: No scleral icterus.    Conjunctiva/sclera: Conjunctivae normal.     Right eye: Right conjunctiva is not injected.     Left eye: Left conjunctiva is not injected.     Pupils: Pupils are equal, round, and reactive to light.  Neck:     Vascular: No JVD.     Comments: Sensation of lump on left side Cardiovascular:     Rate and Rhythm: Normal rate and regular rhythm.     Heart sounds: Normal heart sounds, S1 normal and S2 normal. No murmur heard.    No friction rub.  Abdominal:     General: Bowel sounds are normal. There is no distension.     Palpations: Abdomen is soft.     Tenderness: There is no abdominal tenderness.  Musculoskeletal:        General: Normal range of motion.     Right shoulder: Normal.     Left shoulder: Normal.     Cervical back: Normal range of motion and neck supple.     Right hip: Normal.     Left hip: Normal.     Right knee: Normal.     Left knee: Normal.  Lymphadenopathy:     Head:     Right side of head: No submandibular, preauricular or posterior auricular adenopathy.     Left side of head: No submandibular, preauricular or posterior auricular adenopathy.     Cervical: No cervical adenopathy.     Right cervical: No superficial or deep cervical adenopathy.    Left cervical: No superficial or deep cervical adenopathy.  Skin:    General: Skin is warm and dry.     Coloration: Skin is not pale.     Findings: No abrasion, bruising, ecchymosis, erythema, lesion or rash.     Nails: There is no clubbing.  Neurological:     Mental Status: She is alert and oriented to person, place, and time.     Sensory: No sensory deficit.      Coordination: Coordination normal.     Gait: Gait normal.  Psychiatric:        Attention and Perception: She is attentive.        Speech: Speech normal.        Behavior: Behavior normal. Behavior is cooperative.        Thought Content: Thought content normal.        Judgment: Judgment normal.           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  Will continue Augmentin  trying to push through into March.  Vaccine counseling recommended updated COVID and flu vaccinations  "

## 2024-04-17 LAB — SURGICAL PATHOLOGY

## 2024-04-18 ENCOUNTER — Ambulatory Visit: Admitting: Infectious Disease

## 2024-04-18 ENCOUNTER — Other Ambulatory Visit: Payer: Self-pay

## 2024-04-18 VITALS — Wt 144.0 lb

## 2024-04-18 DIAGNOSIS — M272 Inflammatory conditions of jaws: Secondary | ICD-10-CM | POA: Diagnosis not present

## 2024-04-19 LAB — CBC WITH DIFFERENTIAL/PLATELET
Absolute Lymphocytes: 2381 {cells}/uL (ref 850–3900)
Absolute Monocytes: 314 {cells}/uL (ref 200–950)
Basophils Absolute: 39 {cells}/uL (ref 0–200)
Basophils Relative: 0.8 %
Eosinophils Absolute: 152 {cells}/uL (ref 15–500)
Eosinophils Relative: 3.1 %
HCT: 41.9 % (ref 35.9–46.0)
Hemoglobin: 14 g/dL (ref 11.7–15.5)
MCH: 35.4 pg — ABNORMAL HIGH (ref 27.0–33.0)
MCHC: 33.4 g/dL (ref 31.6–35.4)
MCV: 105.8 fL — ABNORMAL HIGH (ref 81.4–101.7)
MPV: 10.3 fL (ref 7.5–12.5)
Monocytes Relative: 6.4 %
Neutro Abs: 2014 {cells}/uL (ref 1500–7800)
Neutrophils Relative %: 41.1 %
Platelets: 279 10*3/uL (ref 140–400)
RBC: 3.96 Million/uL (ref 3.80–5.10)
RDW: 11.7 % (ref 11.0–15.0)
Total Lymphocyte: 48.6 %
WBC: 4.9 10*3/uL (ref 3.8–10.8)

## 2024-04-19 LAB — BASIC METABOLIC PANEL WITHOUT GFR
BUN: 14 mg/dL (ref 7–25)
CO2: 32 mmol/L (ref 20–32)
Calcium: 9.7 mg/dL (ref 8.6–10.4)
Chloride: 105 mmol/L (ref 98–110)
Creat: 0.84 mg/dL (ref 0.50–1.05)
Glucose, Bld: 97 mg/dL (ref 65–99)
Potassium: 4.5 mmol/L (ref 3.5–5.3)
Sodium: 143 mmol/L (ref 135–146)

## 2024-04-19 LAB — SEDIMENTATION RATE: Sed Rate: 2 mm/h (ref 0–30)

## 2024-04-19 LAB — C-REACTIVE PROTEIN: CRP: <3 mg/L
